# Patient Record
Sex: Male | Born: 1965 | Hispanic: No | Smoking: Former smoker
Health system: Southern US, Community
[De-identification: ages and names within clinical notes are randomized; demographics above are authoritative.]

## PROBLEM LIST (undated history)

## (undated) DIAGNOSIS — R531 Weakness: Secondary | ICD-10-CM

## (undated) DIAGNOSIS — R262 Difficulty in walking, not elsewhere classified: Secondary | ICD-10-CM

## (undated) DIAGNOSIS — I10 Essential (primary) hypertension: Secondary | ICD-10-CM

## (undated) DIAGNOSIS — E785 Hyperlipidemia, unspecified: Secondary | ICD-10-CM

## (undated) DIAGNOSIS — R7303 Prediabetes: Secondary | ICD-10-CM

## (undated) DIAGNOSIS — I639 Cerebral infarction, unspecified: Secondary | ICD-10-CM

## (undated) HISTORY — DX: Hyperlipidemia, unspecified: E78.5

## (undated) HISTORY — DX: Weakness: R53.1

## (undated) HISTORY — DX: Cerebral infarction, unspecified: I63.9

## (undated) HISTORY — DX: Difficulty in walking, not elsewhere classified: R26.2

## (undated) HISTORY — PX: NO PAST SURGERIES: SHX2092

## (undated) HISTORY — DX: Essential (primary) hypertension: I10

---

## 2011-01-03 ENCOUNTER — Emergency Department (HOSPITAL_COMMUNITY)
Admission: EM | Admit: 2011-01-03 | Discharge: 2011-01-03 | Disposition: A | Discharge status: Home / Self Care | Payer: No Typology Code available for payment source | Attending: Emergency Medicine | Admitting: Emergency Medicine

## 2011-01-03 ENCOUNTER — Emergency Department (HOSPITAL_COMMUNITY): Payer: No Typology Code available for payment source

## 2011-01-03 DIAGNOSIS — S93609A Unspecified sprain of unspecified foot, initial encounter: Secondary | ICD-10-CM | POA: Insufficient documentation

## 2014-04-18 DIAGNOSIS — I639 Cerebral infarction, unspecified: Secondary | ICD-10-CM

## 2014-04-18 HISTORY — DX: Cerebral infarction, unspecified: I63.9

## 2015-10-25 ENCOUNTER — Inpatient Hospital Stay (HOSPITAL_COMMUNITY): Payer: Self-pay

## 2015-10-25 ENCOUNTER — Encounter (HOSPITAL_COMMUNITY): Payer: Self-pay | Admitting: Oncology

## 2015-10-25 ENCOUNTER — Inpatient Hospital Stay (HOSPITAL_COMMUNITY)
Admission: EM | Admit: 2015-10-25 | Discharge: 2015-10-27 | DRG: 065 | Disposition: A | Discharge status: Rehabilitation Facility | Payer: Self-pay | Attending: Internal Medicine | Admitting: Internal Medicine

## 2015-10-25 ENCOUNTER — Emergency Department (HOSPITAL_COMMUNITY): Payer: Self-pay

## 2015-10-25 DIAGNOSIS — E669 Obesity, unspecified: Secondary | ICD-10-CM | POA: Diagnosis present

## 2015-10-25 DIAGNOSIS — I10 Essential (primary) hypertension: Secondary | ICD-10-CM

## 2015-10-25 DIAGNOSIS — I639 Cerebral infarction, unspecified: Secondary | ICD-10-CM | POA: Insufficient documentation

## 2015-10-25 DIAGNOSIS — I63312 Cerebral infarction due to thrombosis of left middle cerebral artery: Secondary | ICD-10-CM

## 2015-10-25 DIAGNOSIS — R471 Dysarthria and anarthria: Secondary | ICD-10-CM | POA: Insufficient documentation

## 2015-10-25 DIAGNOSIS — Z8249 Family history of ischemic heart disease and other diseases of the circulatory system: Secondary | ICD-10-CM

## 2015-10-25 DIAGNOSIS — R7303 Prediabetes: Secondary | ICD-10-CM | POA: Insufficient documentation

## 2015-10-25 DIAGNOSIS — R29707 NIHSS score 7: Secondary | ICD-10-CM | POA: Diagnosis not present

## 2015-10-25 DIAGNOSIS — K5900 Constipation, unspecified: Secondary | ICD-10-CM | POA: Diagnosis present

## 2015-10-25 DIAGNOSIS — Z6831 Body mass index (BMI) 31.0-31.9, adult: Secondary | ICD-10-CM

## 2015-10-25 DIAGNOSIS — I6350 Cerebral infarction due to unspecified occlusion or stenosis of unspecified cerebral artery: Secondary | ICD-10-CM

## 2015-10-25 DIAGNOSIS — R29703 NIHSS score 3: Secondary | ICD-10-CM | POA: Diagnosis present

## 2015-10-25 DIAGNOSIS — R29810 Facial weakness: Secondary | ICD-10-CM | POA: Diagnosis present

## 2015-10-25 DIAGNOSIS — R7302 Impaired glucose tolerance (oral): Secondary | ICD-10-CM | POA: Diagnosis present

## 2015-10-25 DIAGNOSIS — I638 Other cerebral infarction: Principal | ICD-10-CM | POA: Diagnosis present

## 2015-10-25 DIAGNOSIS — E785 Hyperlipidemia, unspecified: Secondary | ICD-10-CM

## 2015-10-25 DIAGNOSIS — Z823 Family history of stroke: Secondary | ICD-10-CM

## 2015-10-25 DIAGNOSIS — G8191 Hemiplegia, unspecified affecting right dominant side: Secondary | ICD-10-CM | POA: Diagnosis present

## 2015-10-25 LAB — I-STAT CHEM 8, ED
BUN: 24 mg/dL — ABNORMAL HIGH (ref 6–20)
CHLORIDE: 104 mmol/L (ref 101–111)
Calcium, Ion: 1.13 mmol/L (ref 1.13–1.30)
Creatinine, Ser: 1.1 mg/dL (ref 0.61–1.24)
Glucose, Bld: 103 mg/dL — ABNORMAL HIGH (ref 65–99)
HEMATOCRIT: 49 % (ref 39.0–52.0)
HEMOGLOBIN: 16.7 g/dL (ref 13.0–17.0)
POTASSIUM: 3.8 mmol/L (ref 3.5–5.1)
Sodium: 140 mmol/L (ref 135–145)
TCO2: 25 mmol/L (ref 0–100)

## 2015-10-25 LAB — COMPREHENSIVE METABOLIC PANEL
ALBUMIN: 4.7 g/dL (ref 3.5–5.0)
ALK PHOS: 66 U/L (ref 38–126)
ALT: 45 U/L (ref 17–63)
ANION GAP: 7 (ref 5–15)
AST: 25 U/L (ref 15–41)
BUN: 23 mg/dL — ABNORMAL HIGH (ref 6–20)
CHLORIDE: 105 mmol/L (ref 101–111)
CO2: 26 mmol/L (ref 22–32)
CREATININE: 0.91 mg/dL (ref 0.61–1.24)
Calcium: 9.2 mg/dL (ref 8.9–10.3)
GFR calc non Af Amer: >60 mL/min (ref >60)
GLUCOSE: 105 mg/dL — AB (ref 65–99)
Potassium: 3.8 mmol/L (ref 3.5–5.1)
SODIUM: 138 mmol/L (ref 135–145)
Total Bilirubin: 0.7 mg/dL (ref 0.3–1.2)
Total Protein: 8 g/dL (ref 6.5–8.1)

## 2015-10-25 LAB — CBG MONITORING, ED: GLUCOSE-CAPILLARY: 99 mg/dL (ref 65–99)

## 2015-10-25 LAB — CBC
HCT: 45.2 % (ref 39.0–52.0)
Hemoglobin: 16.6 g/dL (ref 13.0–17.0)
MCH: 29.9 pg (ref 26.0–34.0)
MCHC: 36.7 g/dL — ABNORMAL HIGH (ref 30.0–36.0)
MCV: 81.3 fL (ref 78.0–100.0)
PLATELETS: 197 10*3/uL (ref 150–400)
RBC: 5.56 MIL/uL (ref 4.22–5.81)
RDW: 12.9 % (ref 11.5–15.5)
WBC: 8.5 10*3/uL (ref 4.0–10.5)

## 2015-10-25 LAB — DIFFERENTIAL
BASOS PCT: 0 %
Basophils Absolute: 0 10*3/uL (ref 0.0–0.1)
Eosinophils Absolute: 0.4 10*3/uL (ref 0.0–0.7)
Eosinophils Relative: 5 %
LYMPHS PCT: 24 %
Lymphs Abs: 2 10*3/uL (ref 0.7–4.0)
MONO ABS: 0.6 10*3/uL (ref 0.1–1.0)
Monocytes Relative: 7 %
NEUTROS ABS: 5.5 10*3/uL (ref 1.7–7.7)
NEUTROS PCT: 65 %

## 2015-10-25 LAB — APTT: APTT: 34 s (ref 24–37)

## 2015-10-25 LAB — RAPID URINE DRUG SCREEN, HOSP PERFORMED
AMPHETAMINES: NOT DETECTED
BARBITURATES: NOT DETECTED
BENZODIAZEPINES: NOT DETECTED
COCAINE: NOT DETECTED
Opiates: NOT DETECTED
TETRAHYDROCANNABINOL: NOT DETECTED

## 2015-10-25 LAB — LIPID PANEL
CHOLESTEROL: 269 mg/dL — AB (ref 0–200)
HDL: 39 mg/dL — ABNORMAL LOW (ref >40)
LDL CALC: 176 mg/dL — AB (ref 0–99)
Total CHOL/HDL Ratio: 6.9 RATIO
Triglycerides: 269 mg/dL — ABNORMAL HIGH (ref <150)
VLDL: 54 mg/dL — AB (ref 0–40)

## 2015-10-25 LAB — PROTIME-INR
INR: 0.93 (ref 0.00–1.49)
Prothrombin Time: 12.7 seconds (ref 11.6–15.2)

## 2015-10-25 LAB — I-STAT TROPONIN, ED: Troponin i, poc: 0 ng/mL (ref 0.00–0.08)

## 2015-10-25 MED ORDER — SENNOSIDES-DOCUSATE SODIUM 8.6-50 MG PO TABS: 1.0000 | ORAL_TABLET | Freq: Every evening | ORAL | Status: DC | PRN

## 2015-10-25 MED ORDER — LABETALOL HCL 5 MG/ML IV SOLN
20.0000 mg | Freq: Once | INTRAVENOUS | Status: AC
Start: 2015-10-25 — End: 2015-10-25
  Administered 2015-10-25: 20 mg via INTRAVENOUS
  Filled 2015-10-25: qty 4

## 2015-10-25 MED ORDER — ASPIRIN 325 MG PO TABS
325.0000 mg | ORAL_TABLET | Freq: Every day | ORAL | Status: DC
  Administered 2015-10-25 – 2015-10-27 (×3): 325 mg via ORAL
  Filled 2015-10-25 (×3): qty 1

## 2015-10-25 MED ORDER — LABETALOL HCL 5 MG/ML IV SOLN: 10.0000 mg | Freq: Four times a day (QID) | INTRAVENOUS | Status: DC | PRN

## 2015-10-25 MED ORDER — ATORVASTATIN CALCIUM 80 MG PO TABS
80.0000 mg | ORAL_TABLET | Freq: Every day | ORAL | Status: DC
  Administered 2015-10-26: 80 mg via ORAL
  Filled 2015-10-25: qty 1

## 2015-10-25 MED ORDER — ASPIRIN 300 MG RE SUPP: 300.0000 mg | Freq: Every day | RECTAL | Status: DC

## 2015-10-25 MED ORDER — ENOXAPARIN SODIUM 40 MG/0.4ML ~~LOC~~ SOLN
40.0000 mg | SUBCUTANEOUS | Status: DC
  Administered 2015-10-25 – 2015-10-27 (×3): 40 mg via SUBCUTANEOUS
  Filled 2015-10-25 (×3): qty 0.4

## 2015-10-25 MED ORDER — STROKE: EARLY STAGES OF RECOVERY BOOK
Freq: Once | Status: AC
  Administered 2015-10-26: 13:00:00
  Filled 2015-10-25: qty 1

## 2015-10-25 NOTE — Progress Notes (Signed)
PROGRESS NOTE  Brad Tran ZOX:[id number] DOB: [birth year] DOA: 10/25/2015 PCP: No primary care provider on file.  Brief History:  50 year old male with a history of hypertension presented with acute onset of gait instability, slurred speech and right-sided weakness, upper greater than lower extremity. The patient woke up around 11 PM on 10/24/2015 when he developed the above symptoms. The patient normally works third shift and gets up around that period of time. The patient went to work or his coworkers noted worsening symptoms. As a result, the patient was brought to emergency department for further evaluation. Neurology was consulted, and full stroke workup was undertaken.  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 -MRA brain--no large vessel occlusions -Carotid Duplex--no hemodynamically significant stenosis -Echo--pending  -LDL--176--start atorvastatin 80 mg daily -HbA1C--pending -continue aspirin  HTN -allow for permissive HTN -hydralazine for SBP >210 DBP >120 -UDS neg  Hyperlipidemia -LDL--176--start atorvastatin 80 mg daily   Disposition Plan:   Discharge in 1-2 days  Family Communication:   No Family at bedside  Consultants:  Neurology  Code Status:  FULL   DVT Prophylaxis:  [place] Lovenox  Procedures: As Listed in Progress Note Above  Antibiotics: None    Subjective: Patient states that his right upper extremity and resolution of her weakness is about the same ingestion. He feels his speech is a little bit better. He denies any headache, fever, chills, has become hoarse breath, nausea, vomiting, diarrhea. No abdominal pain. No hematochezia melena.  Objective: Filed Vitals:   10/25/15 0343 10/25/15 0551 10/25/15 0622 10/25/15 1200  BP: 198/117 180/102 167/107 168/105  Pulse: 85 86 84 77  Temp: 98.3 F  (36.8 C)  98.4 F (36.9 C)   TempSrc: Oral  Oral   Resp: 20 18 16 16   Height:   5\' 3"  (1.6 m)   Weight:   79.742 kg (175 lb 12.8 oz)   SpO2: 100% 97% 99% 97%    Intake/Output Summary (Last 24 hours) at 10/25/15 1350 Last data filed at 10/25/15 0924  Gross per 24 hour  Intake      0 ml  Output    500 ml  Net   -500 ml   Weight change:  Exam:   General:  Pt is alert, follows commands appropriately, not in acute distress  HEENT: No icterus, No thrush, No neck mass, [place]/AT  Cardiovascular: RRR, S1/S2, no rubs, no gallops  Respiratory: CTA bilaterally, no wheezing, no crackles, no rhonchi  Abdomen: Soft/+BS, non tender, non distended, no guarding  Extremities: trace LE edema, No lymphangitis, No petechiae, No rashes, no synovitis   Data Reviewed: I have personally reviewed following labs and imaging studies Basic Metabolic Panel:  Recent Labs Lab 10/25/15 0127 10/25/15 0140  NA 138 140  K 3.8 3.8  CL 105 104  CO2 26  --   GLUCOSE 105* 103*  BUN 23* 24*  CREATININE 0.91 1.10  CALCIUM 9.2  --    Liver Function Tests:  Recent Labs Lab 10/25/15 0127  AST 25  ALT 45  ALKPHOS 66  BILITOT 0.7  PROT 8.0  ALBUMIN 4.7   No results for input(s): LIPASE, AMYLASE in the last 168 hours. No results for input(s): AMMONIA in the last 168 hours. Coagulation Profile:  Recent Labs Lab 10/25/15 0127  INR 0.93   CBC:  Recent Labs Lab 10/25/15 0127  10/25/15 0140  WBC 8.5  --   NEUTROABS 5.5  --   HGB 16.6 16.7  HCT 45.2 49.0  MCV 81.3  --   PLT 197  --    Cardiac Enzymes: No results for input(s): CKTOTAL, CKMB, CKMBINDEX, TROPONINI in the last 168 hours. BNP: Invalid input(s): POCBNP CBG:  Recent Labs Lab 10/25/15 0128  GLUCAP 99   HbA1C: No results for input(s): HGBA1C in the last 72 hours. Urine analysis: No results found for: COLORURINE, APPEARANCEUR, LABSPEC, PHURINE, GLUCOSEU, HGBUR, BILIRUBINUR, KETONESUR, PROTEINUR, UROBILINOGEN, NITRITE,  LEUKOCYTESUR Sepsis Labs: @LABRCNTIP (procalcitonin:4,lacticidven:4) )No results found for this or any previous visit (from the past 240 hour(s)).   Scheduled Meds: .  stroke: mapping our early stages of recovery book   Does not apply Once  . aspirin  300 mg Rectal Daily   Or  . aspirin  325 mg Oral Daily  . atorvastatin  80 mg Oral q1800  . enoxaparin (LOVENOX) injection  40 mg Subcutaneous Q24H   Continuous Infusions:   Procedures/Studies: Mr Brain Wo Contrast  10/25/2015  CLINICAL DATA:  Dysarthria, difficulty walking beginning today. Feeling unwell. Assess ischemic stroke. EXAM: MRI HEAD WITHOUT CONTRAST MRA HEAD WITHOUT CONTRAST TECHNIQUE: Multiplanar, multiecho pulse sequences of the brain and surrounding structures were obtained without intravenous contrast. Angiographic images of the head were obtained using MRA technique without contrast. COMPARISON:  None. CT HEAD October 25, 2015 at 0136 hours FINDINGS: MRI HEAD FINDINGS INTRACRANIAL CONTENTS: 11 x 32 mm (transverse by AP) reduced diffusion LEFT posterior caudate body and tail and, posterior LEFT putaminal with corresponding low ADC values. No susceptibility artifact to suggest hemorrhage. The ventricles and sulci are normal for patient's age. 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 No suspicious parenchymal signal, masses or mass effect. No abnormal extra-axial fluid collections. No extra-axial masses though, contrast enhanced sequences would be more sensitive. Normal major intracranial vascular flow voids present at skull base. ORBITS: The included ocular globes and orbital contents are non-suspicious. SINUSES: RIGHT maxillary mucosal retention cyst. Minimal paranasal sinus mucosal thickening. Mastoid air cells are well aerated. SKULL/SOFT TISSUES: No abnormal sellar expansion. No suspicious calvarial bone marrow signal. Craniocervical junction maintained. MRA HEAD FINDINGS ANTERIOR CIRCULATION: Normal flow related enhancement of the included  cervical, petrous, cavernous and supraclinoid internal carotid arteries. Patent anterior communicating artery. Bilateral anterior cerebral arteries arise from RIGHT A1-2 junction with tiny LEFT A1 segment. No large vessel occlusion, high-grade stenosis, aneurysm. Mild luminal irregularity anterior and middle cerebral arteries. POSTERIOR CIRCULATION: Codominant vertebral artery's. Basilar artery is patent, with normal flow related enhancement of the main branch vessels. Fenestrated proximal basilar artery. Fetal origin RIGHT posterior cerebral artery. Robust LEFT posterior communicating artery. Normal flow related enhancement of the posterior cerebral arteries. No large vessel occlusion, high-grade stenosis, aneurysm. Mild luminal irregularity of the posterior cerebral arteries. IMPRESSION: MRI HEAD: Acute LEFT basal ganglia nonhemorrhagic infarct. 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 MRA HEAD: No emergent large vessel occlusion or severe stenosis. Mild luminal irregularity of the cerebral arteries compatible with atherosclerosis. Electronically Signed   By: Awilda Metroourtnay  Bloomer M.D.   On: 10/25/2015 06:03   Mr Maxine GlennMra Head/brain Wo Cm  10/25/2015  CLINICAL DATA:  Dysarthria, difficulty walking beginning today. Feeling unwell. Assess ischemic stroke. EXAM: MRI HEAD WITHOUT CONTRAST MRA HEAD WITHOUT CONTRAST TECHNIQUE: Multiplanar, multiecho pulse sequences of the brain and surrounding structures were obtained without intravenous contrast. Angiographic images of the head were obtained using MRA technique without contrast. COMPARISON:  None. CT HEAD October 25, 2015  at 0136 hours FINDINGS: MRI HEAD FINDINGS INTRACRANIAL CONTENTS: 11 x 32 mm (transverse by AP) reduced diffusion LEFT posterior caudate body and tail and, posterior LEFT putaminal with corresponding low ADC values. No susceptibility artifact to suggest hemorrhage. The ventricles and sulci are normal for patient's age. 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 No suspicious  parenchymal signal, masses or mass effect. No abnormal extra-axial fluid collections. No extra-axial masses though, contrast enhanced sequences would be more sensitive. Normal major intracranial vascular flow voids present at skull base. ORBITS: The included ocular globes and orbital contents are non-suspicious. SINUSES: RIGHT maxillary mucosal retention cyst. Minimal paranasal sinus mucosal thickening. Mastoid air cells are well aerated. SKULL/SOFT TISSUES: No abnormal sellar expansion. No suspicious calvarial bone marrow signal. Craniocervical junction maintained. MRA HEAD FINDINGS ANTERIOR CIRCULATION: Normal flow related enhancement of the included cervical, petrous, cavernous and supraclinoid internal carotid arteries. Patent anterior communicating artery. Bilateral anterior cerebral arteries arise from RIGHT A1-2 junction with tiny LEFT A1 segment. No large vessel occlusion, high-grade stenosis, aneurysm. Mild luminal irregularity anterior and middle cerebral arteries. POSTERIOR CIRCULATION: Codominant vertebral artery's. Basilar artery is patent, with normal flow related enhancement of the main branch vessels. Fenestrated proximal basilar artery. Fetal origin RIGHT posterior cerebral artery. Robust LEFT posterior communicating artery. Normal flow related enhancement of the posterior cerebral arteries. No large vessel occlusion, high-grade stenosis, aneurysm. Mild luminal irregularity of the posterior cerebral arteries. IMPRESSION: MRI HEAD: Acute LEFT basal ganglia nonhemorrhagic infarct. 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 MRA HEAD: No emergent large vessel occlusion or severe stenosis. Mild luminal irregularity of the cerebral arteries compatible with atherosclerosis. Electronically Signed   By: Awilda Metro M.D.   On: 10/25/2015 06:03   Ct Head Code Stroke W/o Cm  10/25/2015  CLINICAL DATA:  Acute onset of slurred speech. Code stroke. Initial encounter. EXAM: CT HEAD WITHOUT CONTRAST TECHNIQUE:  Contiguous axial images were obtained from the base of the skull through the vertex without intravenous contrast. COMPARISON:  None. FINDINGS: There is no evidence of acute infarction, mass lesion, or intra- or extra-axial hemorrhage on CT. A small chronic infarct is noted at the left thalamus. The posterior fossa, including the cerebellum, brainstem and fourth ventricle, is within normal limits. The third and lateral ventricles are unremarkable in appearance. The cerebral hemispheres are symmetric in appearance, with normal gray-white differentiation. No mass effect or midline shift is seen. There is no evidence of fracture; visualized osseous structures are unremarkable in appearance. The orbits are within normal limits. A small mucus retention cyst or polyp is noted at the right maxillary sinus. The remaining paranasal sinuses and mastoid air cells are well-aerated. No significant soft tissue abnormalities are seen. IMPRESSION: 1. No acute intracranial pathology seen on CT. 2. 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 3. Small mucus retention cyst or polyp at the right maxillary sinus. These results were called by telephone at the time of interpretation on 10/25/2015 at 1:40 am to Dr. Azalia Bilis, who verbally acknowledged these results. Electronically Signed   By: Roanna Raider M.D.   On: 10/25/2015 01:40    [place]ace [place], DO  [facility] Pager [phone]  If 7PM-7AM, please contact night-coverage [email or web address] Password [email or web address] 10/25/2015, 1:50 PM   LOS: 0 days

## 2015-10-25 NOTE — ED Provider Notes (Signed)
  By signing my name below, I, Suzan SlickAshley N. Elon SpannerLeger, attest that this documentation has been prepared under the direction and in the presence of Arby BarretteMarcy Willodene Stallings, MD.  Electronically Signed: Suzan SlickAshley N. Elon SpannerLeger, ED Scribe. 10/25/2015. 4:04 AM.   HPI Comments: Brad BrooksChhoun Tran is a 50 y.o. male without any pertinent past medical history who presents to the Emergency Department complaining of slurred/slowed speech and gait difficulty. Pt went to work as normal but noted symptoms after waking from sleep at 11pm.Pt denies any fever, chills, HA, or weakness. He is not a smoker. Denies any illicit drug. Last seen normal at 3:00 PM.  Patient is alert and interactive. No somnolence. Heart is regular without rub murmur gallop. Lungs are clear. Patient does have slurred speech and right facial droop with right grip strength decreased relative to the left and right lower extremity weakness relative to left. Patient's mental status is clear. He follows commands appropriately. He is able to elevate his right lower extremity off of the bed but cannot hold or resist downward pressure.   [facility] and [facility] and neurology consultation.  Dr. Amada JupiterKirkpatrick has seen the patient and determined symptoms are very consistent with CVA. Patient will have permissive hypertension up to 220\120. Last known well time was 3 PM. Patient was out of window for thrombolytics.  Consultation: Dr. Jesusita Okaan forth of [facility] for admission.  PCP: No primary care provider on file.        Arby BarretteMarcy Amber Williard, MD 10/25/15 [phone]

## 2015-10-25 NOTE — Progress Notes (Signed)
IP Rehab consult has been ordered.  Admissions coordinator will follow up tomorrow after consult is completed.    Weldon PickingSusan Tersa Fotopoulos PT Inpatient Rehab Admissions Coordinator Cell [phone] Office [phone]

## 2015-10-25 NOTE — Progress Notes (Signed)
STROKE TEAM PROGRESS NOTE   HISTORY OF PRESENT ILLNESS (per record) Brad Tran is a 50 y.o. male who presents with dysarthria and trouble walking that started earlier today. He was asleep from 3 until around 11pm(works third shift) and when he awoke, he was not feeling well. He then noticed that he was slurring his words and falling and therefore came into the emergency room.   LKW: 3 pm tpa given?: no, out of window   SUBJECTIVE (INTERVAL HISTORY) No family is at the bedside.  Overall he feels his condition is unchanged. He denies PMH but he has not seen doctor for 8-10 years. Denies smoking, but apparently has HTN and HLD.    OBJECTIVE Temp:  [98.3 F (36.8 C)-98.4 F (36.9 C)] 98.4 F (36.9 C) (07/09 0622) Pulse Rate:  [84-97] 84 (07/09 0622) Cardiac Rhythm:  [-]  Resp:  [15-20] 16 (07/09 0622) BP: (167-198)/(102-118) 167/107 mmHg (07/09 0622) SpO2:  [97 %-100 %] 99 % (07/09 0622) Weight:  [78.926 kg (174 lb)-79.742 kg (175 lb 12.8 oz)] 79.742 kg (175 lb 12.8 oz) (07/09 0622)  CBC:  Recent Labs Lab 10/25/15 0127 10/25/15 0140  WBC 8.5  --   NEUTROABS 5.5  --   HGB 16.6 16.7  HCT 45.2 49.0  MCV 81.3  --   PLT 197  --     Basic Metabolic Panel:  Recent Labs Lab 10/25/15 0127 10/25/15 0140  NA 138 140  K 3.8 3.8  CL 105 104  CO2 26  --   GLUCOSE 105* 103*  BUN 23* 24*  CREATININE 0.91 1.10  CALCIUM 9.2  --     Lipid Panel: No results found for: CHOL, TRIG, HDL, CHOLHDL, VLDL, LDLCALC HgbA1c: No results found for: HGBA1C Urine Drug Screen: No results found for: LABOPIA, COCAINSCRNUR, LABBENZ, AMPHETMU, THCU, LABBARB    IMAGING I have personally reviewed the radiological images below and agree with the radiology interpretations.  Mr Brad Tran Head/brain Wo Cm 10/25/2015   MRI HEAD:  Acute LEFT basal ganglia nonhemorrhagic infarct.  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  MRA HEAD:  No emergent large vessel occlusion or severe stenosis. Mild luminal irregularity  of the cerebral arteries compatible with atherosclerosis.   Ct Head W/o Cm 10/25/2015   1. No acute intracranial pathology seen on CT.  2. 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  3. Small mucus retention cyst or polyp at the right maxillary sinus.   CUS - There is no obvious evidence of hemodynamically significant internal carotid artery stenosis bilaterally. Vertebral arteries are patent with antegrade flow.  TTE - pending   PHYSICAL EXAM Temp:  [98.3 F (36.8 C)-98.4 F (36.9 C)] 98.4 F (36.9 C) (07/09 0622) Pulse Rate:  [74-97] 74 (07/09 1400) Resp:  [15-20] 16 (07/09 1200) BP: (157-198)/(102-118) 157/104 mmHg (07/09 1400) SpO2:  [95 %-100 %] 95 % (07/09 1400) Weight:  [174 lb (78.926 kg)-175 lb 12.8 oz (79.742 kg)] 175 lb 12.8 oz (79.742 kg) (07/09 0622)  General - Well nourished, well developed, in no apparent distress.  Ophthalmologic - Sharp disc margins OU.  Cardiovascular - Regular rate and rhythm.  Mental Status -  Level of arousal and orientation to time, place, and person were intact. Language including expression, naming, repetition, comprehension was assessed and found intact. Fund of Knowledge was assessed and was intact.  Cranial Nerves II - XII - II - Visual field intact OU. III, IV, VI - Extraocular movements intact. V - Facial sensation intact bilaterally. VII -  right facial droop. VIII - Hearing & vestibular intact bilaterally. X - Palate elevates symmetrically, mild to moderate dysarthria. XI - Chin turning & shoulder shrug intact bilaterally. XII - Tongue protrusion intact.  Motor Strength - The patient's strength was normal in LUE and LLE, but 3/5 RUE and 3+/5 RLE and pronator drift was present on the left.  Bulk was normal and fasciculations were absent.   Motor Tone - Muscle tone was assessed at the neck and appendages and was normal.  Reflexes - The patient's reflexes were 1+ in all extremities and he had no pathological  reflexes.  Sensory - Light touch, temperature/pinprick were assessed and were symmetrical.    Coordination - The patient had normal movements in the left hand with no ataxia or dysmetria.  Tremor was absent.  Gait and Station - deferred due to safety concerns.   ASSESSMENT/PLAN Mr. Brad Tran is a 50 y.o. male with no significant past medical history but also not see doctor for years presenting with dysarthria, gait disturbance, and falls. He did not receive IV t-PA due to late presentation.  Stroke: left BG/CR large infarct secondary to small vessel disease. However, due to size, embolic stroke not able to completely ruled out at this time.  Resultant  Right facial droop, dysarthria, right hemiparesis  MRI - acute LEFT BG/CR large infarct with an old LEFT thalamus lacunar infarct.   MRA - no emergent large vessel occlusion or severe stenosis  Carotid Doppler - unremarkable  2D Echo pending  Due to large size of infarct, recommend 30 day cardiac event monitoring as outpt to rule out afib.  LDL 176  HgbA1c pending  VTE prophylaxis - Lovenox  Diet NPO time specified  No antithrombotic prior to admission, now on aspirin 325 mg daily. Continue ASA on discharge.  Patient counseled to be compliant with his antithrombotic medications  Ongoing aggressive stroke risk factor management  Therapy recommendations:  Pending  Disposition:  Pending  Hypertension  Elevated blood pressures, indicating poorly controlled BP at home overtime.  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 Please call with questions. Pt will follow up with Dr. Roda Shutters at [facility] in about 2 months. Thanks for the consult.  Marvel Plan, MD PhD Stroke Neurology 10/25/2015 5:54 PM   To contact Stroke Continuity provider, please refer to [email or web address]. After hours, contact General Neurology

## 2015-10-25 NOTE — Progress Notes (Signed)
SLP Cancellation Note  Patient Details Name: Brad Tran MRN: [id number] DOB: 05/06/1965   Cancelled treatment:       Reason Eval/Treat Not Completed: SLP screened, no needs identified, will sign off. Pt passed the RN stroke swallow screen. Will defer swallow eval, but follow up for cognitive linguistic eval as ordered.  Brad DittyBonnie Taylour Lietzke, MA CCC-SLP [phone]    Brad Tran, Brad Tran 10/25/2015, 8:09 AM

## 2015-10-25 NOTE — H&P (Signed)
History and Physical  Patient Name: Brad Tran     ZOX:[id number]    DOB: 1965-06-08    DOA: 10/25/2015 PCP: No primary care provider on file.   Patient coming from: Home     Chief Complaint: Right sided weakness and slurred speech  HPI: Brad Tran is a 50 y.o. male with no significant past medical history who presents with slurred speech and right sided weakness.  Patient speaks fluent English, no interpreter is needed. The patient was in his usual state of health until today, he typically sleeps during the day and works third shift but he couldn't sleep today. When he got up 11PM he noticed that his speech was slurred and he couldn't walk right. He went to work, but while he was there he and his coworkers knew something was wrong and so they brought him to the ER.  ED course: -Afebrile, tachycardic, hypertensive -Sodium 138, potassium 3.8, creatinine 0.9, WBC 8.5K, hemoglobin 16, coags normal, troponin negative -CT of the head with no acute intracranial process -ECG with sinus tachycardia and old inferior Q waves -He was evaluated by neurology and thought to have had a stroke and TRH were asked to admit     Review of Systems:  Pt complains of slurred speech, right-sided weakness, difficulty walking, right-sided facial droop, dizziness, malaise. All other systems negative except as just noted or noted in the history of present illness.  Past medical history: None, does not see a doctor   Past surgical history:  None  Social History: Patient lives with 2 roommates. He walks unassisted. 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 He does not smoke. He drinks alcohol occasionally. He is living [place] for 6 years.  He has a brother and sister who live locally.  No Known Allergies  Family history: family history includes Heart attack in his father; Stroke in his father.  Prior to Admission medications   None      Physical Exam: BP 198/117 mmHg  Pulse 85  Temp(Src) 98.3 F  (36.8 C) (Oral)  Resp 20  Ht  (1.651 m)  Wt 78.926 kg (174 lb)  BMI 28.96 kg/m2  SpO2 100% General appearance: Well-developed, adult male, alert and in no acute distress.   Eyes: Anicteric, conjunctiva pink, lids and lashes normal.     ENT: No nasal deformity, discharge, or epistaxis.  OP moist without lesions.   Lymph: No cervical, supraclavicular or axillary lymphadenopathy. Skin: Warm and dry.  No jaundice.  No suspicious rashes or lesions. Cardiac: RRR, nl S1-S2, no murmurs appreciated.  Capillary refill is brisk.  JVP normal.  No LE edema.  Radial and DP pulses 2+ and symmetric. no carotid bruits.  Respiratory: Normal respiratory rate and rhythm.  CTAB without rales or wheezes. GI: Abdomen soft without rigidity.  No TTP. No ascites, distension.   MSK: No deformities or effusions. Neuro: Pupils are 4 mm and reactive to 3 mm. Extraocular movements are intact, without nystagmus. Cranial nerve 5 is within normal limits. Cranial nerve 7 has right facial droop. Cranial nerve 8 is within normal limits. Not able to visualize palate. Cranial nerve 11 reveals sternocleidomastoid strong. Cranial nerve 12 is midline. 4/5 strength in right upper and lower extremities, 5/5 on LEFT.  Normal muscle bulk. The patient is oriented to time, place and person. Speech is mildly dysarthric. Naming is grossly intact. Recall, recent and remote, as well as general fund of knowledge seem within normal limits. Attention span and concentration are  within normal limits.   Psych: Behavior appropriate.  Affect normal.  No evidence of aural or visual hallucinations or delusions.       Labs on Admission:  I have personally reviewed following labs and imaging studies: CBC:  Recent Labs Lab 10/25/15 0127 10/25/15 0140  WBC 8.5  --   NEUTROABS 5.5  --   HGB 16.6 16.7  HCT 45.2 49.0  MCV 81.3  --   PLT 197  --    Basic Metabolic Panel:  Recent Labs Lab 10/25/15 0127 10/25/15 0140  NA 138 140    K 3.8 3.8  CL 105 104  CO2 26  --   GLUCOSE 105* 103*  BUN 23* 24*  CREATININE 0.91 1.10  CALCIUM 9.2  --    GFR: Estimated Creatinine Clearance: 77.8 mL/min (by C-G formula based on Cr of 1.1). Liver Function Tests:  Recent Labs Lab 10/25/15 0127  AST 25  ALT 45  ALKPHOS 66  BILITOT 0.7  PROT 8.0  ALBUMIN 4.7   Coagulation Profile:  Recent Labs Lab 10/25/15 0127  INR 0.93   CBG:  Recent Labs Lab 10/25/15 0128  GLUCAP 99     Radiological Exams on Admission: CT head without contrast 10/25/2015  Ct Head Code Stroke W/o Cm  10/25/2015  CLINICAL DATA:  Acute onset of slurred speech. Code stroke. Initial encounter. EXAM: CT HEAD WITHOUT CONTRAST TECHNIQUE: Contiguous axial images were obtained from the base of the skull through the vertex without intravenous contrast. COMPARISON:  None. FINDINGS: There is no evidence of acute infarction, mass lesion, or intra- or extra-axial hemorrhage on CT. A small chronic infarct is noted at the left thalamus. The posterior fossa, including the cerebellum, brainstem and fourth ventricle, is within normal limits. The third and lateral ventricles are unremarkable in appearance. The cerebral hemispheres are symmetric in appearance, with normal gray-white differentiation. No mass effect or midline shift is seen. There is no evidence of fracture; visualized osseous structures are unremarkable in appearance. The orbits are within normal limits. A small mucus retention cyst or polyp is noted at the right maxillary sinus. The remaining paranasal sinuses and mastoid air cells are well-aerated. No significant soft tissue abnormalities are seen. IMPRESSION: 1. No acute intracranial pathology seen on CT. 2. 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 3. Small mucus retention cyst or polyp at the right maxillary sinus. These results were called by telephone at the time of interpretation on 10/25/2015 at 1:40 am to Dr. Azalia BilisKEVIN CAMPOS, who verbally acknowledged  these results. Electronically Signed   By: Roanna RaiderJeffery  Chang M.D.   On: 10/25/2015 01:40     EKG: Independently reviewed. ECG showed sinus tachycardia with rate 99, QTC 447, early repolarization pattern and inferior Q waves.     Assessment/Plan 1. Acute Stroke vs TIA:  This is new.  MRI pending.  ABCD would be 5 if symptoms resolve. -Admit to telemetry -Neuro checks, NIHSS per protocol -Daily aspirin 325 mg -Permissive hypertension for now -Lipids, hemoglobin A1c -Carotid doppler, MRA per Neurology, ordered -Echocardiogram ordered -PT/OT/SLP consultation -Consult to Neurology, appreciate recommendations   2. HTN:  -Permissive hypertension for now -Labetalol PRN for SBP > 200 mmHg       DVT prophylaxis: Lovenox  Code Status: FULL  Family Communication: Sister and brother at bedside  Disposition Plan: Anticipate Stroke work up as above and consult to ancillary services.  Expect discharge within 2-3 days. Consults called: Neurology, Dr. Amada JupiterKirkpatrick has seen patient. Admission status: Telemetry, INPATIENT  status  Core measures: -VTE prophylaxis ordered at time of admission -Aspirin ordered at admission -Atrial fibrillation: not present -tPA not given because of outside tPA window -Dysphagia screen ordered in ER -Lipids ordered -PT eval ordered -Non-smoker    Medical decision making: Patient seen at 4:43 AM on 10/25/2015.  The patient was discussed with Dr. Donnald Garre. What exists of the patient's chart was reviewed in depth.  Clinical condition: stable.       Alberteen Sam [facility] Pager [phone]

## 2015-10-25 NOTE — Progress Notes (Signed)
OT Cancellation Note  Patient Details Name: Brad BrooksChhoun Goette MRN: [id number] DOB: 08/07/1965   Cancelled Treatment:    Reason Eval/Treat Not Completed: Patient at procedure or test/ unavailable  Pilar GrammesMathews, Tennyson Wacha H 10/25/2015, 12:29 PM

## 2015-10-25 NOTE — Evaluation (Signed)
Physical Therapy Evaluation Patient Details Name: Brad Tran MRN: [id number] DOB: [birth year] Today's Date: 10/25/2015   History of Present Illness    50 y.o. male who presents with dysarthria and trouble walking that started earlier today. He was asleep from 3 until around 11pm(works third shift) and when he awoke, he was not feeling well. He then noticed that he was slurring his words and falling and therefore came into the emergency room. 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  Pt would most benefit from intensive inpt rehab, therefore CIR consult placed to assess potential for transfer once medically ready.  In any case, pt will need postacute rehab to address deficits and return to prior functional status.  PT will initiate therapy in acute setting and assist with d/c recommendations as pt progresses.  See below for details of exam findings and refer to care plan for goals of care.      Follow Up Recommendations CIR    Equipment Recommendations   (hemiwalker?)    Recommendations for Other Services Rehab consult     Precautions / Restrictions Precautions Precautions: Fall Precaution Comments: bed/chair alarm Restrictions Other Position/Activity Restrictions: please prop R arm on pillow      Mobility  Bed Mobility Overal bed mobility: Needs Assistance Bed Mobility: Supine to Sit;Sit to Supine     Supine to sit: Min assist;HOB elevated Sit to supine: Mod assist   General bed mobility comments: to Left EOB, pt struggles to move right hemibody to bed's edge, needs cues to assist especially arm and to square self at edge; instructed how to assist leg onto bed surface with intact foot behind ankle, needs assist to complete and min  assist to scoot up in flat bed  Transfers Overall transfer level: Needs assistance Equipment used: None Transfers: Sit to/from [employer] Sit to Stand: Mod assist Stand pivot transfers: Mod assist       General transfer comment: needs assist to sequence sit>stand and cues to align with surface prior to sitting; able to bear weight on impaired leg, holds arm in flexion posture during transfer 90 degree to chair and back to bed; delayed stepping pattern with min assist to shift weight; LOB x1 returning to bed, assisted to bed surface without issue  Ambulation/Gait             General Gait Details: pivotal steps to/from chair only; pt fatigued  Stairs            Wheelchair Mobility    Modified Rankin (Stroke Patients Only) Modified Rankin (Stroke Patients Only) Pre-Morbid Rankin Score: No symptoms Modified Rankin: Moderately severe disability     Balance Overall balance assessment: Needs assistance Sitting-balance support: No upper extremity supported;Feet unsupported Sitting balance-Leahy Scale: Fair     Standing balance support: During functional activity;Single extremity supported Standing balance-Leahy Scale: Poor Standing balance comment: assumes but cannot maintain static standing over time; unable to shift weight or step without assist to prevent fall                             Pertinent Vitals/Pain Pain Assessment: No/denies pain    Home Living Family/patient expects to be discharged to:: Private residence Living Arrangements: Other relatives Available Help at  Discharge: Family;Friend(s);Available PRN/intermittently Type of Home: House Home Access: Level entry     Home Layout: One level Home Equipment: None      Prior Function Level of Independence: Independent         Comments: per pt, was driving     Hand Dominance        Extremity/Trunk Assessment   Upper Extremity Assessment: Defer to OT  evaluation;RUE deficits/detail RUE Deficits / Details: paretic, able to lift few inches off bed; instructed to use intact limb to assist and propped impaired limb on pillow for support/contracture prevention         Lower Extremity Assessment: RLE deficits/detail RLE Deficits / Details: generally 3-/5 throughout, able to flex/extend knee nearly full ROM vs gravity; able to PF/DF ankle and Great toe partial ROM; slow to initiate    Cervical / Trunk Assessment: Normal  Communication   Communication: No difficulties (understands English, speaks little -- ?aphasia?)  Cognition Arousal/Alertness: Awake/alert (sleepy by end, easily aroused) Behavior During Therapy: WFL for tasks assessed/performed Overall Cognitive Status: Difficult to assess                      General Comments  BP assessed in sitting: 189/113 (prior to activity) and in supine: 168/102 (after activity)    Exercises        Assessment/Plan    PT Assessment Patient needs continued PT services  PT Diagnosis Hemiplegia dominant side;Abnormality of gait   PT Problem List Impaired sensation;Decreased knowledge of use of DME;Decreased safety awareness;Decreased coordination;Decreased mobility;Decreased balance;Decreased activity tolerance;Decreased range of motion;Decreased strength;Impaired tone  PT Treatment Interventions Patient/family education;Neuromuscular re-education;Balance training;Therapeutic exercise;Therapeutic activities;Functional mobility training;Gait training;DME instruction   PT Goals (Current goals can be found in the Care Plan section) Acute Rehab PT Goals Patient Stated Goal: return to prior level of function PT Goal Formulation: With patient Time For Goal Achievement: 11/08/15 Potential to Achieve Goals: Good    Frequency Min 4X/week   Barriers to discharge Decreased caregiver support can family/friends provide 24 hour assist if needed?    Co-evaluation               End of  Session Equipment Utilized During Treatment: Gait belt Activity Tolerance: Patient tolerated treatment well;Patient limited by fatigue Patient left: in bed;with call bell/phone within reach;with bed alarm set Nurse Communication: Mobility status         Time: [id number] PT Time Calculation (min) (ACUTE ONLY): 29 min   Charges:   PT Evaluation $PT Eval Moderate Complexity: 1 Procedure     PT G Codes:        Dennis BastMartin, Shannan Slinker Galloway 10/25/2015, 12:13 PM

## 2015-10-25 NOTE — Progress Notes (Signed)
*  PRELIMINARY RESULTS* Vascular Ultrasound Carotid Duplex (Doppler) has been completed.  There is no obvious evidence of hemodynamically significant internal carotid artery stenosis bilaterally. Vertebral arteries are patent with antegrade flow.  10/25/2015 12:49 PM Gertie FeyMichelle Dymir Neeson, RVT, RDCS, RDMS

## 2015-10-25 NOTE — ED Provider Notes (Addendum)
CSN: [id number]     Arrival date & time 10/25/15  0116 History  By signing my name below, I, Bethel BornBritney McCollum, attest that this documentation has been prepared under the direction and in the presence of Azalia BilisKevin Zinedine Ellner, MD. Electronically Signed: Bethel BornBritney McCollum, ED Scribe. 10/25/2015. 1:33 AM  Chief Complaint  Patient presents with  . Code Stroke   The history is provided by the patient and a relative. No language interpreter was used.   Brad Tran is a 50 y.o. male who presents to the Emergency Department complaining of slurred and slowed speech and gait difficulty with onset near 11 PM last night, approximately 2.5 hours ago. He was last known well near 3 PM.The patient works 3rd shift and he went to bed near 3 PM feeling normal but woke up at 11 PM feeling unwell. A relative at bedside states that the pt was walking around watching him work after 11 PM and started to "wobble" and slur his speech. Pt denies headache and extremity weakness. He does not smoke. Pt uses alcohol occasionally but denies illicit drug use.   History reviewed. No pertinent past medical history. History reviewed. No pertinent past surgical history. No family history on file. Social History  Substance Use Topics  . Smoking status: Never Smoker   . Smokeless tobacco: Never Used  . Alcohol Use: No    Review of Systems  10 Systems reviewed and all are negative for acute change except as noted in the HPI.  Allergies  Review of patient's allergies indicates no known allergies.  Home Medications   Prior to Admission medications   Not on File   BP 195/118 mmHg  Pulse 97  Resp 18  SpO2 100% Physical Exam  Constitutional: He is oriented to person, place, and time. He appears well-developed and well-nourished.  HENT:  Head: Normocephalic and atraumatic.  Eyes: Pupils are equal, round, and reactive to light.  Cardiovascular: Regular rhythm.   Pulmonary/Chest: Effort normal.  Abdominal: Soft.  Musculoskeletal:  Normal range of motion.  Neurological: He is alert and oriented to person, place, and time.  5/5 strength in major muscle groups of  bilateral upper and lower extremities. Dysarthric speech. Mild right sided facial droop. Normal finger to nose bilaterally.   Nursing note and vitals reviewed.   ED Course  Procedures (including critical care time) DIAGNOSTIC STUDIES: Oxygen Saturation is 100% on RA,  normal by my interpretation.    COORDINATION OF CARE: 1:31 AM Discussed treatment plan which includes lab work and CT head  with pt at bedside and pt agreed to plan.  Labs Review Labs Reviewed  CBC - Abnormal; Notable for the following:    MCHC 36.7 (*)    All other components within normal limits  I-STAT CHEM 8, ED - Abnormal; Notable for the following:    BUN 24 (*)    Glucose, Bld 103 (*)    All other components within normal limits  DIFFERENTIAL  PROTIME-INR  APTT  COMPREHENSIVE METABOLIC PANEL  ETHANOL  I-STAT TROPOININ, ED  CBG MONITORING, ED    Imaging Review Ct Head Code Stroke W/o Cm  10/25/2015  CLINICAL DATA:  Acute onset of slurred speech. Code stroke. Initial encounter. EXAM: CT HEAD WITHOUT CONTRAST TECHNIQUE: Contiguous axial images were obtained from the base of the skull through the vertex without intravenous contrast. COMPARISON:  None. FINDINGS: There is no evidence of acute infarction, mass lesion, or intra- or extra-axial hemorrhage on CT. A small chronic infarct is noted at  the left thalamus. The posterior fossa, including the cerebellum, brainstem and fourth ventricle, is within normal limits. The third and lateral ventricles are unremarkable in appearance. The cerebral hemispheres are symmetric in appearance, with normal gray-white differentiation. No mass effect or midline shift is seen. There is no evidence of fracture; visualized osseous structures are unremarkable in appearance. The orbits are within normal limits. A small mucus retention cyst or polyp is noted  at the right maxillary sinus. The remaining paranasal sinuses and mastoid air cells are well-aerated. No significant soft tissue abnormalities are seen. IMPRESSION: 1. No acute intracranial pathology seen on CT. 2. 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 3. Small mucus retention cyst or polyp at the right maxillary sinus. These results were called by telephone at the time of interpretation on 10/25/2015 at 1:40 am to Dr. Azalia Bilis, who verbally acknowledged these results. Electronically Signed   By: Roanna Raider M.D.   On: 10/25/2015 01:40   I have personally reviewed and evaluated these images and lab results as part of my medical decision-making.   EKG Interpretation   Date/Time:  Sunday October 25 2015 01:26:29 EDT Ventricular Rate:  99 PR Interval:    QRS Duration: 102 QT Interval:  348 QTC Calculation: 447 R Axis:   -6 Text Interpretation:  Sinus rhythm Left ventricular hypertrophy Inferior  infarct, age indeterminate No old tracing to compare Confirmed by Harmoni Lucus   MD, Thierry Dobosz ([id number]) on 10/25/2015 1:33:20 AM      MDM   Final diagnoses:  Dysarthria    Last known well at 3 PM.  No indication for acute TPA at this time. I spoke with neurology who requests transfer to [facility] ER for MRI to further evaluate  Will Need MRI on arrival to [facility] ER  Transfer to [facility] ER. Pt stable for transfer and updated  I personally performed the services described in this documentation, which was scribed in my presence. The recorded information has been reviewed and is accurate.       Azalia Bilis, MD 10/25/15 1914  Azalia Bilis, MD 10/25/15 [id number]  Azalia Bilis, MD 10/25/15 0230

## 2015-10-25 NOTE — ED Notes (Signed)
Pt reports not feeling well.  States he is dizzy.  Slurred speech.  Right sided facial droop.  Difficulty w/ ambulation.

## 2015-10-25 NOTE — ED Notes (Signed)
Pt to CT

## 2015-10-25 NOTE — ED Notes (Signed)
[facility] in route

## 2015-10-25 NOTE — Consult Note (Addendum)
Neurology Consultation Reason for Consult: Dysarthria Referring Physician: Patria Maneampos, K  CC: dysarthria  History is obtained from:patient  HPI: Donita BrooksChhoun Farler is a 50 y.o. male who presents with dysarthria and trouble walking that started earlier today. He was asleep from 3 until around 11pm(works third shift) and when he awoke, he was not feeling well. He then noticed that he was slurring his words and falling and therefore came into the emergency room.    LKW: 3 pm tpa given?: no, out of window    ROS: A 14 point ROS was performed and is negative except as noted in the HPI.   PMHx: None(does not see doctors on a regular basis)   FHx: No history of stroke.    Social History:  reports that he has never smoked. He has never used smokeless tobacco. He reports that he does not drink alcohol or use illicit drugs.   Exam: Current vital signs: BP 198/117 mmHg  Pulse 85  Temp(Src) 98.3 F (36.8 C) (Oral)  Resp 20  Ht 5\' 5"  (1.651 m)  Wt 78.926 kg (174 lb)  BMI 28.96 kg/m2  SpO2 100% Vital signs in last 24 hours: Temp:  [98.3 F (36.8 C)] 98.3 F (36.8 C) (07/09 0343) Pulse Rate:  [85-97] 85 (07/09 0343) Resp:  [15-20] 20 (07/09 0343) BP: (182-198)/(107-118) 198/117 mmHg (07/09 0343) SpO2:  [97 %-100 %] 100 % (07/09 0343) Weight:  [78.926 kg (174 lb)] 78.926 kg (174 lb) (07/09 0137)   Physical Exam  Constitutional: Appears well-developed and well-nourished.  Psych: Affect appropriate to situation Eyes: No scleral injection HENT: No OP obstrucion Head: Normocephalic.  Cardiovascular: Normal rate and regular rhythm.  Respiratory: Effort normal and breath sounds normal to anterior ascultation GI: Soft.  No distension. There is no tenderness.  Skin: WDI  Neuro: Mental Status: Patient is awake, alert, oriented to person, place, month, year, and situation. Patient is able to give a clear and coherent history. No signs of aphasia or neglect Cranial Nerves: II: Visual  Fields are full. Pupils are equal, round, and reactive to light.   III,IV, VI: EOMI without ptosis or diploplia.  V: Facial sensation is symmetric to temperature VII: Facial movement is notable for right facial weakness VIII: hearing is intact to voice X: Uvula elevates symmetrically XI: Shoulder shrug is symmetric. XII: tongue is midline without atrophy or fasciculations.  Motor: Tone is normal. Bulk is normal. 5/5 strength was present on the left, he has mild 4/5 right arm and 4/5 right leg weakness.  Sensory: Sensation is dimnished in the right leg to LT Cerebellar: Ataxic right arm movements.    I have reviewed labs in epic and the results pertinent to this consultation are: CMP - unremarkable  I have reviewed the images obtained: CT head- negative.   Impression: 50 yo M with new onset right sided weakness and dysarthria consistent with ischemic infarct.   Recommendations: 1. HgbA1c, fasting lipid panel 2. MRI, MRA  of the brain without contrast 3. Frequent neuro checks 4. Echocardiogram 5. Carotid dopplers 6. Prophylactic therapy-Antiplatelet med: Aspirin - dose 325mg  PO or 300mg  PR 7. Risk factor modification 8. Telemetry monitoring 9. PT consult, OT consult, Speech consult 10. please page stroke NP  Or  PA  Or MD  M-F from 8am -4 pm starting 7/9 as this patient will be followed by the stroke team at this point.   You can look them up on [email or web address]      Ritta SlotMcNeill Nekita Pita, MD [facility]  [phone]  If 7pm- 7am, please page neurology on call as listed in [place].

## 2015-10-25 NOTE — ED Notes (Signed)
Pt brought in from [place] via [facility]. Per report pt symptoms onset 1630 10/24/15

## 2015-10-26 ENCOUNTER — Inpatient Hospital Stay (HOSPITAL_COMMUNITY): Payer: Self-pay

## 2015-10-26 DIAGNOSIS — R7303 Prediabetes: Secondary | ICD-10-CM

## 2015-10-26 DIAGNOSIS — I6789 Other cerebrovascular disease: Secondary | ICD-10-CM

## 2015-10-26 DIAGNOSIS — I63312 Cerebral infarction due to thrombosis of left middle cerebral artery: Secondary | ICD-10-CM

## 2015-10-26 DIAGNOSIS — I639 Cerebral infarction, unspecified: Secondary | ICD-10-CM

## 2015-10-26 DIAGNOSIS — I10 Essential (primary) hypertension: Secondary | ICD-10-CM

## 2015-10-26 LAB — ECHOCARDIOGRAM COMPLETE
HEIGHTINCHES: 63 in
WEIGHTICAEL: 2812.8 [oz_av]

## 2015-10-26 LAB — GLUCOSE, CAPILLARY
GLUCOSE-CAPILLARY: 106 mg/dL — AB (ref 65–99)
GLUCOSE-CAPILLARY: 101 mg/dL — AB (ref 65–99)
GLUCOSE-CAPILLARY: 108 mg/dL — AB (ref 65–99)
GLUCOSE-CAPILLARY: 118 mg/dL — AB (ref 65–99)
GLUCOSE-CAPILLARY: 121 mg/dL — AB (ref 65–99)
Glucose-Capillary: 116 mg/dL — ABNORMAL HIGH (ref 65–99)
Glucose-Capillary: 97 mg/dL (ref 65–99)
Glucose-Capillary: 129 mg/dL — ABNORMAL HIGH (ref 65–99)

## 2015-10-26 LAB — HEMOGLOBIN A1C
HEMOGLOBIN A1C: 5.7 % — AB (ref 4.8–5.6)
MEAN PLASMA GLUCOSE: 117 mg/dL

## 2015-10-26 MED ORDER — METOPROLOL TARTRATE 25 MG PO TABS: 25.0000 mg | ORAL_TABLET | Freq: Two times a day (BID) | ORAL | Status: DC

## 2015-10-26 MED ORDER — ASPIRIN 325 MG PO TABS: 325.0000 mg | ORAL_TABLET | Freq: Every day | ORAL | Status: DC

## 2015-10-26 MED ORDER — ATORVASTATIN CALCIUM 80 MG PO TABS: 80.0000 mg | ORAL_TABLET | Freq: Every day | ORAL | Status: DC

## 2015-10-26 NOTE — H&P (Signed)
Physical Medicine and Rehabilitation Admission H&P    Chief Complaint  Patient presents with  . Stroke Symptoms  : HPI: Brad Tran is a 50 y.o. right handed limited English-speaking male with unremarkable past medical history on no scheduled medications at time of admission. Per chart review patient lives with family. Independent prior to admission. Family works during the day. One level home. Presented 10/25/2015 with right-sided weakness and slurred speech. Urine drug screen negative. Cranial CT scan with no acute changes. 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 MRI of the brain showed acute left basal ganglia nonhemorrhagic infarct. MRA with no emergent large vessel occlusion or stenosis. Carotid Dopplers had no ICA stenosis. Echocardiogram is pending. Patient did not receive TPA. Neurology follow-up currently on aspirin for CVA prophylaxis. Subcutaneous Lovenox for DVT prophylaxis. Tolerating a regular diet. Physical therapy evaluation completed with recommendations of physical medicine rehabilitation consult.Patient was admitted for comprehensive rehabilitation program  ROS Constitutional:   Right sided weakness  HENT: Negative for hearing loss.  Eyes: Negative for blurred vision and double vision.  Respiratory: Negative for cough and shortness of breath.  Cardiovascular: Negative for chest pain, palpitations and leg swelling.  Gastrointestinal: Positive for constipation. Negative for nausea and vomiting.  Genitourinary: Negative for dysuria and hematuria.  Musculoskeletal: Positive for myalgias.  Skin: Negative for rash.  Neurological: Positive for speech change and focal weakness. Negative for seizures and headaches.  All other systems reviewed and are negative   History reviewed. No pertinent past medical history. History reviewed. No pertinent past surgical history. Family History  Problem Relation Age of Onset  . Stroke Father   . Heart attack Father     Social History:  reports that he has never smoked. He has never used smokeless tobacco. He reports that he drinks alcohol. He reports that he does not use illicit drugs. Allergies: No Known Allergies No prescriptions prior to admission    Home: Home Living Family/patient expects to be discharged to:: Private residence Living Arrangements: Other relatives Available Help at Discharge: Family, Friend(s), Available PRN/intermittently Type of Home: House Home Access: Level entry Home Layout: One level Home Equipment: None  Lives With: Spouse   Functional History: Prior Function Level of Independence: Independent Comments: per pt, was driving  Functional Status:  Mobility: Bed Mobility Overal bed mobility: Needs Assistance Bed Mobility: Supine to Sit, Sit to Supine Supine to sit: Min assist, HOB elevated Sit to supine: Mod assist General bed mobility comments: to Left EOB, pt struggles to move right hemibody to bed's edge, needs cues to assist especially arm and to square self at edge; instructed how to assist leg onto bed surface with intact foot behind ankle, needs assist to complete and min assist to scoot up in flat bed Transfers Overall transfer level: Needs assistance Equipment used: None Transfers: Sit to/from Stand, Stand Pivot Transfers Sit to Stand: Mod assist Stand pivot transfers: Mod assist General transfer comment: needs assist to sequence sit>stand and cues to align with surface prior to sitting; able to bear weight on impaired leg, holds arm in flexion posture during transfer 90 degree to chair and back to bed; delayed stepping pattern with min assist to shift weight; LOB x1 returning to bed, assisted to bed surface without issue Ambulation/Gait General Gait Details: pivotal steps to/from chair only; pt fatigued    ADL:    Cognition: Cognition Overall Cognitive Status: No family/caregiver present to determine baseline cognitive  functioning Arousal/Alertness: Awake/alert Orientation Level: Oriented to person, Oriented  to place, Oriented to time, Disoriented to situation Attention: Sustained Sustained Attention: Appears intact Memory: Impaired Memory Impairment: Retrieval deficit, Storage deficit, Decreased short term memory, Decreased recall of new information Decreased Short Term Memory: Verbal basic Awareness: Appears intact Problem Solving: Appears intact Safety/Judgment: Appears intact Cognition Arousal/Alertness: Awake/alert (sleepy by end, easily aroused) Behavior During Therapy: WFL for tasks assessed/performed Overall Cognitive Status: No family/caregiver present to determine baseline cognitive functioning Difficult to assess due to: Level of arousal  Physical Exam: Blood pressure 162/107, pulse 87, temperature 98.6 F (37 C), temperature source Oral, resp. rate 20, height '5\' 3"'$  (1.6 m), weight 79.742 kg (175 lb 12.8 oz), SpO2 98 %. Physical Exam Constitutional: He is oriented to person, place, and time. He appears well-developed and well-nourished.  HENT:  Head: Normocephalic and atraumatic.  Eyes: Conjunctivae and EOM are normal.  Neck: Normal range of motion. Neck supple. No thyromegaly present.  Cardiovascular: Normal rate and regular rhythm.  Respiratory: Effort normal and breath sounds normal. No respiratory distress.  GI: Soft. Bowel sounds are normal. He exhibits no distension.  Musculoskeletal: He exhibits no edema or tenderness.  Neurological: He is alert and oriented to person, place, and time.  Limited English-speaking male.  Follows simple commands. Sensation intact to light touch Motor: RUE: /0/5 RLE: hip flexion knee extension 4-/5, ankle dorsi/plantar flexion 0/5 LUE/LLE: 5/5 proximal to distal DTRs symmetric  Skin: Skin is warm and dry.  Psychiatric: He has a normal mood and affect. His behavior is normal  Results for orders placed or performed during the hospital encounter  of 10/25/15 (from the past 48 hour(s))  Protime-INR     Status: None   Collection Time: 10/25/15  1:27 AM  Result Value Ref Range   Prothrombin Time 12.7 11.6 - 15.2 seconds   INR 0.93 0.00 - 1.49  APTT     Status: None   Collection Time: 10/25/15  1:27 AM  Result Value Ref Range   aPTT 34 24 - 37 seconds  CBC     Status: Abnormal   Collection Time: 10/25/15  1:27 AM  Result Value Ref Range   WBC 8.5 4.0 - 10.5 K/uL   RBC 5.56 4.22 - 5.81 MIL/uL   Hemoglobin 16.6 13.0 - 17.0 g/dL   HCT 45.2 39.0 - 52.0 %   MCV 81.3 78.0 - 100.0 fL   MCH 29.9 26.0 - 34.0 pg   MCHC 36.7 (H) 30.0 - 36.0 g/dL   RDW 12.9 11.5 - 15.5 %   Platelets 197 150 - 400 K/uL  Differential     Status: None   Collection Time: 10/25/15  1:27 AM  Result Value Ref Range   Neutrophils Relative % 65 %   Neutro Abs 5.5 1.7 - 7.7 K/uL   Lymphocytes Relative 24 %   Lymphs Abs 2.0 0.7 - 4.0 K/uL   Monocytes Relative 7 %   Monocytes Absolute 0.6 0.1 - 1.0 K/uL   Eosinophils Relative 5 %   Eosinophils Absolute 0.4 0.0 - 0.7 K/uL   Basophils Relative 0 %   Basophils Absolute 0.0 0.0 - 0.1 K/uL  Comprehensive metabolic panel     Status: Abnormal   Collection Time: 10/25/15  1:27 AM  Result Value Ref Range   Sodium 138 135 - 145 mmol/L   Potassium 3.8 3.5 - 5.1 mmol/L   Chloride 105 101 - 111 mmol/L   CO2 26 22 - 32 mmol/L   Glucose, Bld 105 (H) 65 - 99 mg/dL  BUN 23 (H) 6 - 20 mg/dL   Creatinine, Ser 0.91 0.61 - 1.24 mg/dL   Calcium 9.2 8.9 - 10.3 mg/dL   Total Protein 8.0 6.5 - 8.1 g/dL   Albumin 4.7 3.5 - 5.0 g/dL   AST 25 15 - 41 U/L   ALT 45 17 - 63 U/L   Alkaline Phosphatase 66 38 - 126 U/L   Total Bilirubin 0.7 0.3 - 1.2 mg/dL   GFR calc non Af Amer >60 >60 mL/min   GFR calc Af Amer >60 >60 mL/min    Comment: (NOTE) The eGFR has been calculated using the CKD EPI equation. This calculation has not been validated in all clinical situations. eGFR's persistently <60 mL/min signify possible Chronic  Kidney Disease.    Anion gap 7 5 - 15  CBG monitoring, ED     Status: None   Collection Time: 10/25/15  1:28 AM  Result Value Ref Range   Glucose-Capillary 99 65 - 99 mg/dL  I-stat troponin, ED     Status: None   Collection Time: 10/25/15  1:38 AM  Result Value Ref Range   Troponin i, poc 0.00 0.00 - 0.08 ng/mL   Comment 3            Comment: Due to the release kinetics of cTnI, a negative result within the first hours of the onset of symptoms does not rule out myocardial infarction with certainty. If myocardial infarction is still suspected, repeat the test at appropriate intervals.   I-Stat Chem 8, ED     Status: Abnormal   Collection Time: 10/25/15  1:40 AM  Result Value Ref Range   Sodium 140 135 - 145 mmol/L   Potassium 3.8 3.5 - 5.1 mmol/L   Chloride 104 101 - 111 mmol/L   BUN 24 (H) 6 - 20 mg/dL   Creatinine, Ser 1.10 0.61 - 1.24 mg/dL   Glucose, Bld 103 (H) 65 - 99 mg/dL   Calcium, Ion 1.13 1.13 - 1.30 mmol/L   TCO2 25 0 - 100 mmol/L   Hemoglobin 16.7 13.0 - 17.0 g/dL   HCT 49.0 39.0 - 52.0 %  Glucose, capillary     Status: Abnormal   Collection Time: 10/25/15  6:50 AM  Result Value Ref Range   Glucose-Capillary 106 (H) 65 - 99 mg/dL   Comment 1 Notify RN    Comment 2 Document in Chart   Hemoglobin A1c     Status: Abnormal   Collection Time: 10/25/15  7:47 AM  Result Value Ref Range   Hgb A1c MFr Bld 5.7 (H) 4.8 - 5.6 %    Comment: (NOTE)         Pre-diabetes: 5.7 - 6.4         Diabetes: >6.4         Glycemic control for adults with diabetes: <7.0    Mean Plasma Glucose 117 mg/dL    Comment: (NOTE) Performed At: [facility] [place], [place] [id number] Lindon Romp MD AS:[id number]   Lipid panel     Status: Abnormal   Collection Time: 10/25/15  7:47 AM  Result Value Ref Range   Cholesterol 269 (H) 0 - 200 mg/dL   Triglycerides 269 (H) <150 mg/dL   HDL 39 (L) >40 mg/dL   Total CHOL/HDL Ratio 6.9 RATIO   VLDL 54 (H) 0 - 40  mg/dL   LDL Cholesterol 176 (H) 0 - 99 mg/dL    Comment:  Total Cholesterol/HDL:CHD Risk Coronary Heart Disease Risk Table                     Men   Women  1/2 Average Risk   3.4   3.3  Average Risk       5.0   4.4  2 X Average Risk   9.6   7.1  3 X Average Risk  23.4   11.0        Use the calculated Patient Ratio above and the CHD Risk Table to determine the patient's CHD Risk.        ATP III CLASSIFICATION (LDL):  <100     mg/dL   Optimal  100-129  mg/dL   Near or Above                    Optimal  130-159  mg/dL   Borderline  160-189  mg/dL   High  >190     mg/dL   Very High   Urine rapid drug screen (hosp performed)     Status: None   Collection Time: 10/25/15  9:31 AM  Result Value Ref Range   Opiates NONE DETECTED NONE DETECTED   Cocaine NONE DETECTED NONE DETECTED   Benzodiazepines NONE DETECTED NONE DETECTED   Amphetamines NONE DETECTED NONE DETECTED   Tetrahydrocannabinol NONE DETECTED NONE DETECTED   Barbiturates NONE DETECTED NONE DETECTED    Comment:        DRUG SCREEN FOR MEDICAL PURPOSES ONLY.  IF CONFIRMATION IS NEEDED FOR ANY PURPOSE, NOTIFY LAB WITHIN 5 DAYS.        LOWEST DETECTABLE LIMITS FOR URINE DRUG SCREEN Drug Class       Cutoff (ng/mL) Amphetamine      1000 Barbiturate      200 Benzodiazepine   390 Tricyclics       300 Opiates          300 Cocaine          300 THC              50   Glucose, capillary     Status: Abnormal   Collection Time: 10/25/15 10:58 AM  Result Value Ref Range   Glucose-Capillary 101 (H) 65 - 99 mg/dL  Glucose, capillary     Status: Abnormal   Collection Time: 10/25/15  4:12 PM  Result Value Ref Range   Glucose-Capillary 108 (H) 65 - 99 mg/dL  Glucose, capillary     Status: Abnormal   Collection Time: 10/25/15  9:15 PM  Result Value Ref Range   Glucose-Capillary 116 (H) 65 - 99 mg/dL   Comment 1 Notify RN    Comment 2 Document in Chart   Glucose, capillary     Status: None   Collection Time: 10/26/15   6:44 AM  Result Value Ref Range   Glucose-Capillary 97 65 - 99 mg/dL   Comment 1 Notify RN    Comment 2 Document in Chart   Glucose, capillary     Status: Abnormal   Collection Time: 10/26/15 11:33 AM  Result Value Ref Range   Glucose-Capillary 118 (H) 65 - 99 mg/dL   Comment 1 Notify RN    Comment 2 Document in Chart    Mr Brain Wo Contrast  10/25/2015  CLINICAL DATA:  Dysarthria, difficulty walking beginning today. Feeling unwell. Assess ischemic stroke. EXAM: MRI HEAD WITHOUT CONTRAST MRA HEAD WITHOUT CONTRAST TECHNIQUE: Multiplanar, multiecho pulse sequences of the brain and  surrounding structures were obtained without intravenous contrast. Angiographic images of the head were obtained using MRA technique without contrast. COMPARISON:  None. CT HEAD October 25, 2015 at 0136 hours FINDINGS: MRI HEAD FINDINGS INTRACRANIAL CONTENTS: 11 x 32 mm (transverse by AP) reduced diffusion LEFT posterior caudate body and tail and, posterior LEFT putaminal with corresponding low ADC values. No susceptibility artifact to suggest hemorrhage. The ventricles and sulci are normal for patient's age. 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 No suspicious parenchymal signal, masses or mass effect. No abnormal extra-axial fluid collections. No extra-axial masses though, contrast enhanced sequences would be more sensitive. Normal major intracranial vascular flow voids present at skull base. ORBITS: The included ocular globes and orbital contents are non-suspicious. SINUSES: RIGHT maxillary mucosal retention cyst. Minimal paranasal sinus mucosal thickening. Mastoid air cells are well aerated. SKULL/SOFT TISSUES: No abnormal sellar expansion. No suspicious calvarial bone marrow signal. Craniocervical junction maintained. MRA HEAD FINDINGS ANTERIOR CIRCULATION: Normal flow related enhancement of the included cervical, petrous, cavernous and supraclinoid internal carotid arteries. Patent anterior communicating artery. Bilateral anterior  cerebral arteries arise from RIGHT A1-2 junction with tiny LEFT A1 segment. No large vessel occlusion, high-grade stenosis, aneurysm. Mild luminal irregularity anterior and middle cerebral arteries. POSTERIOR CIRCULATION: Codominant vertebral artery's. Basilar artery is patent, with normal flow related enhancement of the main branch vessels. Fenestrated proximal basilar artery. Fetal origin RIGHT posterior cerebral artery. Robust LEFT posterior communicating artery. Normal flow related enhancement of the posterior cerebral arteries. No large vessel occlusion, high-grade stenosis, aneurysm. Mild luminal irregularity of the posterior cerebral arteries. IMPRESSION: MRI HEAD: Acute LEFT basal ganglia nonhemorrhagic infarct. 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 MRA HEAD: No emergent large vessel occlusion or severe stenosis. Mild luminal irregularity of the cerebral arteries compatible with atherosclerosis. Electronically Signed   By: Elon Alas M.D.   On: 10/25/2015 06:03   Mr Jodene Nam Head/brain Wo Cm  10/25/2015  CLINICAL DATA:  Dysarthria, difficulty walking beginning today. Feeling unwell. Assess ischemic stroke. EXAM: MRI HEAD WITHOUT CONTRAST MRA HEAD WITHOUT CONTRAST TECHNIQUE: Multiplanar, multiecho pulse sequences of the brain and surrounding structures were obtained without intravenous contrast. Angiographic images of the head were obtained using MRA technique without contrast. COMPARISON:  None. CT HEAD October 25, 2015 at 0136 hours FINDINGS: MRI HEAD FINDINGS INTRACRANIAL CONTENTS: 11 x 32 mm (transverse by AP) reduced diffusion LEFT posterior caudate body and tail and, posterior LEFT putaminal with corresponding low ADC values. No susceptibility artifact to suggest hemorrhage. The ventricles and sulci are normal for patient's age. 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 No suspicious parenchymal signal, masses or mass effect. No abnormal extra-axial fluid collections. No extra-axial masses though, contrast  enhanced sequences would be more sensitive. Normal major intracranial vascular flow voids present at skull base. ORBITS: The included ocular globes and orbital contents are non-suspicious. SINUSES: RIGHT maxillary mucosal retention cyst. Minimal paranasal sinus mucosal thickening. Mastoid air cells are well aerated. SKULL/SOFT TISSUES: No abnormal sellar expansion. No suspicious calvarial bone marrow signal. Craniocervical junction maintained. MRA HEAD FINDINGS ANTERIOR CIRCULATION: Normal flow related enhancement of the included cervical, petrous, cavernous and supraclinoid internal carotid arteries. Patent anterior communicating artery. Bilateral anterior cerebral arteries arise from RIGHT A1-2 junction with tiny LEFT A1 segment. No large vessel occlusion, high-grade stenosis, aneurysm. Mild luminal irregularity anterior and middle cerebral arteries. POSTERIOR CIRCULATION: Codominant vertebral artery's. Basilar artery is patent, with normal flow related enhancement of the main branch vessels. Fenestrated proximal basilar artery. Fetal origin RIGHT posterior cerebral artery. Robust LEFT posterior communicating  artery. Normal flow related enhancement of the posterior cerebral arteries. No large vessel occlusion, high-grade stenosis, aneurysm. Mild luminal irregularity of the posterior cerebral arteries. IMPRESSION: MRI HEAD: Acute LEFT basal ganglia nonhemorrhagic infarct. 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 MRA HEAD: No emergent large vessel occlusion or severe stenosis. Mild luminal irregularity of the cerebral arteries compatible with atherosclerosis. Electronically Signed   By: Elon Alas M.D.   On: 10/25/2015 06:03   Ct Head Code Stroke W/o Cm  10/25/2015  CLINICAL DATA:  Acute onset of slurred speech. Code stroke. Initial encounter. EXAM: CT HEAD WITHOUT CONTRAST TECHNIQUE: Contiguous axial images were obtained from the base of the skull through the vertex without intravenous contrast. COMPARISON:   None. FINDINGS: There is no evidence of acute infarction, mass lesion, or intra- or extra-axial hemorrhage on CT. A small chronic infarct is noted at the left thalamus. The posterior fossa, including the cerebellum, brainstem and fourth ventricle, is within normal limits. The third and lateral ventricles are unremarkable in appearance. The cerebral hemispheres are symmetric in appearance, with normal gray-white differentiation. No mass effect or midline shift is seen. There is no evidence of fracture; visualized osseous structures are unremarkable in appearance. The orbits are within normal limits. A small mucus retention cyst or polyp is noted at the right maxillary sinus. The remaining paranasal sinuses and mastoid air cells are well-aerated. No significant soft tissue abnormalities are seen. IMPRESSION: 1. No acute intracranial pathology seen on CT. 2. 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 3. Small mucus retention cyst or polyp at the right maxillary sinus. These results were called by telephone at the time of interpretation on 10/25/2015 at 1:40 am to Dr. Jola Schmidt, who verbally acknowledged these results. Electronically Signed   By: Garald Balding M.D.   On: 10/25/2015 01:40       Medical Problem List and Plan: 1.  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  DVT Prophylaxis/Anticoagulation: Subcutaneous Lovenox. Monitor for any bleeding episodes 3. Pain Management: Tylenol as needed 4. Hyperlipidemia. Lipitor 5. Neuropsych: This patient is capable of making decisions on his own behalf. 6. Skin/Wound Care: Routine skin checks 7. Fluids/Electrolytes/Nutrition: Routine I&O's with follow-up chemistries 8. HTN: Monitor 9. Prediabetes: Monitor  Post Admission Physician Evaluation: Functional deficits secondary  to left basal ganglia corona radiata infarct. 1. Patient is admitted to receive collaborative, interdisciplinary care between the physiatrist, rehab  nursing staff, and therapy team. 2. Patient's level of medical complexity and substantial therapy needs in context of that medical necessity cannot be provided at a lesser intensity of care such as a SNF. 3. Patient has experienced substantial functional loss from his/her baseline which was documented above under the "Functional History" and "Functional Status" headings.  Judging by the patient's diagnosis, physical exam, and functional history, the patient has potential for functional progress which will result in measurable gains while on inpatient rehab.  These gains will be of substantial and practical use upon discharge  in facilitating mobility and self-care at the household level. 4. Physiatrist will provide 24 hour management of medical needs as well as oversight of the therapy plan/treatment and provide guidance as appropriate regarding the interaction of the two. 5. 24 hour rehab nursing will assist with safety, disease management and patient education and help integrate therapy concepts, techniques,education, etc. 6. PT will assess and treat for/with: Lower extremity strength, range of motion, stamina, balance, functional mobility, safety, adaptive techniques and equipment, woundcare, coping skills, pain control, education.  Goals are: Supervision/Min A. 7. OT will assess and treat for/with: ADL's, functional mobility, safety, upper extremity strength, adaptive techniques and equipment, wound mgt, ego support, and community reintegration.   Goals are: Supervision/Min A. Therapy may proceed with showering this patient. 8. Case Management and Social Worker will assess and treat for psychological issues and discharge planning. 9. Team conference will be held weekly to assess progress toward goals and to determine barriers to discharge. 10. Patient will receive at least 3 hours of therapy per day at least 5 days per week. 11. ELOS: 16-19 days.       12. Prognosis:  good  Delice Lesch, MD   10/26/2015

## 2015-10-26 NOTE — Progress Notes (Signed)
PROGRESS NOTE  Brad Tran ZOX:[id number] DOB: 11/28/1965 DOA: 10/25/2015 PCP: No primary care provider on file.   Brief History:  50 year old male with a history of hypertension presented with acute onset of gait instability, slurred speech and right-sided weakness, upper greater than lower extremity. The patient woke up around 11 PM on 10/24/2015 when he developed the above symptoms. The patient normally works third shift and gets up around that period of time. The patient went to work or his coworkers noted worsening symptoms. As a result, the patient was brought to emergency department for further evaluation. Neurology was consulted, and full stroke workup was undertaken.  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 -MRA brain--no large vessel occlusions -Carotid Duplex--no hemodynamically significant stenosis -Echo--EF 60-65, no emboli -LDL--176--start atorvastatin 80 mg daily -HbA1C--5.7 -continue aspirin 325 mg  HTN -allow for permissive HTN -hydralazine for SBP >210 DBP >120 -UDS neg  Hyperlipidemia -LDL--176--start atorvastatin 80 mg daily  Impaired glucose tolerance -Hemoglobin A1c 5.7 -Lifestyle modification  Disposition Plan: CIR 7/11  Family Communication: Family at bedside 7/10  Consultants: Neurology  Code Status: FULL   DVT Prophylaxis: [place] Lovenox  Procedures: As Listed in Progress Note Above  Antibiotics: None    Subjective: Patient states that his right arm and right leg still feel very weak without any improvement. He denies any fevers, chills, chest pain, shortness breath, nausea, vomiting, diarrhea. No abdominal pain. Medication melena.  Objective: Filed Vitals:   10/26/15 0154 10/26/15 0640 10/26/15 0914 10/26/15 1440  BP: 157/105 161/103 162/107 173/100  Pulse:  75 86 87 79  Temp: 98 F (36.7 C) 98.2 F (36.8 C) 98.6 F (37 C) 98.6 F (37 C)  TempSrc: Axillary Oral Oral Oral  Resp: 18 20 20 20   Height:      Weight:      SpO2: 98% 98% 98% 98%    Intake/Output Summary (Last 24 hours) at 10/26/15 1716 Last data filed at 10/26/15 1504  Gross per 24 hour  Intake    240 ml  Output   1250 ml  Net  -1010 ml   Weight change:  Exam:   General:  Pt is alert, follows commands appropriately, not in acute distress  HEENT: No icterus, No thrush, No neck mass, [place]/AT  Cardiovascular: RRR, S1/S2, no rubs, no gallops  Respiratory: CTA bilaterally, no wheezing, no crackles, no rhonchi  Abdomen: Soft/+BS, non tender, non distended, no guarding  Extremities: No edema, No lymphangitis, No petechiae, No rashes, no synovitis   Data Reviewed: I have personally reviewed following labs and imaging studies Basic Metabolic Panel:  Recent Labs Lab 10/25/15 0127 10/25/15 0140  NA 138 140  K 3.8 3.8  CL 105 104  CO2 26  --   GLUCOSE 105* 103*  BUN 23* 24*  CREATININE 0.91 1.10  CALCIUM 9.2  --    Liver Function Tests:  Recent Labs Lab 10/25/15 0127  AST 25  ALT 45  ALKPHOS 66  BILITOT 0.7  PROT 8.0  ALBUMIN 4.7   No results for input(s): LIPASE, AMYLASE in the last 168 hours. No results for input(s): AMMONIA in the last 168 hours. Coagulation Profile:  Recent Labs Lab 10/25/15 0127  INR 0.93   CBC:  Recent Labs Lab 10/25/15 0127 10/25/15 0140  WBC 8.5  --   NEUTROABS 5.5  --  HGB 16.6 16.7  HCT 45.2 49.0  MCV 81.3  --   PLT 197  --    Cardiac Enzymes: No results for input(s): CKTOTAL, CKMB, CKMBINDEX, TROPONINI in the last 168 hours. BNP: Invalid input(s): POCBNP CBG:  Recent Labs Lab 10/25/15 1612 10/25/15 2115 10/26/15 0644 10/26/15 1133 10/26/15 1608  GLUCAP 108* 116* 97 118* 121*   HbA1C:  Recent Labs  10/25/15 0747  HGBA1C 5.7*   Urine analysis: No results found for: COLORURINE,  APPEARANCEUR, LABSPEC, PHURINE, GLUCOSEU, HGBUR, BILIRUBINUR, KETONESUR, PROTEINUR, UROBILINOGEN, NITRITE, LEUKOCYTESUR Sepsis Labs: @LABRCNTIP (procalcitonin:4,lacticidven:4) )No results found for this or any previous visit (from the past 240 hour(s)).   Scheduled Meds: . aspirin  300 mg Rectal Daily   Or  . aspirin  325 mg Oral Daily  . atorvastatin  80 mg Oral q1800  . enoxaparin (LOVENOX) injection  40 mg Subcutaneous Q24H   Continuous Infusions:   Procedures/Studies: Mr Brain Wo Contrast  10/25/2015  CLINICAL DATA:  Dysarthria, difficulty walking beginning today. Feeling unwell. Assess ischemic stroke. EXAM: MRI HEAD WITHOUT CONTRAST MRA HEAD WITHOUT CONTRAST TECHNIQUE: Multiplanar, multiecho pulse sequences of the brain and surrounding structures were obtained without intravenous contrast. Angiographic images of the head were obtained using MRA technique without contrast. COMPARISON:  None. CT HEAD October 25, 2015 at 0136 hours FINDINGS: MRI HEAD FINDINGS INTRACRANIAL CONTENTS: 11 x 32 mm (transverse by AP) reduced diffusion LEFT posterior caudate body and tail and, posterior LEFT putaminal with corresponding low ADC values. No susceptibility artifact to suggest hemorrhage. The ventricles and sulci are normal for patient's age. 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 No suspicious parenchymal signal, masses or mass effect. No abnormal extra-axial fluid collections. No extra-axial masses though, contrast enhanced sequences would be more sensitive. Normal major intracranial vascular flow voids present at skull base. ORBITS: The included ocular globes and orbital contents are non-suspicious. SINUSES: RIGHT maxillary mucosal retention cyst. Minimal paranasal sinus mucosal thickening. Mastoid air cells are well aerated. SKULL/SOFT TISSUES: No abnormal sellar expansion. No suspicious calvarial bone marrow signal. Craniocervical junction maintained. MRA HEAD FINDINGS ANTERIOR CIRCULATION: Normal flow related  enhancement of the included cervical, petrous, cavernous and supraclinoid internal carotid arteries. Patent anterior communicating artery. Bilateral anterior cerebral arteries arise from RIGHT A1-2 junction with tiny LEFT A1 segment. No large vessel occlusion, high-grade stenosis, aneurysm. Mild luminal irregularity anterior and middle cerebral arteries. POSTERIOR CIRCULATION: Codominant vertebral artery's. Basilar artery is patent, with normal flow related enhancement of the main branch vessels. Fenestrated proximal basilar artery. Fetal origin RIGHT posterior cerebral artery. Robust LEFT posterior communicating artery. Normal flow related enhancement of the posterior cerebral arteries. No large vessel occlusion, high-grade stenosis, aneurysm. Mild luminal irregularity of the posterior cerebral arteries. IMPRESSION: MRI HEAD: Acute LEFT basal ganglia nonhemorrhagic infarct. 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 MRA HEAD: No emergent large vessel occlusion or severe stenosis. Mild luminal irregularity of the cerebral arteries compatible with atherosclerosis. Electronically Signed   By: Awilda Metro M.D.   On: 10/25/2015 06:03   Mr Maxine Glenn Head/brain Wo Cm  10/25/2015  CLINICAL DATA:  Dysarthria, difficulty walking beginning today. Feeling unwell. Assess ischemic stroke. EXAM: MRI HEAD WITHOUT CONTRAST MRA HEAD WITHOUT CONTRAST TECHNIQUE: Multiplanar, multiecho pulse sequences of the brain and surrounding structures were obtained without intravenous contrast. Angiographic images of the head were obtained using MRA technique without contrast. COMPARISON:  None. CT HEAD October 25, 2015 at 0136 hours FINDINGS: MRI HEAD FINDINGS INTRACRANIAL CONTENTS: 11 x 32 mm (transverse by AP) reduced diffusion LEFT  posterior caudate body and tail and, posterior LEFT putaminal with corresponding low ADC values. No susceptibility artifact to suggest hemorrhage. The ventricles and sulci are normal for patient's age. Old LEFT thalamus  lacunar infarct. No suspicious parenchymal signal, masses or mass effect. No abnormal extra-axial fluid collections. No extra-axial masses though, contrast enhanced sequences would be more sensitive. Normal major intracranial vascular flow voids present at skull base. ORBITS: The included ocular globes and orbital contents are non-suspicious. SINUSES: RIGHT maxillary mucosal retention cyst. Minimal paranasal sinus mucosal thickening. Mastoid air cells are well aerated. SKULL/SOFT TISSUES: No abnormal sellar expansion. No suspicious calvarial bone marrow signal. Craniocervical junction maintained. MRA HEAD FINDINGS ANTERIOR CIRCULATION: Normal flow related enhancement of the included cervical, petrous, cavernous and supraclinoid internal carotid arteries. Patent anterior communicating artery. Bilateral anterior cerebral arteries arise from RIGHT A1-2 junction with tiny LEFT A1 segment. No large vessel occlusion, high-grade stenosis, aneurysm. Mild luminal irregularity anterior and middle cerebral arteries. POSTERIOR CIRCULATION: Codominant vertebral artery's. Basilar artery is patent, with normal flow related enhancement of the main branch vessels. Fenestrated proximal basilar artery. Fetal origin RIGHT posterior cerebral artery. Robust LEFT posterior communicating artery. Normal flow related enhancement of the posterior cerebral arteries. No large vessel occlusion, high-grade stenosis, aneurysm. Mild luminal irregularity of the posterior cerebral arteries. IMPRESSION: MRI HEAD: Acute LEFT basal ganglia nonhemorrhagic infarct. 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 MRA HEAD: No emergent large vessel occlusion or severe stenosis. Mild luminal irregularity of the cerebral arteries compatible with atherosclerosis. Electronically Signed   By: Awilda Metro M.D.   On: 10/25/2015 06:03   Ct Head Code Stroke W/o Cm  10/25/2015  CLINICAL DATA:  Acute onset of slurred speech. Code stroke. Initial encounter. EXAM: CT HEAD  WITHOUT CONTRAST TECHNIQUE: Contiguous axial images were obtained from the base of the skull through the vertex without intravenous contrast. COMPARISON:  None. FINDINGS: There is no evidence of acute infarction, mass lesion, or intra- or extra-axial hemorrhage on CT. A small chronic infarct is noted at the left thalamus. The posterior fossa, including the cerebellum, brainstem and fourth ventricle, is within normal limits. The third and lateral ventricles are unremarkable in appearance. The cerebral hemispheres are symmetric in appearance, with normal gray-white differentiation. No mass effect or midline shift is seen. There is no evidence of fracture; visualized osseous structures are unremarkable in appearance. The orbits are within normal limits. A small mucus retention cyst or polyp is noted at the right maxillary sinus. The remaining paranasal sinuses and mastoid air cells are well-aerated. No significant soft tissue abnormalities are seen. IMPRESSION: 1. No acute intracranial pathology seen on CT. 2. 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 3. Small mucus retention cyst or polyp at the right maxillary sinus. These results were called by telephone at the time of interpretation on 10/25/2015 at 1:40 am to Dr. Azalia Bilis, who verbally acknowledged these results. Electronically Signed   By: Roanna Raider M.D.   On: 10/25/2015 01:40    Javontae Marlette, DO  [facility] Pager [phone]  If 7PM-7AM, please contact night-coverage [email or web address] Password [email or web address] 10/26/2015, 5:16 PM   LOS: 1 day

## 2015-10-26 NOTE — Evaluation (Signed)
Occupational Therapy Evaluation Patient Details Name: Brad Tran MRN: [id number] DOB: Jan 11, 1966 Today's Date: 10/26/2015    History of Present Illness 50 yo male admitted with Acute L basal ganglia infarct and remote L thalamus lacunar infarct PMH: HTN   Clinical Impression   PT admitted with L basal ganglia infarct. Pt currently with functional limitiations due to the deficits listed below (see OT problem list). PTA was independent with all adls.  Pt will benefit from skilled OT to increase their independence and safety with adls and balance to allow discharge CIR. Pt with R side affect and this is patients dominant side so all adls are affect and impaired balance.      Follow Up Recommendations  CIR    Equipment Recommendations  Other (comment)    Recommendations for Other Services Rehab consult     Precautions / Restrictions Precautions Precautions: Fall Precaution Comments: bed/chair alarm Restrictions Weight Bearing Restrictions: No      Mobility Bed Mobility Overal bed mobility: Needs Assistance Bed Mobility: Rolling;Supine to Sit;Sit to Supine Rolling: Mod assist   Supine to sit: Mod assist Sit to supine: Min assist   General bed mobility comments: needed (A) to position R UE and to elevate trunk off bed surface. pt with good sequence but needed physical (A)  Transfers Overall transfer level: Needs assistance   Transfers: Sit to/from Stand Sit to Stand: Mod assist Stand pivot transfers: Mod assist       General transfer comment: needs (A) to shift weight    Balance Overall balance assessment: Needs assistance Sitting-balance support: Single extremity supported;Feet supported Sitting balance-Leahy Scale: Fair Sitting balance - Comments: R UE extended with lack of awareness to positioning R UE   Standing balance support: Single extremity supported;During functional activity Standing balance-Leahy Scale: Poor                               ADL Overall ADL's : Needs assistance/impaired     Grooming: Wash/dry face;Moderate assistance Grooming Details (indicate cue type and reason): pt is R handed     Lower Body Bathing: Moderate assistance;Sit to/from stand   Upper Body Dressing : Moderate assistance;Sitting Upper Body Dressing Details (indicate cue type and reason): educated on threading R UE first     Toilet Transfer: Moderate assistance             General ADL Comments: Pt supine on arrival with pending echo. Pt up to chair earlier. pt completed bed transfer and transfer at EOB for safety. pt with L side weakness so kept bedside . Pt able to side step to [facility] with (A) to shift weight. pt able to shift R LE forward and backward with therapist helping facilitate L LE weight bearing     Vision     Perception     Praxis      Pertinent Vitals/Pain Pain Assessment: No/denies pain     Hand Dominance Right   Extremity/Trunk Assessment Upper Extremity Assessment Upper Extremity Assessment: RUE deficits/detail RUE Deficits / Details: R shoulder shrug present, shoulder flexion abscent, shoulder extension 1 out 5, elbow flexion abscent, elbow extension 1 out 5, no supination/ no pronation, no grasp RUE Coordination: decreased fine motor;decreased gross motor (reports normal sensation)   Lower Extremity Assessment Lower Extremity Assessment: Defer to PT evaluation   Cervical / Trunk Assessment Cervical / Trunk Assessment: Normal   Communication Communication Communication: No difficulties   Cognition Arousal/Alertness: Awake/alert Behavior  During Therapy: WFL for tasks assessed/performed Overall Cognitive Status: Within Functional Limits for tasks assessed                     General Comments       Exercises       Shoulder Instructions      Home Living Family/patient expects to be discharged to:: Private residence Living Arrangements: Other relatives (roommate) Available Help at  Discharge: Family;Friend(s);Available PRN/intermittently Type of Home: House Home Access: Level entry     Home Layout: One level     Bathroom Shower/Tub: Tub/shower unit         Home Equipment: None   Additional Comments: son daughter and brother in from [place]. brother with wife and child visiting from one hour away and has a third brother that lives 30 minutes away. Pt lives with roommate      Prior Functioning/Environment Level of Independence: Independent             OT Diagnosis: Generalized weakness;Hemiplegia dominant side   OT Problem List: Decreased strength;Decreased range of motion;Decreased activity tolerance;Impaired balance (sitting and/or standing);Decreased safety awareness;Decreased knowledge of use of DME or AE;Decreased knowledge of precautions;Impaired UE functional use   OT Treatment/Interventions: Self-care/ADL training;Therapeutic exercise;Neuromuscular education;DME and/or AE instruction;Therapeutic activities;Balance training    OT Goals(Current goals can be found in the care plan section) Acute Rehab OT Goals Patient Stated Goal: return to prior level of function OT Goal Formulation: With patient Time For Goal Achievement: 11/09/15 Potential to Achieve Goals: Good  OT Frequency: Min 2X/week   Barriers to D/C: Other (comment) (family visiting from out of town )  uncertain of roommates ability to (A)       Co-evaluation              End of Session Equipment Utilized During Treatment: Stage managerGait belt Nurse Communication: Mobility status;Precautions  Activity Tolerance: Patient tolerated treatment well Patient left: in bed;with call bell/phone within reach;with family/visitor present;Other (comment) (transfer staff present)   Time: [id number] OT Time Calculation (min): 16 min Charges:  OT General Charges $OT Visit: 1 Procedure OT Evaluation $OT Eval Moderate Complexity: 1 Procedure G-Codes:    Boone MasterJones, Brittan Mapel B 10/26/2015, 2:55  PM   Mateo FlowJones, Brynn   OTR/L Pager: [phone] Office: [phone] .

## 2015-10-26 NOTE — Discharge Summary (Signed)
Physician Discharge Summary  Brad Tran ZOX:[id number] DOB: Feb 19, 1966 DOA: 10/25/2015  PCP: No primary care provider on file.  Admit date: 10/25/2015 Discharge date: 10/27/15  Admitted From:  Home Disposition:  CIR  Recommendations for Outpatient Follow-up:  1. Follow up with PCP in 1-2 weeks 2. Please obtain BMP/CBC in one week    Discharge Condition: Stable CODE STATUS: Full Diet recommendation: Heart Healthy  Brief/Interim Summary: 50 year old male with a history of hypertension presented with acute onset of gait instability, slurred speech and right-sided weakness, upper greater than lower extremity. The patient woke up around 11 PM on 10/24/2015 when he developed the above symptoms. The patient normally works third shift and gets up around that period of time. The patient went to work or his coworkers noted worsening symptoms. As a result, the patient was brought to emergency department for further evaluation. Neurology was consulted, and full stroke workup was undertaken.  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 -MRA brain--no large vessel occlusions -Carotid Duplex--no hemodynamically significant stenosis -Echo--EF 60-65, no emboli -LDL--176--start atorvastatin 80 mg daily -HbA1C--5.7 -continue aspirin 325 mg daily  HTN -allow for permissive HTN -hydralazine for SBP >210 DBP >120 -UDS neg -start metoprolol tartrate 25 mg bid 10/28/15  Hyperlipidemia -LDL--176--start atorvastatin 80 mg daily  Impaired glucose tolerance -Hemoglobin A1c 5.7 -Lifestyle modification   Discharge Instructions      Discharge Instructions    Ambulatory referral to Neurology    Complete by:  As directed   Pt will follow up with Dr. Roda Shutters at [facility] in about 2 months. Thanks.     Diet - low sodium heart healthy     Complete by:  As directed      Increase activity slowly    Complete by:  As directed             Medication List    TAKE these medications        aspirin 325 MG tablet  Take 1 tablet (325 mg total) by mouth daily.     atorvastatin 80 MG tablet  Commonly known as:  LIPITOR  Take 1 tablet (80 mg total) by mouth daily at 6 PM.     metoprolol tartrate 25 MG tablet  Commonly known as:  LOPRESSOR  Take 1 tablet (25 mg total) by mouth 2 (two) times daily.  Start taking on:  10/28/2015       Follow-up Information    Follow up with Xu,Jindong, MD. Schedule an appointment as soon as possible for a visit in 2 months.   Specialty:  Neurology   Why:  stroke clinic   Contact information:   [address] [place] [address] [phone]      No Known Allergies  Consultations:  Neurology  PM&R   Procedures/Studies: Mr Brain Wo Contrast  10/25/2015  CLINICAL DATA:  Dysarthria, difficulty walking beginning today. Feeling unwell. Assess ischemic stroke. EXAM: MRI HEAD WITHOUT CONTRAST MRA HEAD WITHOUT CONTRAST TECHNIQUE: Multiplanar, multiecho pulse sequences of the brain and surrounding structures were obtained without intravenous contrast. Angiographic images of the head were obtained using MRA technique without contrast. COMPARISON:  None. CT HEAD October 25, 2015 at 0136 hours FINDINGS: MRI HEAD FINDINGS INTRACRANIAL CONTENTS: 11 x 32 mm (transverse by AP) reduced diffusion LEFT posterior caudate body and tail and, posterior LEFT putaminal with corresponding low ADC values. No susceptibility artifact to  suggest hemorrhage. The ventricles and sulci are normal for patient's age. 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 No suspicious parenchymal signal, masses or mass effect. No abnormal extra-axial fluid collections. No extra-axial masses though, contrast enhanced sequences would be more sensitive. Normal major intracranial vascular flow voids present at skull base. ORBITS: The  included ocular globes and orbital contents are non-suspicious. SINUSES: RIGHT maxillary mucosal retention cyst. Minimal paranasal sinus mucosal thickening. Mastoid air cells are well aerated. SKULL/SOFT TISSUES: No abnormal sellar expansion. No suspicious calvarial bone marrow signal. Craniocervical junction maintained. MRA HEAD FINDINGS ANTERIOR CIRCULATION: Normal flow related enhancement of the included cervical, petrous, cavernous and supraclinoid internal carotid arteries. Patent anterior communicating artery. Bilateral anterior cerebral arteries arise from RIGHT A1-2 junction with tiny LEFT A1 segment. No large vessel occlusion, high-grade stenosis, aneurysm. Mild luminal irregularity anterior and middle cerebral arteries. POSTERIOR CIRCULATION: Codominant vertebral artery's. Basilar artery is patent, with normal flow related enhancement of the main branch vessels. Fenestrated proximal basilar artery. Fetal origin RIGHT posterior cerebral artery. Robust LEFT posterior communicating artery. Normal flow related enhancement of the posterior cerebral arteries. No large vessel occlusion, high-grade stenosis, aneurysm. Mild luminal irregularity of the posterior cerebral arteries. IMPRESSION: MRI HEAD: Acute LEFT basal ganglia nonhemorrhagic infarct. 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 MRA HEAD: No emergent large vessel occlusion or severe stenosis. Mild luminal irregularity of the cerebral arteries compatible with atherosclerosis. Electronically Signed   By: Awilda Metroourtnay  Bloomer M.D.   On: 10/25/2015 06:03   Mr Maxine GlennMra Head/brain Wo Cm  10/25/2015  CLINICAL DATA:  Dysarthria, difficulty walking beginning today. Feeling unwell. Assess ischemic stroke. EXAM: MRI HEAD WITHOUT CONTRAST MRA HEAD WITHOUT CONTRAST TECHNIQUE: Multiplanar, multiecho pulse sequences of the brain and surrounding structures were obtained without intravenous contrast. Angiographic images of the head were obtained using MRA technique without contrast.  COMPARISON:  None. CT HEAD October 25, 2015 at 0136 hours FINDINGS: MRI HEAD FINDINGS INTRACRANIAL CONTENTS: 11 x 32 mm (transverse by AP) reduced diffusion LEFT posterior caudate body and tail and, posterior LEFT putaminal with corresponding low ADC values. No susceptibility artifact to suggest hemorrhage. The ventricles and sulci are normal for patient's age. 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 No suspicious parenchymal signal, masses or mass effect. No abnormal extra-axial fluid collections. No extra-axial masses though, contrast enhanced sequences would be more sensitive. Normal major intracranial vascular flow voids present at skull base. ORBITS: The included ocular globes and orbital contents are non-suspicious. SINUSES: RIGHT maxillary mucosal retention cyst. Minimal paranasal sinus mucosal thickening. Mastoid air cells are well aerated. SKULL/SOFT TISSUES: No abnormal sellar expansion. No suspicious calvarial bone marrow signal. Craniocervical junction maintained. MRA HEAD FINDINGS ANTERIOR CIRCULATION: Normal flow related enhancement of the included cervical, petrous, cavernous and supraclinoid internal carotid arteries. Patent anterior communicating artery. Bilateral anterior cerebral arteries arise from RIGHT A1-2 junction with tiny LEFT A1 segment. No large vessel occlusion, high-grade stenosis, aneurysm. Mild luminal irregularity anterior and middle cerebral arteries. POSTERIOR CIRCULATION: Codominant vertebral artery's. Basilar artery is patent, with normal flow related enhancement of the main branch vessels. Fenestrated proximal basilar artery. Fetal origin RIGHT posterior cerebral artery. Robust LEFT posterior communicating artery. Normal flow related enhancement of the posterior cerebral arteries. No large vessel occlusion, high-grade stenosis, aneurysm. Mild luminal irregularity of the posterior cerebral arteries. IMPRESSION: MRI HEAD: Acute LEFT basal ganglia nonhemorrhagic infarct. Old LEFT thalamus  lacunar infarct. MRA HEAD: No emergent large vessel occlusion or severe stenosis. Mild luminal irregularity of the cerebral arteries compatible with atherosclerosis. Electronically Signed  By: Awilda Metro M.D.   On: 10/25/2015 06:03   Ct Head Code Stroke W/o Cm  10/25/2015  CLINICAL DATA:  Acute onset of slurred speech. Code stroke. Initial encounter. EXAM: CT HEAD WITHOUT CONTRAST TECHNIQUE: Contiguous axial images were obtained from the base of the skull through the vertex without intravenous contrast. COMPARISON:  None. FINDINGS: There is no evidence of acute infarction, mass lesion, or intra- or extra-axial hemorrhage on CT. A small chronic infarct is noted at the left thalamus. The posterior fossa, including the cerebellum, brainstem and fourth ventricle, is within normal limits. The third and lateral ventricles are unremarkable in appearance. The cerebral hemispheres are symmetric in appearance, with normal gray-white differentiation. No mass effect or midline shift is seen. There is no evidence of fracture; visualized osseous structures are unremarkable in appearance. The orbits are within normal limits. A small mucus retention cyst or polyp is noted at the right maxillary sinus. The remaining paranasal sinuses and mastoid air cells are well-aerated. No significant soft tissue abnormalities are seen. IMPRESSION: 1. No acute intracranial pathology seen on CT. 2. 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 3. Small mucus retention cyst or polyp at the right maxillary sinus. These results were called by telephone at the time of interpretation on 10/25/2015 at 1:40 am to Dr. Azalia Bilis, who verbally acknowledged these results. Electronically Signed   By: Roanna Raider M.D.   On: 10/25/2015 01:40        Discharge Exam: Filed Vitals:   10/27/15 0559 10/27/15 0936  BP: 167/96 166/97  Pulse: 88 92  Temp: 99 F (37.2 C) 98.5 F (36.9 C)  Resp: 20 20   Filed Vitals:   10/26/15 2147 10/27/15  0121 10/27/15 0559 10/27/15 0936  BP: 170/89 158/116 167/96 166/97  Pulse: 68 93 88 92  Temp: 98.6 F (37 C) 98.1 F (36.7 C) 99 F (37.2 C) 98.5 F (36.9 C)  TempSrc: Oral Oral Oral Oral  Resp: 20 20 20 20   Height:      Weight:      SpO2: 96% 96% 97% 97%    General: Pt is alert, awake, not in acute distress Cardiovascular: RRR, S1/S2 +, no rubs, no gallops Respiratory: CTA bilaterally, no wheezing, no rhonchi Abdominal: Soft, NT, ND, bowel sounds + Extremities: no edema, no cyanosis   The results of significant diagnostics from this hospitalization (including imaging, microbiology, ancillary and laboratory) are listed below for reference.    Significant Diagnostic Studies: Mr Brain Wo Contrast  10/25/2015  CLINICAL DATA:  Dysarthria, difficulty walking beginning today. Feeling unwell. Assess ischemic stroke. EXAM: MRI HEAD WITHOUT CONTRAST MRA HEAD WITHOUT CONTRAST TECHNIQUE: Multiplanar, multiecho pulse sequences of the brain and surrounding structures were obtained without intravenous contrast. Angiographic images of the head were obtained using MRA technique without contrast. COMPARISON:  None. CT HEAD October 25, 2015 at 0136 hours FINDINGS: MRI HEAD FINDINGS INTRACRANIAL CONTENTS: 11 x 32 mm (transverse by AP) reduced diffusion LEFT posterior caudate body and tail and, posterior LEFT putaminal with corresponding low ADC values. No susceptibility artifact to suggest hemorrhage. The ventricles and sulci are normal for patient's age. 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 No suspicious parenchymal signal, masses or mass effect. No abnormal extra-axial fluid collections. No extra-axial masses though, contrast enhanced sequences would be more sensitive. Normal major intracranial vascular flow voids present at skull base. ORBITS: The included ocular globes and orbital contents are non-suspicious. SINUSES: RIGHT maxillary mucosal retention cyst. Minimal paranasal sinus mucosal thickening. Mastoid  air  cells are well aerated. SKULL/SOFT TISSUES: No abnormal sellar expansion. No suspicious calvarial bone marrow signal. Craniocervical junction maintained. MRA HEAD FINDINGS ANTERIOR CIRCULATION: Normal flow related enhancement of the included cervical, petrous, cavernous and supraclinoid internal carotid arteries. Patent anterior communicating artery. Bilateral anterior cerebral arteries arise from RIGHT A1-2 junction with tiny LEFT A1 segment. No large vessel occlusion, high-grade stenosis, aneurysm. Mild luminal irregularity anterior and middle cerebral arteries. POSTERIOR CIRCULATION: Codominant vertebral artery's. Basilar artery is patent, with normal flow related enhancement of the main branch vessels. Fenestrated proximal basilar artery. Fetal origin RIGHT posterior cerebral artery. Robust LEFT posterior communicating artery. Normal flow related enhancement of the posterior cerebral arteries. No large vessel occlusion, high-grade stenosis, aneurysm. Mild luminal irregularity of the posterior cerebral arteries. IMPRESSION: MRI HEAD: Acute LEFT basal ganglia nonhemorrhagic infarct. 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 MRA HEAD: No emergent large vessel occlusion or severe stenosis. Mild luminal irregularity of the cerebral arteries compatible with atherosclerosis. Electronically Signed   By: Awilda Metro M.D.   On: 10/25/2015 06:03   Mr Maxine Glenn Head/brain Wo Cm  10/25/2015  CLINICAL DATA:  Dysarthria, difficulty walking beginning today. Feeling unwell. Assess ischemic stroke. EXAM: MRI HEAD WITHOUT CONTRAST MRA HEAD WITHOUT CONTRAST TECHNIQUE: Multiplanar, multiecho pulse sequences of the brain and surrounding structures were obtained without intravenous contrast. Angiographic images of the head were obtained using MRA technique without contrast. COMPARISON:  None. CT HEAD October 25, 2015 at 0136 hours FINDINGS: MRI HEAD FINDINGS INTRACRANIAL CONTENTS: 11 x 32 mm (transverse by AP) reduced diffusion LEFT posterior  caudate body and tail and, posterior LEFT putaminal with corresponding low ADC values. No susceptibility artifact to suggest hemorrhage. The ventricles and sulci are normal for patient's age. 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 No suspicious parenchymal signal, masses or mass effect. No abnormal extra-axial fluid collections. No extra-axial masses though, contrast enhanced sequences would be more sensitive. Normal major intracranial vascular flow voids present at skull base. ORBITS: The included ocular globes and orbital contents are non-suspicious. SINUSES: RIGHT maxillary mucosal retention cyst. Minimal paranasal sinus mucosal thickening. Mastoid air cells are well aerated. SKULL/SOFT TISSUES: No abnormal sellar expansion. No suspicious calvarial bone marrow signal. Craniocervical junction maintained. MRA HEAD FINDINGS ANTERIOR CIRCULATION: Normal flow related enhancement of the included cervical, petrous, cavernous and supraclinoid internal carotid arteries. Patent anterior communicating artery. Bilateral anterior cerebral arteries arise from RIGHT A1-2 junction with tiny LEFT A1 segment. No large vessel occlusion, high-grade stenosis, aneurysm. Mild luminal irregularity anterior and middle cerebral arteries. POSTERIOR CIRCULATION: Codominant vertebral artery's. Basilar artery is patent, with normal flow related enhancement of the main branch vessels. Fenestrated proximal basilar artery. Fetal origin RIGHT posterior cerebral artery. Robust LEFT posterior communicating artery. Normal flow related enhancement of the posterior cerebral arteries. No large vessel occlusion, high-grade stenosis, aneurysm. Mild luminal irregularity of the posterior cerebral arteries. IMPRESSION: MRI HEAD: Acute LEFT basal ganglia nonhemorrhagic infarct. 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 MRA HEAD: No emergent large vessel occlusion or severe stenosis. Mild luminal irregularity of the cerebral arteries compatible with atherosclerosis.  Electronically Signed   By: Awilda Metro M.D.   On: 10/25/2015 06:03   Ct Head Code Stroke W/o Cm  10/25/2015  CLINICAL DATA:  Acute onset of slurred speech. Code stroke. Initial encounter. EXAM: CT HEAD WITHOUT CONTRAST TECHNIQUE: Contiguous axial images were obtained from the base of the skull through the vertex without intravenous contrast. COMPARISON:  None. FINDINGS: There is no evidence of acute infarction, mass lesion, or intra- or  extra-axial hemorrhage on CT. A small chronic infarct is noted at the left thalamus. The posterior fossa, including the cerebellum, brainstem and fourth ventricle, is within normal limits. The third and lateral ventricles are unremarkable in appearance. The cerebral hemispheres are symmetric in appearance, with normal gray-white differentiation. No mass effect or midline shift is seen. There is no evidence of fracture; visualized osseous structures are unremarkable in appearance. The orbits are within normal limits. A small mucus retention cyst or polyp is noted at the right maxillary sinus. The remaining paranasal sinuses and mastoid air cells are well-aerated. No significant soft tissue abnormalities are seen. IMPRESSION: 1. No acute intracranial pathology seen on CT. 2. 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 3. Small mucus retention cyst or polyp at the right maxillary sinus. These results were called by telephone at the time of interpretation on 10/25/2015 at 1:40 am to Dr. Azalia Bilis, who verbally acknowledged these results. Electronically Signed   By: Roanna Raider M.D.   On: 10/25/2015 01:40     Microbiology: No results found for this or any previous visit (from the past 240 hour(s)).   Labs: Basic Metabolic Panel:  Recent Labs Lab 10/25/15 0127 10/25/15 0140  NA 138 140  K 3.8 3.8  CL 105 104  CO2 26  --   GLUCOSE 105* 103*  BUN 23* 24*  CREATININE 0.91 1.10  CALCIUM 9.2  --    Liver Function Tests:  Recent Labs Lab 10/25/15 0127    AST 25  ALT 45  ALKPHOS 66  BILITOT 0.7  PROT 8.0  ALBUMIN 4.7   No results for input(s): LIPASE, AMYLASE in the last 168 hours. No results for input(s): AMMONIA in the last 168 hours. CBC:  Recent Labs Lab 10/25/15 0127 10/25/15 0140  WBC 8.5  --   NEUTROABS 5.5  --   HGB 16.6 16.7  HCT 45.2 49.0  MCV 81.3  --   PLT 197  --    Cardiac Enzymes: No results for input(s): CKTOTAL, CKMB, CKMBINDEX, TROPONINI in the last 168 hours. BNP: Invalid input(s): POCBNP CBG:  Recent Labs Lab 10/26/15 1133 10/26/15 1608 10/26/15 2146 10/27/15 0624 10/27/15 1124  GLUCAP 118* 121* 129* 102* 84    Time coordinating discharge:  Greater than 30 minutes  Signed:  Loran Auguste, DO [facility] Pager: [phone] 10/27/2015, 1:42 PM

## 2015-10-26 NOTE — Progress Notes (Signed)
  Echocardiogram 2D Echocardiogram has been performed.  Cathie BeamsGREGORY, Alyia Lacerte 10/26/2015, 2:33 PM

## 2015-10-26 NOTE — Progress Notes (Signed)
Rehab admissions - I met with patient, his son, and a brother.  Family would like to discuss inpatient rehab options together tonight.  Patient may be able to admit to inpatient rehab tomorrow if family in agreement.  Daughter plans to stay with patient after discharge home.  Call me for questions.  #[phone] 

## 2015-10-26 NOTE — Consult Note (Signed)
Physical Medicine and Rehabilitation Consult Reason for Consult: Left basal ganglia corona radiata infarct Referring Physician: [facility]   HPI: Brad Tran is a 50 y.o. right handed limited English-speaking male with unremarkable past medical history on no scheduled medications at time of admission. Per chart review patient lives with family. Independent prior to admission. Family works during the day. One level home. Presented 10/25/2015 with right-sided weakness and slurred speech. Urine drug screen negative. Cranial CT scan with no acute changes. 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 MRI of the brain showed acute left basal ganglia nonhemorrhagic infarct. MRA with no emergent large vessel occlusion or stenosis. Carotid Dopplers had no ICA stenosis. Echocardiogram is pending. Patient did not receive TPA. Neurology follow-up currently on aspirin for CVA prophylaxis. Subcutaneous Lovenox for DVT prophylaxis. Tolerating a regular diet. Physical therapy evaluation completed with recommendations of physical medicine rehabilitation consult.   Review of Systems  Constitutional:       Right sided weakness  HENT: Negative for hearing loss.   Eyes: Negative for blurred vision and double vision.  Respiratory: Negative for cough and shortness of breath.   Cardiovascular: Negative for chest pain, palpitations and leg swelling.  Gastrointestinal: Positive for constipation. Negative for nausea and vomiting.  Genitourinary: Negative for dysuria and hematuria.  Musculoskeletal: Positive for myalgias.  Skin: Negative for rash.  Neurological: Positive for speech change and focal weakness. Negative for seizures and headaches.  All other systems reviewed and are negative.  History reviewed. No pertinent past medical history. History reviewed. No pertinent past surgical history. Family History  Problem Relation Age of Onset  . Stroke Father   . Heart attack Father    Social History:  reports  that he has never smoked. He has never used smokeless tobacco. He reports that he drinks alcohol. He reports that he does not use illicit drugs. Allergies: No Known Allergies No prescriptions prior to admission    Home: Home Living Family/patient expects to be discharged to:: Private residence Living Arrangements: Other relatives Available Help at Discharge: Family, Friend(s), Available PRN/intermittently Type of Home: House Home Access: Level entry Home Layout: One level Home Equipment: None  Functional History: Prior Function Level of Independence: Independent Comments: per pt, was driving Functional Status:  Mobility: Bed Mobility Overal bed mobility: Needs Assistance Bed Mobility: Supine to Sit, Sit to Supine Supine to sit: Min assist, HOB elevated Sit to supine: Mod assist General bed mobility comments: to Left EOB, pt struggles to move right hemibody to bed's edge, needs cues to assist especially arm and to square self at edge; instructed how to assist leg onto bed surface with intact foot behind ankle, needs assist to complete and min assist to scoot up in flat bed Transfers Overall transfer level: Needs assistance Equipment used: None Transfers: Sit to/from Stand, Stand Pivot Transfers Sit to Stand: Mod assist Stand pivot transfers: Mod assist General transfer comment: needs assist to sequence sit>stand and cues to align with surface prior to sitting; able to bear weight on impaired leg, holds arm in flexion posture during transfer 90 degree to chair and back to bed; delayed stepping pattern with min assist to shift weight; LOB x1 returning to bed, assisted to bed surface without issue Ambulation/Gait General Gait Details: pivotal steps to/from chair only; pt fatigued    ADL:    Cognition: Cognition Overall Cognitive Status: Difficult to assess Orientation Level: Oriented to person, Oriented to place, Oriented to time, Disoriented to  situation Cognition Arousal/Alertness:  Awake/alert (sleepy by end, easily aroused) Behavior During Therapy: WFL for tasks assessed/performed Overall Cognitive Status: Difficult to assess Difficult to assess due to: Level of arousal  Blood pressure 157/105, pulse 75, temperature 98 F (36.7 C), temperature source Axillary, resp. rate 18, height  (1.6 m), weight 79.742 kg (175 lb 12.8 oz), SpO2 98 %. Physical Exam  Vitals reviewed. Constitutional: He is oriented to person, place, and time. He appears well-developed and well-nourished.  HENT:  Head: Normocephalic and atraumatic.  Eyes: Conjunctivae and EOM are normal.  Neck: Normal range of motion. Neck supple. No thyromegaly present.  Cardiovascular: Normal rate and regular rhythm.   Respiratory: Effort normal and breath sounds normal. No respiratory distress.  GI: Soft. Bowel sounds are normal. He exhibits no distension.  Musculoskeletal: He exhibits no edema or tenderness.  Neurological: He is alert and oriented to person, place, and time.  Limited English-speaking male.  Follows simple commands. Sensation intact to light touch Motor: RUE/0/5, RLE: hip flexion knee extension 4-/5, ankle dorsi/plantar flexion 0/5 DTRs symmetric  Skin: Skin is warm and dry.  Psychiatric: He has a normal mood and affect. His behavior is normal.    Results for orders placed or performed during the hospital encounter of 10/25/15 (from the past 24 hour(s))  Lipid panel     Status: Abnormal   Collection Time: 10/25/15  7:47 AM  Result Value Ref Range   Cholesterol 269 (H) 0 - 200 mg/dL   Triglycerides 161 (H) <150 mg/dL   HDL 39 (L) >09 mg/dL   Total CHOL/HDL Ratio 6.9 RATIO   VLDL 54 (H) 0 - 40 mg/dL   LDL Cholesterol 604 (H) 0 - 99 mg/dL  Urine rapid drug screen (hosp performed)     Status: None   Collection Time: 10/25/15  9:31 AM  Result Value Ref Range   Opiates NONE DETECTED NONE DETECTED   Cocaine NONE DETECTED NONE DETECTED    Benzodiazepines NONE DETECTED NONE DETECTED   Amphetamines NONE DETECTED NONE DETECTED   Tetrahydrocannabinol NONE DETECTED NONE DETECTED   Barbiturates NONE DETECTED NONE DETECTED   Mr Brain Wo Contrast  10/25/2015  CLINICAL DATA:  Dysarthria, difficulty walking beginning today. Feeling unwell. Assess ischemic stroke. EXAM: MRI HEAD WITHOUT CONTRAST MRA HEAD WITHOUT CONTRAST TECHNIQUE: Multiplanar, multiecho pulse sequences of the brain and surrounding structures were obtained without intravenous contrast. Angiographic images of the head were obtained using MRA technique without contrast. COMPARISON:  None. CT HEAD October 25, 2015 at 0136 hours FINDINGS: MRI HEAD FINDINGS INTRACRANIAL CONTENTS: 11 x 32 mm (transverse by AP) reduced diffusion LEFT posterior caudate body and tail and, posterior LEFT putaminal with corresponding low ADC values. No susceptibility artifact to suggest hemorrhage. The ventricles and sulci are normal for patient's age. 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 No suspicious parenchymal signal, masses or mass effect. No abnormal extra-axial fluid collections. No extra-axial masses though, contrast enhanced sequences would be more sensitive. Normal major intracranial vascular flow voids present at skull base. ORBITS: The included ocular globes and orbital contents are non-suspicious. SINUSES: RIGHT maxillary mucosal retention cyst. Minimal paranasal sinus mucosal thickening. Mastoid air cells are well aerated. SKULL/SOFT TISSUES: No abnormal sellar expansion. No suspicious calvarial bone marrow signal. Craniocervical junction maintained. MRA HEAD FINDINGS ANTERIOR CIRCULATION: Normal flow related enhancement of the included cervical, petrous, cavernous and supraclinoid internal carotid arteries. Patent anterior communicating artery. Bilateral anterior cerebral arteries arise from RIGHT A1-2 junction with tiny LEFT A1 segment. No large vessel occlusion, high-grade stenosis,  aneurysm. Mild luminal  irregularity anterior and middle cerebral arteries. POSTERIOR CIRCULATION: Codominant vertebral artery's. Basilar artery is patent, with normal flow related enhancement of the main branch vessels. Fenestrated proximal basilar artery. Fetal origin RIGHT posterior cerebral artery. Robust LEFT posterior communicating artery. Normal flow related enhancement of the posterior cerebral arteries. No large vessel occlusion, high-grade stenosis, aneurysm. Mild luminal irregularity of the posterior cerebral arteries. IMPRESSION: MRI HEAD: Acute LEFT basal ganglia nonhemorrhagic infarct. 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 MRA HEAD: No emergent large vessel occlusion or severe stenosis. Mild luminal irregularity of the cerebral arteries compatible with atherosclerosis. Electronically Signed   By: Awilda Metro M.D.   On: 10/25/2015 06:03   Mr Maxine Glenn Head/brain Wo Cm  10/25/2015  CLINICAL DATA:  Dysarthria, difficulty walking beginning today. Feeling unwell. Assess ischemic stroke. EXAM: MRI HEAD WITHOUT CONTRAST MRA HEAD WITHOUT CONTRAST TECHNIQUE: Multiplanar, multiecho pulse sequences of the brain and surrounding structures were obtained without intravenous contrast. Angiographic images of the head were obtained using MRA technique without contrast. COMPARISON:  None. CT HEAD October 25, 2015 at 0136 hours FINDINGS: MRI HEAD FINDINGS INTRACRANIAL CONTENTS: 11 x 32 mm (transverse by AP) reduced diffusion LEFT posterior caudate body and tail and, posterior LEFT putaminal with corresponding low ADC values. No susceptibility artifact to suggest hemorrhage. The ventricles and sulci are normal for patient's age. 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 No suspicious parenchymal signal, masses or mass effect. No abnormal extra-axial fluid collections. No extra-axial masses though, contrast enhanced sequences would be more sensitive. Normal major intracranial vascular flow voids present at skull base. ORBITS: The included ocular globes and  orbital contents are non-suspicious. SINUSES: RIGHT maxillary mucosal retention cyst. Minimal paranasal sinus mucosal thickening. Mastoid air cells are well aerated. SKULL/SOFT TISSUES: No abnormal sellar expansion. No suspicious calvarial bone marrow signal. Craniocervical junction maintained. MRA HEAD FINDINGS ANTERIOR CIRCULATION: Normal flow related enhancement of the included cervical, petrous, cavernous and supraclinoid internal carotid arteries. Patent anterior communicating artery. Bilateral anterior cerebral arteries arise from RIGHT A1-2 junction with tiny LEFT A1 segment. No large vessel occlusion, high-grade stenosis, aneurysm. Mild luminal irregularity anterior and middle cerebral arteries. POSTERIOR CIRCULATION: Codominant vertebral artery's. Basilar artery is patent, with normal flow related enhancement of the main branch vessels. Fenestrated proximal basilar artery. Fetal origin RIGHT posterior cerebral artery. Robust LEFT posterior communicating artery. Normal flow related enhancement of the posterior cerebral arteries. No large vessel occlusion, high-grade stenosis, aneurysm. Mild luminal irregularity of the posterior cerebral arteries. IMPRESSION: MRI HEAD: Acute LEFT basal ganglia nonhemorrhagic infarct. 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 MRA HEAD: No emergent large vessel occlusion or severe stenosis. Mild luminal irregularity of the cerebral arteries compatible with atherosclerosis. Electronically Signed   By: Awilda Metro M.D.   On: 10/25/2015 06:03   Ct Head Code Stroke W/o Cm  10/25/2015  CLINICAL DATA:  Acute onset of slurred speech. Code stroke. Initial encounter. EXAM: CT HEAD WITHOUT CONTRAST TECHNIQUE: Contiguous axial images were obtained from the base of the skull through the vertex without intravenous contrast. COMPARISON:  None. FINDINGS: There is no evidence of acute infarction, mass lesion, or intra- or extra-axial hemorrhage on CT. A small chronic infarct is noted at the  left thalamus. The posterior fossa, including the cerebellum, brainstem and fourth ventricle, is within normal limits. The third and lateral ventricles are unremarkable in appearance. The cerebral hemispheres are symmetric in appearance, with normal gray-white differentiation. No mass effect or midline shift is seen. There is no evidence of fracture; visualized osseous  structures are unremarkable in appearance. The orbits are within normal limits. A small mucus retention cyst or polyp is noted at the right maxillary sinus. The remaining paranasal sinuses and mastoid air cells are well-aerated. No significant soft tissue abnormalities are seen. IMPRESSION: 1. No acute intracranial pathology seen on CT. 2. 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 3. Small mucus retention cyst or polyp at the right maxillary sinus. These results were called by telephone at the time of interpretation on 10/25/2015 at 1:40 am to Dr. Azalia BilisKEVIN CAMPOS, who verbally acknowledged these results. Electronically Signed   By: Roanna RaiderJeffery  Chang M.D.   On: 10/25/2015 01:40    Assessment/Plan: Diagnosis: Left basal ganglia corona radiata infarct Labs and images independently reviewed.  Records reviewed and summated above. Stroke: Continue secondary stroke prophylaxis and Risk Factor Modification listed below:   Antiplatelet therapy:   Blood Pressure Management:  Continue current medication with prn's with permisive HTN per primary team Statin Agent:   Pre-diabetes management:   Right sided hemiparesis: fit for orthosis to prevent contractures (resting hand splint for day, wrist cock up splint at night, PRAFO, etc Motor recovery: Fluoxetine  1. Does the need for close, 24 hr/day medical supervision in concert with the patient's rehab needs make it unreasonable for this patient to be served in a less intensive setting? Yes 2. Co-Morbidities requiring supervision/potential complications: Prediabetes, DM (Monitor in accordance with exercise  and adjust meds as necessary), HTN (monitor and provide prns in accordance with increased physical exertion and pain), 3. Due to safety, disease management and patient education, does the patient require 24 hr/day rehab nursing? Yes 4. Does the patient require coordinated care of a physician, rehab nurse, PT (1-2 hrs/day, 5 days/week) and OT (1-2 hrs/day, 5 days/week) to address physical and functional deficits in the context of the above medical diagnosis(es)? Yes Addressing deficits in the following areas: balance, endurance, locomotion, strength, transferring, dressing, toileting and psychosocial support 5. Can the patient actively participate in an intensive therapy program of at least 3 hrs of therapy per day at least 5 days per week? Yes 6. The potential for patient to make measurable gains while on inpatient rehab is excellent 7. Anticipated functional outcomes upon discharge from inpatient rehab are supervision and min assist  with PT, supervision and min assist with OT, n/a with SLP. 8. Estimated rehab length of stay to reach the above functional goals is: 16-19 days. 9. Does the patient have adequate social supports and living environment to accommodate these discharge functional goals? Potentially 10. Anticipated D/C setting: Home 11. Anticipated post D/C treatments: HH therapy and Home excercise program 12. Overall Rehab/Functional Prognosis: good  RECOMMENDATIONS: This patient's condition is appropriate for continued rehabilitative care in the following setting: Will need to clarify availiability and support of "friend" at discharge.  If caregiver available, would recommend CIR after completion of medical workup Patient has agreed to participate in recommended program. Potentially Note that insurance prior authorization may be required for reimbursement for recommended care.  Comment: Rehab Admissions Coordinator to follow up.  Maryla MorrowAnkit Patel, MD 10/26/2015

## 2015-10-26 NOTE — Care Management Note (Signed)
Case Management Note  Patient Details  Name: Brad Tran MRN: [id number] Date of Birth: 11/16/1965  Subjective/Objective:     Pt admitted with CVA. He is from home with relatives.               Action/Plan: PT rec is for CIR. CM following for discharge disposition.   Expected Discharge Date:                  Expected Discharge Plan:  IP Rehab Facility  In-House Referral:     Discharge planning Services     Post Acute Care Choice:    Choice offered to:     DME Arranged:    DME Agency:     HH Arranged:    HH Agency:     Status of Service:  In process, will continue to follow  If discussed at Long Length of Stay Meetings, dates discussed:    Additional Comments:  Brad BaloKelli F Milaina Sher, RN 10/26/2015, 10:30 AM

## 2015-10-26 NOTE — Progress Notes (Signed)
Physical Therapy Treatment Patient Details Name: Brad Tran MRN: [id number] DOB: [birth year] Today's Date: 10/26/2015    History of Present Illness 50 yo male admitted with Acute L basal ganglia infarct and remote L thalamus lacunar infarct PMH: HTN    PT Comments    Pt able to tolerate ambulation this date with max tactile and verbal cuing with R LE advancement/lplacement. Pt initiating with R hip flexor but minimal quad activation and no dorsiflexion. Pt very motivated and remains an excellent candidate for CIR upon d/c.  Follow Up Recommendations  CIR     Equipment Recommendations   (TBD by next venue)    Recommendations for Other Services Rehab consult     Precautions / Restrictions Precautions Precautions: Fall Precaution Comments: bed/chair alarm Restrictions Weight Bearing Restrictions: No    Mobility  Bed Mobility Overal bed mobility: Needs Assistance Bed Mobility: Sit to Supine Rolling: Mod assist   Supine to sit: Mod assist Sit to supine: Min assist   General bed mobility comments: pt able to get R LE back up in to bed with assist of L LE 1/2 but then used R hip flexor the rest of the way  Transfers Overall transfer level: Needs assistance Equipment used:  (1 person assist) Transfers: Sit to/from [employer] Sit to Stand: Mod assist Stand pivot transfers: Max assist       General transfer comment: pt impulsively quick with decreased insight to R LE weakness as noted by buckling. max directional v/c's for sequencing and max tactile cues for advancement and placement of R LE to complete std pvt to bed  Ambulation/Gait Ambulation/Gait assistance: Mod assist;+2 physical assistance;Max assist Ambulation Distance (Feet): 15 Feet (x2) Assistive device:  (1 person 3 musketeer, 2 person) Gait Pattern/deviations: Step-to pattern Gait velocity: slow Gait velocity interpretation: Below normal speed for age/gender General Gait Details: 1 person in  front to assist with weight shift to the L to aide in advancement of R LE. 1 person on R LE providing manual cueing at hip flexor and hamstring to step and at foot for placement to prevent supination. 2nd person to then also provide posterior knee support to prevent hyperextension in stance phase. 2 bouts of 15' with seated rest break in [employer]            Wheelchair Mobility    Modified Rankin (Stroke Patients Only) Modified Rankin (Stroke Patients Only) Pre-Morbid Rankin Score: No symptoms Modified Rankin: Moderately severe disability     Balance Overall balance assessment: Needs assistance Sitting-balance support: Feet supported Sitting balance-Leahy Scale: Fair Sitting balance - Comments: R UE extended with lack of awareness to positioning R UE   Standing balance support: Single extremity supported Standing balance-Leahy Scale: Poor Standing balance comment: R knee buckling and unable to recover without assist                    Cognition Arousal/Alertness: Awake/alert Behavior During Therapy: WFL for tasks assessed/performed Overall Cognitive Status: Within Functional Limits for tasks assessed                      Exercises General Exercises - Lower Extremity Quad Sets: AROM;Right;10 reps;Supine (5 second hold) Heel Slides: AAROM;AROM;Right;10 reps;Supine (transition from AA to A)    General Comments        Pertinent Vitals/Pain Pain Assessment: No/denies pain    Home Living Family/patient expects to be discharged to:: Private residence Living Arrangements: Other relatives (roommate) Available  Help at Discharge: Family;Friend(s);Available PRN/intermittently Type of Home: House Home Access: Level entry   Home Layout: One level Home Equipment: None Additional Comments: son daughter and brother in from [place]. brother with wife and child visiting from one hour away and has a third brother that lives 30 minutes away. Pt lives with  roommate    Prior Function Level of Independence: Independent          PT Goals (current goals can now be found in the care plan section) Acute Rehab PT Goals Patient Stated Goal: return to prior level of function Progress towards PT goals: Progressing toward goals    Frequency  Min 4X/week    PT Plan Current plan remains appropriate    Co-evaluation             End of Session Equipment Utilized During Treatment: Gait belt Activity Tolerance: Patient tolerated treatment well;Patient limited by fatigue Patient left: in bed;with call bell/phone within reach;with bed alarm set     Time: [id number] PT Time Calculation (min) (ACUTE ONLY): 34 min  Charges:  $Gait Training: 8-22 mins $Therapeutic Exercise: 8-22 mins                    G Codes:      Marcene BrawnChadwell, Koen Antilla Marie 10/26/2015, 4:27 PM   Lewis ShockAshly Velva Molinari, PT, DPT Pager #: [phone] Office #: [phone]

## 2015-10-27 ENCOUNTER — Inpatient Hospital Stay (HOSPITAL_COMMUNITY)
Admission: RE | Admit: 2015-10-27 | Discharge: 2015-11-12 | DRG: 057 | Disposition: A | Discharge status: Home Health | Payer: MEDICAID | Source: Intra-hospital | Attending: Physical Medicine & Rehabilitation | Admitting: Physical Medicine & Rehabilitation

## 2015-10-27 DIAGNOSIS — I639 Cerebral infarction, unspecified: Secondary | ICD-10-CM | POA: Diagnosis present

## 2015-10-27 DIAGNOSIS — Z823 Family history of stroke: Secondary | ICD-10-CM

## 2015-10-27 DIAGNOSIS — Z8249 Family history of ischemic heart disease and other diseases of the circulatory system: Secondary | ICD-10-CM

## 2015-10-27 DIAGNOSIS — R7303 Prediabetes: Secondary | ICD-10-CM | POA: Diagnosis present

## 2015-10-27 DIAGNOSIS — R001 Bradycardia, unspecified: Secondary | ICD-10-CM | POA: Diagnosis not present

## 2015-10-27 DIAGNOSIS — I10 Essential (primary) hypertension: Secondary | ICD-10-CM | POA: Diagnosis present

## 2015-10-27 DIAGNOSIS — G8191 Hemiplegia, unspecified affecting right dominant side: Secondary | ICD-10-CM | POA: Insufficient documentation

## 2015-10-27 DIAGNOSIS — K5900 Constipation, unspecified: Secondary | ICD-10-CM | POA: Diagnosis present

## 2015-10-27 DIAGNOSIS — M7918 Myalgia, other site: Secondary | ICD-10-CM | POA: Insufficient documentation

## 2015-10-27 DIAGNOSIS — I63312 Cerebral infarction due to thrombosis of left middle cerebral artery: Secondary | ICD-10-CM

## 2015-10-27 DIAGNOSIS — E785 Hyperlipidemia, unspecified: Secondary | ICD-10-CM | POA: Diagnosis present

## 2015-10-27 DIAGNOSIS — I69351 Hemiplegia and hemiparesis following cerebral infarction affecting right dominant side: Principal | ICD-10-CM

## 2015-10-27 DIAGNOSIS — R471 Dysarthria and anarthria: Secondary | ICD-10-CM

## 2015-10-27 LAB — CBC
HCT: 49 % (ref 39.0–52.0)
Hemoglobin: 16.5 g/dL (ref 13.0–17.0)
MCH: 28.7 pg (ref 26.0–34.0)
MCHC: 33.7 g/dL (ref 30.0–36.0)
MCV: 85.2 fL (ref 78.0–100.0)
PLATELETS: 202 10*3/uL (ref 150–400)
RBC: 5.75 MIL/uL (ref 4.22–5.81)
RDW: 13.1 % (ref 11.5–15.5)
WBC: 6.4 10*3/uL (ref 4.0–10.5)

## 2015-10-27 LAB — GLUCOSE, CAPILLARY
Glucose-Capillary: 102 mg/dL — ABNORMAL HIGH (ref 65–99)
Glucose-Capillary: 84 mg/dL (ref 65–99)

## 2015-10-27 LAB — VAS US CAROTID
LCCAPDIAS: 24 cm/s
LCCAPSYS: 102 cm/s
LEFT ECA DIAS: -16 cm/s
LEFT VERTEBRAL DIAS: 16 cm/s
LICADDIAS: -36 cm/s
LICADSYS: -85 cm/s
LICAPSYS: -49 cm/s
Left CCA dist dias: -31 cm/s
Left CCA dist sys: -98 cm/s
Left ICA prox dias: -17 cm/s
RCCADSYS: -64 cm/s
RIGHT ECA DIAS: -13 cm/s
RIGHT VERTEBRAL DIAS: 11 cm/s
Right CCA prox dias: 18 cm/s
Right CCA prox sys: 60 cm/s

## 2015-10-27 LAB — CREATININE, SERUM
CREATININE: 1.06 mg/dL (ref 0.61–1.24)
GFR calc Af Amer: >60 mL/min (ref >60)

## 2015-10-27 MED ORDER — ONDANSETRON HCL 4 MG/2ML IJ SOLN: 4.0000 mg | Freq: Four times a day (QID) | INTRAMUSCULAR | Status: DC | PRN

## 2015-10-27 MED ORDER — ENOXAPARIN SODIUM 40 MG/0.4ML ~~LOC~~ SOLN: 40.0000 mg | SUBCUTANEOUS | Status: DC

## 2015-10-27 MED ORDER — ENOXAPARIN SODIUM 40 MG/0.4ML ~~LOC~~ SOLN
40.0000 mg | SUBCUTANEOUS | Status: DC
  Administered 2015-10-28 – 2015-11-12 (×16): 40 mg via SUBCUTANEOUS
  Filled 2015-10-27 (×15): qty 0.4

## 2015-10-27 MED ORDER — ONDANSETRON HCL 4 MG PO TABS: 4.0000 mg | ORAL_TABLET | Freq: Four times a day (QID) | ORAL | Status: DC | PRN

## 2015-10-27 MED ORDER — ATORVASTATIN CALCIUM 80 MG PO TABS
80.0000 mg | ORAL_TABLET | Freq: Every day | ORAL | Status: DC
  Administered 2015-10-27 – 2015-11-11 (×16): 80 mg via ORAL
  Filled 2015-10-27 (×16): qty 1

## 2015-10-27 MED ORDER — SENNOSIDES-DOCUSATE SODIUM 8.6-50 MG PO TABS
1.0000 | ORAL_TABLET | Freq: Every evening | ORAL | Status: DC | PRN
  Administered 2015-10-29: 1 via ORAL
  Filled 2015-10-27: qty 1

## 2015-10-27 MED ORDER — ASPIRIN 325 MG PO TABS
325.0000 mg | ORAL_TABLET | Freq: Every day | ORAL | Status: DC
  Administered 2015-10-28 – 2015-11-12 (×16): 325 mg via ORAL
  Filled 2015-10-27 (×16): qty 1

## 2015-10-27 MED ORDER — ASPIRIN 300 MG RE SUPP: 300.0000 mg | Freq: Every day | RECTAL | Status: DC

## 2015-10-27 MED ORDER — SORBITOL 70 % SOLN
30.0000 mL | Freq: Every day | Status: DC | PRN
  Administered 2015-10-30: 30 mL via ORAL
  Filled 2015-10-27: qty 30

## 2015-10-27 MED ORDER — CLONIDINE HCL 0.1 MG PO TABS
0.1000 mg | ORAL_TABLET | ORAL | Status: DC | PRN
  Administered 2015-10-28 – 2015-11-08 (×2): 0.1 mg via ORAL
  Filled 2015-10-27 (×2): qty 1

## 2015-10-27 NOTE — Interval H&P Note (Signed)
Brad Tran was admitted today to Inpatient Rehabilitation with the diagnosis of left basal ganglia corona radiata infarct.  The patient's history has been reviewed, patient examined, and there is no change in status.  Patient continues to be appropriate for intensive inpatient rehabilitation.  I have reviewed the patient's chart and labs.  Questions were answered to the patient's satisfaction. The PAPE has been reviewed and assessment remains appropriate.  Stefania Goulart Karis Jubanil Wilber Fini 10/27/2015, 4:58 PM

## 2015-10-27 NOTE — PMR Pre-admission (Signed)
PMR Admission Coordinator Pre-Admission Assessment  Patient: Brad Tran is an 50 y.o., male MRN: [id number] DOB: April 22, 1965 Height: 5\' 3"  (160 cm) Weight: 79.742 kg (175 lb 12.8 oz)              Insurance Information Self pay - no insurance  Medicaid Application Date:        Case Manager:   Disability Application Date:        Case Worker:    Emergency Conservator, museum/gallery Information    Name Relation Home Work Mobile   Wimes,Peov Brother [phone]     Gavitt,April Daughter   [phone]     Current Medical History  Patient Admitting Diagnosis:  L BG/CR infarct  History of Present Illness: A 50 y.o. right handed limited English-speaking male with unremarkable past medical history on no scheduled medications at time of admission. Per chart review patient lives with family. Independent prior to admission. Family works during the day. One level home. Presented 10/25/2015 with right-sided weakness and slurred speech. Urine drug screen negative. Cranial CT scan with no acute changes. 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 MRI of the brain showed acute left basal ganglia nonhemorrhagic infarct. MRA with no emergent large vessel occlusion or stenosis. Carotid Dopplers had no ICA stenosis. Echocardiogram is pending. Patient did not receive TPA. Neurology follow-up currently on aspirin for CVA prophylaxis. Subcutaneous Lovenox for DVT prophylaxis. Tolerating a regular diet. Physical therapy evaluation completed with recommendations of physical medicine rehabilitation consult  .Patient to be admitted for comprehensive inpatient rehabilitation program.     Total: 6=NIH  Past Medical History  History reviewed. No pertinent past medical history.  Family History  family history includes Heart attack in his father; Stroke in his father.  Prior Rehab/Hospitalizations: No previous rehab admissions  Has the patient had major surgery during 100 days prior to admission? No  Current  Medications   Current facility-administered medications:  .  aspirin suppository 300 mg, 300 mg, Rectal, Daily **OR** aspirin tablet 325 mg, 325 mg, Oral, Daily, Alberteen Sam, MD, 325 mg at 10/27/15 0821 .  atorvastatin (LIPITOR) tablet 80 mg, 80 mg, Oral, q1800, Kinnie Scales Rinehuls, PA-C, 80 mg at 10/26/15 1823 .  enoxaparin (LOVENOX) injection 40 mg, 40 mg, Subcutaneous, Q24H, Alberteen Sam, MD, 40 mg at 10/27/15 6578 .  labetalol (NORMODYNE,TRANDATE) injection 10 mg, 10 mg, Intravenous, Q6H PRN, Alberteen Sam, MD .  senna-docusate (Senokot-S) tablet 1 tablet, 1 tablet, Oral, QHS PRN, Alberteen Sam, MD  Patients Current Diet: Diet Heart Room service appropriate?: Yes; Fluid consistency:: Thin Diet - low sodium heart healthy  Precautions / Restrictions Precautions Precautions: Fall Precaution Comments: bed/chair alarm Restrictions Weight Bearing Restrictions: No Other Position/Activity Restrictions: please prop R arm on pillow   Has the patient had 2 or more falls or a fall with injury in the past year?No  Prior Activity Level Community (5-7x/wk): Went out daily.  worked FT nights, was driving.  Home Assistive Devices / Equipment Home Equipment: None  Prior Device Use: Indicate devices/aids used by the patient prior to current illness, exacerbation or injury? None  Prior Functional Level Prior Function Level of Independence: Independent Comments: per pt, was driving  Self Care: Did the patient need help bathing, dressing, using the toilet or eating?  Independent  Indoor Mobility: Did the patient need assistance with walking from room to room (with or without device)? Independent  Stairs: Did the patient need assistance with internal or external stairs (with or  without device)? Independent  Functional Cognition: Did the patient need help planning regular tasks such as shopping or remembering to take medications? Independent  Current Functional  Level Cognition  Arousal/Alertness: Awake/alert Overall Cognitive Status: No family/caregiver present to determine baseline cognitive functioning Difficult to assess due to: Level of arousal Orientation Level: Oriented X4 Attention: Sustained Sustained Attention: Appears intact Memory: Impaired Memory Impairment: Retrieval deficit, Storage deficit, Decreased short term memory, Decreased recall of new information Decreased Short Term Memory: Verbal basic Awareness: Appears intact Problem Solving: Appears intact Safety/Judgment: Appears intact    Extremity Assessment (includes Sensation/Coordination)  Upper Extremity Assessment: RUE deficits/detail RUE Deficits / Details: R shoulder shrug present, shoulder flexion abscent, shoulder extension 1 out 5, elbow flexion abscent, elbow extension 1 out 5, no supination/ no pronation, no grasp RUE Coordination: decreased fine motor, decreased gross motor (reports normal sensation)  Lower Extremity Assessment: Defer to PT evaluation RLE Deficits / Details: generally 3-/5 throughout, able to flex/extend knee nearly full ROM vs gravity; able to PF/DF ankle and Great toe partial ROM; slow to initiate RLE Coordination: decreased gross motor    ADLs  Overall ADL's : Needs assistance/impaired Eating/Feeding: Set up, Sitting Eating/Feeding Details (indicate cue type and reason): pt reports "i can get it" without awareness to inability to cut food Grooming: Wash/dry hands, Wash/dry face, Oral care, Minimal assistance, Standing Grooming Details (indicate cue type and reason): Pt washing R hand showing awareness to R side without cues. pt washing face and needed cues to use wash cloth. Question if this could be personal choice vs awareness. pt needed cues to sequence oral care. pt attempting to use R hand with lack of awareness to R hand deficits during task. Pt once initiating task needed (A) due to R hand flaccid.  Lower Body Bathing: Moderate assistance,  Sit to/from stand Upper Body Dressing : Moderate assistance, Sitting Upper Body Dressing Details (indicate cue type and reason): educated on threading R UE first Lower Body Dressing: Moderate assistance, Bed level Lower Body Dressing Details (indicate cue type and reason): don socks. Able to cross R LE over L LE this session with min (A). pt needed (A) to thread toes into sock but able to use L hand to don socks Toilet Transfer: Minimal assistance Toilet Transfer Details (indicate cue type and reason): simulated EOB to chair Functional mobility during ADLs: Moderate assistance General ADL Comments: Pt don socks supine in bed with (A). pt able to complete bed mobility with min guard this session. pt transfered from EOB to sink level for grooming. pt needed mod cues to sequence transfer. pt noted to have R foot supination and adduction with transfer. pt needed cues to widen BOS.     Mobility  Overal bed mobility: Needs Assistance Bed Mobility: Supine to Sit Rolling: Mod assist Supine to sit: Min guard Sit to supine: Min assist General bed mobility comments: heavy use of L bed rail and L UE. pt allowing R UE to remain abducted to the body with attempt. Pt needed v/c at EOB to locate R ue to reposition to prevent injury. pt educated on R UE placement prior to mobility.     Transfers  Overall transfer level: Needs assistance Equipment used:  (1 person assist) Transfers: Sit to/from Stand Sit to Stand: Min assist Stand pivot transfers: Max assist General transfer comment: pt able to power up with bil LE without UE use. pt does reach for environmental supports ( chair arm rest) once in standing for balance. Pt encouraged  to avoid doing this     Ambulation / Gait / Stairs / Wheelchair Mobility  Ambulation/Gait Ambulation/Gait assistance: Mod assist, +2 physical assistance, Max assist Ambulation Distance (Feet): 15 Feet (x2) Assistive device:  (1 person 3 musketeer, 2 person) Gait  Pattern/deviations: Step-to pattern General Gait Details: 1 person in front to assist with weight shift to the L to aide in advancement of R LE. 1 person on R LE providing manual cueing at hip flexor and hamstring to step and at foot for placement to prevent supination. 2nd person to then also provide posterior knee support to prevent hyperextension in stance phase. 2 bouts of 15' with seated rest break in bewteen Gait velocity: slow Gait velocity interpretation: Below normal speed for age/gender    Posture / Balance Dynamic Sitting Balance Sitting balance - Comments: R UE extended with lack of awareness to positioning R UE Balance Overall balance assessment: Needs assistance Sitting-balance support: Single extremity supported, Feet supported Sitting balance-Leahy Scale: Fair Sitting balance - Comments: R UE extended with lack of awareness to positioning R UE Standing balance support: No upper extremity supported, During functional activity Standing balance-Leahy Scale: Fair Standing balance comment: R Ue placed on counter for support and help with balance. pt with x1 LOB at sink due to R knee weakness    Special needs/care consideration BiPAP/CPAP No CPM No Continuous Drip IV No Dialysis No         Life Vest No Oxygen No Special Bed No Trach Size No Wound Vac (area) No      Skin NO                               Bowel mgmt: Last BM 10/25/15 Bladder mgmt: Voiding in urinal Diabetic mgmt: No    Previous Home Environment Living Arrangements: Other relatives (roommate)  Lives With: Spouse Available Help at Discharge: Family, Friend(s), Available PRN/intermittently Type of Home: House Home Layout: One level Home Access: Level entry Bathroom Shower/Tub: Tub/shower unit Additional Comments: son daughter and brother in from [place]. brother with wife and child visiting from one hour away and has a third brother that lives 30 minutes away. Pt lives with roommate  Discharge Living  Setting Plans for Discharge Living Setting: House, Lives with (comment) (Lives with roommate.) Type of Home at Discharge: House Discharge Home Layout: One level Discharge Home Access: Stairs to enter Entrance Stairs-Number of Steps: 3  Social/Family/Support Systems Patient Roles: Parent, Other (Comment) (Has 2 daughters, 2 sons, and brothers.) Contact Information: Shankar Silber - brother - [phone] Anticipated Caregiver: April Seth - daughter Anticipated Caregiver's Contact Information: April - dtr - [phone] Ability/Limitations of Caregiver: Daughter plans home with patient to assist after rehab discharge.  Has a roommate who works nights. Caregiver Availability: 24/7 Discharge Plan Discussed with Primary Caregiver: Yes Is Caregiver In Agreement with Plan?: Yes Does Caregiver/Family have Issues with Lodging/Transportation while Pt is in Rehab?: No  Goals/Additional Needs Patient/Family Goal for Rehab: PT/OT supervision to min assist goals Expected length of stay: 7 days Cultural Considerations: Speaks [place] and [place].  Has been in the [place] for 30 years. Dietary Needs: Heart diet, thin liquids Equipment Needs: TBD Pt/Family Agrees to Admission and willing to participate: Yes Program Orientation Provided & Reviewed with Pt/Caregiver Including Roles  & Responsibilities: Yes Additional Information Needs: Patient is worried about the cost of rehab.  He has no insurance.  He wants to stay a week and then  plans for daughter to assist at home after discharge.  Decrease burden of Care through IP rehab admission: N/A  Possible need for SNF placement upon discharge: Not anticipated  Patient Condition: This patient's condition remains as documented in the consult dated 10/26/15, in which the Rehabilitation Physician determined and documented that the patient's condition is appropriate for intensive rehabilitative care in an inpatient rehabilitation facility pending caregiver support.  These areas have been addressed. Patient's daughter plans to stay with him and assist after discharge from rehab.  Will admit to inpatient rehab today.  Preadmission Screen Completed By:  Trish MageLogue, Charlena Haub M, 10/27/2015 2:10 PM ______________________________________________________________________   Discussed status with Dr. Allena KatzPatel on 10/27/15 at 1405 and received telephone approval for admission today.  Admission Coordinator:  Trish MageLogue, Nada Godley M, time1405/Date07/11/17

## 2015-10-27 NOTE — Progress Notes (Signed)
Physical Therapy Treatment Patient DetaiLeveon Tran Brad Tran MRN: [id number] DOB: 1965/07/06 Today's Date: 10/27/2015    History of Present Illness 50 yo male admitted with Acute L basal ganglia infarct and remote L thalamus lacunar infarct PMH: HTN    PT Comments    Pt demonstrated increased muscle activation on R LE for greater ease of bed mobility. He was able to ambulate 15 ft x 2 with hemi walker and +2 assist for balance and cueing. Pt demonstrated ability to step with R LE with verbal cueing and min to no manual assist. He tolerated treatment well today, but complained of being very warm at the end of session. BP elevated at 170/102. RN notified and aware. He continues to progress toward his goals. PT continues to recommend CIR for increased progress toward independence at home. Continue acute rehab follow.   Follow Up Recommendations  CIR     Equipment Recommendations  Other (comment) (hemiwalker)    Recommendations for Other Services Rehab consult     Precautions / Restrictions Precautions Precautions: Fall Restrictions Weight Bearing Restrictions: No    Mobility  Bed Mobility Overal bed mobility: Needs Assistance Bed Mobility: Supine to Sit;Sit to Supine     Supine to sit: Min guard Sit to supine: Min guard   General bed mobility comments: Pt making progress to use L bed rail with L UE and compensating with R hip flexors to move R LE.  Transfers Overall transfer level: Needs assistance Equipment used: Hemi-walker Transfers: Sit to/from [employer] Sit to Stand: Min assist Stand pivot transfers: Max assist       General transfer comment: Pt able to power up but is still impulsive and has decreased awareness of R weakness with standing   Ambulation/Gait Ambulation/Gait assistance: Mod assist;+2 physical assistance Ambulation Distance (Feet): 30 Feet Assistive device: Hemi-walker Gait Pattern/deviations: Step-to pattern Gait velocity:  slow Gait velocity interpretation: Below normal speed for age/gender General Gait Details: Hemi walker in L hand, 1 person on right for support and guidance of R LE, 1 person in front for forward support. Pt requires max verbal cueing for seqencing.    Stairs            Wheelchair Mobility    Modified Rankin (Stroke Patients Only) Modified Rankin (Stroke Patients Only) Pre-Morbid Rankin Score: No symptoms Modified Rankin: Moderately severe disability     Balance Overall balance assessment: Needs assistance Sitting-balance support: Feet supported;Single extremity supported Sitting balance-Leahy Scale: Fair Sitting balance - Comments: Pt with increased awareness of R leg and foot placement while seated.   Standing balance support: Single extremity supported Standing balance-Leahy Scale: Fair Standing balance comment: Pt able to stand at hall rails with L UE supported to tolerate weight shifting.                    Cognition Arousal/Alertness: Awake/alert Behavior During Therapy: WFL for tasks assessed/performed;Impulsive Overall Cognitive Status: Within Functional Limits for tasks assessed                      Exercises General Exercises - Lower Extremity Quad Sets: AROM;Right;10 reps;Supine Gluteal Sets: AROM;10 reps;Both;Supine Heel Slides: AROM;Right;10 reps;Supine Straight Leg Raises: 10 reps;AAROM;Right;Supine Hip Flexion/Marching: Standing;5 reps;AROM;Right;Other (comment) (Standing at hall rails, step forward/back with R LE)    General Comments General comments (skin integrity, edema, etc.): Pt able to take steps with R LE using hemiwalker today and required min manual guidance for foot placement  Pertinent Vitals/Pain Pain Assessment: No/denies pain    Home Living                      Prior Function            PT Goals (current goals can now be found in the care plan section) Acute Rehab PT Goals Patient Stated Goal:  return to prior level of function Progress towards PT goals: Progressing toward goals    Frequency  Min 4X/week    PT Plan Current plan remains appropriate    Co-evaluation             End of Session Equipment Utilized During Treatment: Gait belt Activity Tolerance: Patient tolerated treatment well;Other (comment) (Pt was very warm at end of session. BP 170/102.) Patient left: in bed;with call bell/phone within reach;with bed alarm set;with family/visitor present     Time: 1308-65781327-1404 PT Time Calculation (min) (ACUTE ONLY): 37 min  Charges:  $Gait Training: 8-22 mins $Neuromuscular Re-education: 8-22 mins                    G Codes:      Dreama SaaCara Avereigh Spainhower 10/27/2015, 4:34 PM  Park Literara A Tekeisha Hakim, SPT (student physical therapist) Acute Rehabilitation Services [phone]

## 2015-10-27 NOTE — Progress Notes (Signed)
Occupational Therapy Treatment Patient Details Name: Brad BrooksChhoun Sossamon MRN: [id number] DOB: 07/07/1965 Today's Date: 10/27/2015    History of present illness 50 yo male admitted with Acute L basal ganglia infarct and remote L thalamus lacunar infarct PMH: HTN   OT comments  Pt completed sink level grooming this session. Pt noted with oral care to have food pocketing. Pt food expelled minimal but present. Pt with R LE knee buckle at sink x1 . Pt needs cues during session to attend to R UE due to lack of awareness to R UE positioning. Pt educated to sit with R UE elevated on pillow in chair to prevent edema and injury. Pt expressed understanding and self positioned R UE.   Follow Up Recommendations  CIR    Equipment Recommendations  Other (comment)    Recommendations for Other Services Rehab consult    Precautions / Restrictions Precautions Precautions: Fall       Mobility Bed Mobility Overal bed mobility: Needs Assistance Bed Mobility: Supine to Sit     Supine to sit: Min guard     General bed mobility comments: heavy use of L bed rail and L UE. pt allowing R UE to remain abducted to the body with attempt. Pt needed v/c at EOB to locate R ue to reposition to prevent injury. pt educated on R UE placement prior to mobility.   Transfers Overall transfer level: Needs assistance   Transfers: Sit to/from Stand Sit to Stand: Min assist         General transfer comment: pt able to power up with bil LE without UE use. pt does reach for environmental supports ( chair arm rest) once in standing for balance. Pt encouraged to avoid doing this     Balance Overall balance assessment: Needs assistance Sitting-balance support: Single extremity supported;Feet supported Sitting balance-Leahy Scale: Fair     Standing balance support: No upper extremity supported;During functional activity Standing balance-Leahy Scale: Fair Standing balance comment: R Ue placed on counter for support and  help with balance. pt with x1 LOB at sink due to R knee weakness                   ADL Overall ADL's : Needs assistance/impaired Eating/Feeding: Set up;Sitting Eating/Feeding Details (indicate cue type and reason): pt reports "i can get it" without awareness to inability to cut food Grooming: Wash/dry hands;Wash/dry face;Oral care;Minimal assistance;Standing Grooming Details (indicate cue type and reason): Pt washing R hand showing awareness to R side without cues. pt washing face and needed cues to use wash cloth. Question if this could be personal choice vs awareness. pt needed cues to sequence oral care. pt attempting to use R hand with lack of awareness to R hand deficits during task. Pt once initiating task needed (A) due to R hand flaccid.              Lower Body Dressing: Moderate assistance;Bed level Lower Body Dressing Details (indicate cue type and reason): don socks. Able to cross R LE over L LE this session with min (A). pt needed (A) to thread toes into sock but able to use L hand to don socks Toilet Transfer: Minimal assistance Toilet Transfer Details (indicate cue type and reason): simulated EOB to chair         Functional mobility during ADLs: Moderate assistance General ADL Comments: Pt don socks supine in bed with (A). pt able to complete bed mobility with min guard this session. pt transfered from EOB to  sink level for grooming. pt needed mod cues to sequence transfer. pt noted to have R foot supination and adduction with transfer. pt needed cues to widen BOS.       Vision                     Perception     Praxis      Cognition   Behavior During Therapy: WFL for tasks assessed/performed Overall Cognitive Status: Within Functional Limits for tasks assessed                       Extremity/Trunk Assessment               Exercises     Shoulder Instructions       General Comments      Pertinent Vitals/ Pain       Pain  Assessment: No/denies pain  Home Living                                          Prior Functioning/Environment              Frequency Min 2X/week     Progress Toward Goals  OT Goals(current goals can now be found in the care plan section)  Progress towards OT goals: Progressing toward goals  Acute Rehab OT Goals Patient Stated Goal: return to prior level of function OT Goal Formulation: With patient Time For Goal Achievement: 11/09/15 Potential to Achieve Goals: Good ADL Goals Pt Will Perform Grooming: with supervision;sitting Pt Will Perform Upper Body Bathing: with supervision;sitting Pt Will Transfer to Toilet: with min guard assist;bedside commode Additional ADL Goal #1: Pt will complete basic transfer min guard (A) as precursor to adls.   Plan Discharge plan remains appropriate    Co-evaluation                 End of Session Equipment Utilized During Treatment: Gait belt   Activity Tolerance Patient tolerated treatment well   Patient Left in chair;with call bell/phone within reach;with chair alarm set (eating breakfast)   Nurse Communication Mobility status;Precautions        Time: 9604-5409 OT Time Calculation (min): 24 min  Charges: OT General Charges $OT Visit: 1 Procedure OT Treatments $Self Care/Home Management : 23-37 mins  Boone Master B 10/27/2015, 7:53 AM  Mateo Flow   OTR/L Pager: [phone] Office: [phone] .

## 2015-10-27 NOTE — H&P (View-Only) (Signed)
Physical Medicine and Rehabilitation Admission H&P    Chief Complaint  Patient presents with  . Stroke Symptoms  : HPI: Brad Tran is a 50 y.o. right handed limited English-speaking male with unremarkable past medical history on no scheduled medications at time of admission. Per chart review patient lives with family. Independent prior to admission. Family works during the day. One level home. Presented 10/25/2015 with right-sided weakness and slurred speech. Urine drug screen negative. Cranial CT scan with no acute changes. 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 MRI of the brain showed acute left basal ganglia nonhemorrhagic infarct. MRA with no emergent large vessel occlusion or stenosis. Carotid Dopplers had no ICA stenosis. Echocardiogram is pending. Patient did not receive TPA. Neurology follow-up currently on aspirin for CVA prophylaxis. Subcutaneous Lovenox for DVT prophylaxis. Tolerating a regular diet. Physical therapy evaluation completed with recommendations of physical medicine rehabilitation consult.Patient was admitted for comprehensive rehabilitation program  ROS Constitutional:   Right sided weakness  HENT: Negative for hearing loss.  Eyes: Negative for blurred vision and double vision.  Respiratory: Negative for cough and shortness of breath.  Cardiovascular: Negative for chest pain, palpitations and leg swelling.  Gastrointestinal: Positive for constipation. Negative for nausea and vomiting.  Genitourinary: Negative for dysuria and hematuria.  Musculoskeletal: Positive for myalgias.  Skin: Negative for rash.  Neurological: Positive for speech change and focal weakness. Negative for seizures and headaches.  All other systems reviewed and are negative   History reviewed. No pertinent past medical history. History reviewed. No pertinent past surgical history. Family History  Problem Relation Age of Onset  . Stroke Father   . Heart attack Father     Social History:  reports that he has never smoked. He has never used smokeless tobacco. He reports that he drinks alcohol. He reports that he does not use illicit drugs. Allergies: No Known Allergies No prescriptions prior to admission    Home: Home Living Family/patient expects to be discharged to:: Private residence Living Arrangements: Other relatives Available Help at Discharge: Family, Friend(s), Available PRN/intermittently Type of Home: House Home Access: Level entry Home Layout: One level Home Equipment: None  Lives With: Spouse   Functional History: Prior Function Level of Independence: Independent Comments: per pt, was driving  Functional Status:  Mobility: Bed Mobility Overal bed mobility: Needs Assistance Bed Mobility: Supine to Sit, Sit to Supine Supine to sit: Min assist, HOB elevated Sit to supine: Mod assist General bed mobility comments: to Left EOB, pt struggles to move right hemibody to bed's edge, needs cues to assist especially arm and to square self at edge; instructed how to assist leg onto bed surface with intact foot behind ankle, needs assist to complete and min assist to scoot up in flat bed Transfers Overall transfer level: Needs assistance Equipment used: None Transfers: Sit to/from Stand, Stand Pivot Transfers Sit to Stand: Mod assist Stand pivot transfers: Mod assist General transfer comment: needs assist to sequence sit>stand and cues to align with surface prior to sitting; able to bear weight on impaired leg, holds arm in flexion posture during transfer 90 degree to chair and back to bed; delayed stepping pattern with min assist to shift weight; LOB x1 returning to bed, assisted to bed surface without issue Ambulation/Gait General Gait Details: pivotal steps to/from chair only; pt fatigued    ADL:    Cognition: Cognition Overall Cognitive Status: No family/caregiver present to determine baseline cognitive  functioning Arousal/Alertness: Awake/alert Orientation Level: Oriented to person, Oriented  to place, Oriented to time, Disoriented to situation Attention: Sustained Sustained Attention: Appears intact Memory: Impaired Memory Impairment: Retrieval deficit, Storage deficit, Decreased short term memory, Decreased recall of new information Decreased Short Term Memory: Verbal basic Awareness: Appears intact Problem Solving: Appears intact Safety/Judgment: Appears intact Cognition Arousal/Alertness: Awake/alert (sleepy by end, easily aroused) Behavior During Therapy: WFL for tasks assessed/performed Overall Cognitive Status: No family/caregiver present to determine baseline cognitive functioning Difficult to assess due to: Level of arousal  Physical Exam: Blood pressure 162/107, pulse 87, temperature 98.6 F (37 C), temperature source Oral, resp. rate 20, height '5\' 3"'$  (1.6 m), weight 79.742 kg (175 lb 12.8 oz), SpO2 98 %. Physical Exam Constitutional: He is oriented to person, place, and time. He appears well-developed and well-nourished.  HENT:  Head: Normocephalic and atraumatic.  Eyes: Conjunctivae and EOM are normal.  Neck: Normal range of motion. Neck supple. No thyromegaly present.  Cardiovascular: Normal rate and regular rhythm.  Respiratory: Effort normal and breath sounds normal. No respiratory distress.  GI: Soft. Bowel sounds are normal. He exhibits no distension.  Musculoskeletal: He exhibits no edema or tenderness.  Neurological: He is alert and oriented to person, place, and time.  Limited English-speaking male.  Follows simple commands. Sensation intact to light touch Motor: RUE: /0/5 RLE: hip flexion knee extension 4-/5, ankle dorsi/plantar flexion 0/5 LUE/LLE: 5/5 proximal to distal DTRs symmetric  Skin: Skin is warm and dry.  Psychiatric: He has a normal mood and affect. His behavior is normal  Results for orders placed or performed during the hospital encounter  of 10/25/15 (from the past 48 hour(s))  Protime-INR     Status: None   Collection Time: 10/25/15  1:27 AM  Result Value Ref Range   Prothrombin Time 12.7 11.6 - 15.2 seconds   INR 0.93 0.00 - 1.49  APTT     Status: None   Collection Time: 10/25/15  1:27 AM  Result Value Ref Range   aPTT 34 24 - 37 seconds  CBC     Status: Abnormal   Collection Time: 10/25/15  1:27 AM  Result Value Ref Range   WBC 8.5 4.0 - 10.5 K/uL   RBC 5.56 4.22 - 5.81 MIL/uL   Hemoglobin 16.6 13.0 - 17.0 g/dL   HCT 45.2 39.0 - 52.0 %   MCV 81.3 78.0 - 100.0 fL   MCH 29.9 26.0 - 34.0 pg   MCHC 36.7 (H) 30.0 - 36.0 g/dL   RDW 12.9 11.5 - 15.5 %   Platelets 197 150 - 400 K/uL  Differential     Status: None   Collection Time: 10/25/15  1:27 AM  Result Value Ref Range   Neutrophils Relative % 65 %   Neutro Abs 5.5 1.7 - 7.7 K/uL   Lymphocytes Relative 24 %   Lymphs Abs 2.0 0.7 - 4.0 K/uL   Monocytes Relative 7 %   Monocytes Absolute 0.6 0.1 - 1.0 K/uL   Eosinophils Relative 5 %   Eosinophils Absolute 0.4 0.0 - 0.7 K/uL   Basophils Relative 0 %   Basophils Absolute 0.0 0.0 - 0.1 K/uL  Comprehensive metabolic panel     Status: Abnormal   Collection Time: 10/25/15  1:27 AM  Result Value Ref Range   Sodium 138 135 - 145 mmol/L   Potassium 3.8 3.5 - 5.1 mmol/L   Chloride 105 101 - 111 mmol/L   CO2 26 22 - 32 mmol/L   Glucose, Bld 105 (H) 65 - 99 mg/dL  BUN 23 (H) 6 - 20 mg/dL   Creatinine, Ser 0.91 0.61 - 1.24 mg/dL   Calcium 9.2 8.9 - 10.3 mg/dL   Total Protein 8.0 6.5 - 8.1 g/dL   Albumin 4.7 3.5 - 5.0 g/dL   AST 25 15 - 41 U/L   ALT 45 17 - 63 U/L   Alkaline Phosphatase 66 38 - 126 U/L   Total Bilirubin 0.7 0.3 - 1.2 mg/dL   GFR calc non Af Amer >60 >60 mL/min   GFR calc Af Amer >60 >60 mL/min    Comment: (NOTE) The eGFR has been calculated using the CKD EPI equation. This calculation has not been validated in all clinical situations. eGFR's persistently <60 mL/min signify possible Chronic  Kidney Disease.    Anion gap 7 5 - 15  CBG monitoring, ED     Status: None   Collection Time: 10/25/15  1:28 AM  Result Value Ref Range   Glucose-Capillary 99 65 - 99 mg/dL  I-stat troponin, ED     Status: None   Collection Time: 10/25/15  1:38 AM  Result Value Ref Range   Troponin i, poc 0.00 0.00 - 0.08 ng/mL   Comment 3            Comment: Due to the release kinetics of cTnI, a negative result within the first hours of the onset of symptoms does not rule out myocardial infarction with certainty. If myocardial infarction is still suspected, repeat the test at appropriate intervals.   I-Stat Chem 8, ED     Status: Abnormal   Collection Time: 10/25/15  1:40 AM  Result Value Ref Range   Sodium 140 135 - 145 mmol/L   Potassium 3.8 3.5 - 5.1 mmol/L   Chloride 104 101 - 111 mmol/L   BUN 24 (H) 6 - 20 mg/dL   Creatinine, Ser 1.10 0.61 - 1.24 mg/dL   Glucose, Bld 103 (H) 65 - 99 mg/dL   Calcium, Ion 1.13 1.13 - 1.30 mmol/L   TCO2 25 0 - 100 mmol/L   Hemoglobin 16.7 13.0 - 17.0 g/dL   HCT 49.0 39.0 - 52.0 %  Glucose, capillary     Status: Abnormal   Collection Time: 10/25/15  6:50 AM  Result Value Ref Range   Glucose-Capillary 106 (H) 65 - 99 mg/dL   Comment 1 Notify RN    Comment 2 Document in Chart   Hemoglobin A1c     Status: Abnormal   Collection Time: 10/25/15  7:47 AM  Result Value Ref Range   Hgb A1c MFr Bld 5.7 (H) 4.8 - 5.6 %    Comment: (NOTE)         Pre-diabetes: 5.7 - 6.4         Diabetes: >6.4         Glycemic control for adults with diabetes: <7.0    Mean Plasma Glucose 117 mg/dL    Comment: (NOTE) Performed At: [facility] [place], [place] [id number] Lindon Romp MD SR:[id number]   Lipid panel     Status: Abnormal   Collection Time: 10/25/15  7:47 AM  Result Value Ref Range   Cholesterol 269 (H) 0 - 200 mg/dL   Triglycerides 269 (H) <150 mg/dL   HDL 39 (L) >40 mg/dL   Total CHOL/HDL Ratio 6.9 RATIO   VLDL 54 (H) 0 - 40  mg/dL   LDL Cholesterol 176 (H) 0 - 99 mg/dL    Comment:  Total Cholesterol/HDL:CHD Risk Coronary Heart Disease Risk Table                     Men   Women  1/2 Average Risk   3.4   3.3  Average Risk       5.0   4.4  2 X Average Risk   9.6   7.1  3 X Average Risk  23.4   11.0        Use the calculated Patient Ratio above and the CHD Risk Table to determine the patient's CHD Risk.        ATP III CLASSIFICATION (LDL):  <100     mg/dL   Optimal  110-475  mg/dL   Near or Above                    Optimal  130-159  mg/dL   Borderline  747-139  mg/dL   High  >320     mg/dL   Very High   Urine rapid drug screen (hosp performed)     Status: None   Collection Time: 10/25/15  9:31 AM  Result Value Ref Range   Opiates NONE DETECTED NONE DETECTED   Cocaine NONE DETECTED NONE DETECTED   Benzodiazepines NONE DETECTED NONE DETECTED   Amphetamines NONE DETECTED NONE DETECTED   Tetrahydrocannabinol NONE DETECTED NONE DETECTED   Barbiturates NONE DETECTED NONE DETECTED    Comment:        DRUG SCREEN FOR MEDICAL PURPOSES ONLY.  IF CONFIRMATION IS NEEDED FOR ANY PURPOSE, NOTIFY LAB WITHIN 5 DAYS.        LOWEST DETECTABLE LIMITS FOR URINE DRUG SCREEN Drug Class       Cutoff (ng/mL) Amphetamine      1000 Barbiturate      200 Benzodiazepine   200 Tricyclics       300 Opiates          300 Cocaine          300 THC              50   Glucose, capillary     Status: Abnormal   Collection Time: 10/25/15 10:58 AM  Result Value Ref Range   Glucose-Capillary 101 (H) 65 - 99 mg/dL  Glucose, capillary     Status: Abnormal   Collection Time: 10/25/15  4:12 PM  Result Value Ref Range   Glucose-Capillary 108 (H) 65 - 99 mg/dL  Glucose, capillary     Status: Abnormal   Collection Time: 10/25/15  9:15 PM  Result Value Ref Range   Glucose-Capillary 116 (H) 65 - 99 mg/dL   Comment 1 Notify RN    Comment 2 Document in Chart   Glucose, capillary     Status: None   Collection Time: 10/26/15   6:44 AM  Result Value Ref Range   Glucose-Capillary 97 65 - 99 mg/dL   Comment 1 Notify RN    Comment 2 Document in Chart   Glucose, capillary     Status: Abnormal   Collection Time: 10/26/15 11:33 AM  Result Value Ref Range   Glucose-Capillary 118 (H) 65 - 99 mg/dL   Comment 1 Notify RN    Comment 2 Document in Chart    Mr Brain Wo Contrast  10/25/2015  CLINICAL DATA:  Dysarthria, difficulty walking beginning today. Feeling unwell. Assess ischemic stroke. EXAM: MRI HEAD WITHOUT CONTRAST MRA HEAD WITHOUT CONTRAST TECHNIQUE: Multiplanar, multiecho pulse sequences of the brain and  surrounding structures were obtained without intravenous contrast. Angiographic images of the head were obtained using MRA technique without contrast. COMPARISON:  None. CT HEAD October 25, 2015 at 0136 hours FINDINGS: MRI HEAD FINDINGS INTRACRANIAL CONTENTS: 11 x 32 mm (transverse by AP) reduced diffusion LEFT posterior caudate body and tail and, posterior LEFT putaminal with corresponding low ADC values. No susceptibility artifact to suggest hemorrhage. The ventricles and sulci are normal for patient's age. 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 No suspicious parenchymal signal, masses or mass effect. No abnormal extra-axial fluid collections. No extra-axial masses though, contrast enhanced sequences would be more sensitive. Normal major intracranial vascular flow voids present at skull base. ORBITS: The included ocular globes and orbital contents are non-suspicious. SINUSES: RIGHT maxillary mucosal retention cyst. Minimal paranasal sinus mucosal thickening. Mastoid air cells are well aerated. SKULL/SOFT TISSUES: No abnormal sellar expansion. No suspicious calvarial bone marrow signal. Craniocervical junction maintained. MRA HEAD FINDINGS ANTERIOR CIRCULATION: Normal flow related enhancement of the included cervical, petrous, cavernous and supraclinoid internal carotid arteries. Patent anterior communicating artery. Bilateral anterior  cerebral arteries arise from RIGHT A1-2 junction with tiny LEFT A1 segment. No large vessel occlusion, high-grade stenosis, aneurysm. Mild luminal irregularity anterior and middle cerebral arteries. POSTERIOR CIRCULATION: Codominant vertebral artery's. Basilar artery is patent, with normal flow related enhancement of the main branch vessels. Fenestrated proximal basilar artery. Fetal origin RIGHT posterior cerebral artery. Robust LEFT posterior communicating artery. Normal flow related enhancement of the posterior cerebral arteries. No large vessel occlusion, high-grade stenosis, aneurysm. Mild luminal irregularity of the posterior cerebral arteries. IMPRESSION: MRI HEAD: Acute LEFT basal ganglia nonhemorrhagic infarct. 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 MRA HEAD: No emergent large vessel occlusion or severe stenosis. Mild luminal irregularity of the cerebral arteries compatible with atherosclerosis. Electronically Signed   By: Elon Alas M.D.   On: 10/25/2015 06:03   Mr Jodene Nam Head/brain Wo Cm  10/25/2015  CLINICAL DATA:  Dysarthria, difficulty walking beginning today. Feeling unwell. Assess ischemic stroke. EXAM: MRI HEAD WITHOUT CONTRAST MRA HEAD WITHOUT CONTRAST TECHNIQUE: Multiplanar, multiecho pulse sequences of the brain and surrounding structures were obtained without intravenous contrast. Angiographic images of the head were obtained using MRA technique without contrast. COMPARISON:  None. CT HEAD October 25, 2015 at 0136 hours FINDINGS: MRI HEAD FINDINGS INTRACRANIAL CONTENTS: 11 x 32 mm (transverse by AP) reduced diffusion LEFT posterior caudate body and tail and, posterior LEFT putaminal with corresponding low ADC values. No susceptibility artifact to suggest hemorrhage. The ventricles and sulci are normal for patient's age. 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 No suspicious parenchymal signal, masses or mass effect. No abnormal extra-axial fluid collections. No extra-axial masses though, contrast  enhanced sequences would be more sensitive. Normal major intracranial vascular flow voids present at skull base. ORBITS: The included ocular globes and orbital contents are non-suspicious. SINUSES: RIGHT maxillary mucosal retention cyst. Minimal paranasal sinus mucosal thickening. Mastoid air cells are well aerated. SKULL/SOFT TISSUES: No abnormal sellar expansion. No suspicious calvarial bone marrow signal. Craniocervical junction maintained. MRA HEAD FINDINGS ANTERIOR CIRCULATION: Normal flow related enhancement of the included cervical, petrous, cavernous and supraclinoid internal carotid arteries. Patent anterior communicating artery. Bilateral anterior cerebral arteries arise from RIGHT A1-2 junction with tiny LEFT A1 segment. No large vessel occlusion, high-grade stenosis, aneurysm. Mild luminal irregularity anterior and middle cerebral arteries. POSTERIOR CIRCULATION: Codominant vertebral artery's. Basilar artery is patent, with normal flow related enhancement of the main branch vessels. Fenestrated proximal basilar artery. Fetal origin RIGHT posterior cerebral artery. Robust LEFT posterior communicating  artery. Normal flow related enhancement of the posterior cerebral arteries. No large vessel occlusion, high-grade stenosis, aneurysm. Mild luminal irregularity of the posterior cerebral arteries. IMPRESSION: MRI HEAD: Acute LEFT basal ganglia nonhemorrhagic infarct. 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 MRA HEAD: No emergent large vessel occlusion or severe stenosis. Mild luminal irregularity of the cerebral arteries compatible with atherosclerosis. Electronically Signed   By: Elon Alas M.D.   On: 10/25/2015 06:03   Ct Head Code Stroke W/o Cm  10/25/2015  CLINICAL DATA:  Acute onset of slurred speech. Code stroke. Initial encounter. EXAM: CT HEAD WITHOUT CONTRAST TECHNIQUE: Contiguous axial images were obtained from the base of the skull through the vertex without intravenous contrast. COMPARISON:   None. FINDINGS: There is no evidence of acute infarction, mass lesion, or intra- or extra-axial hemorrhage on CT. A small chronic infarct is noted at the left thalamus. The posterior fossa, including the cerebellum, brainstem and fourth ventricle, is within normal limits. The third and lateral ventricles are unremarkable in appearance. The cerebral hemispheres are symmetric in appearance, with normal gray-white differentiation. No mass effect or midline shift is seen. There is no evidence of fracture; visualized osseous structures are unremarkable in appearance. The orbits are within normal limits. A small mucus retention cyst or polyp is noted at the right maxillary sinus. The remaining paranasal sinuses and mastoid air cells are well-aerated. No significant soft tissue abnormalities are seen. IMPRESSION: 1. No acute intracranial pathology seen on CT. 2. 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 3. Small mucus retention cyst or polyp at the right maxillary sinus. These results were called by telephone at the time of interpretation on 10/25/2015 at 1:40 am to Dr. Jola Schmidt, who verbally acknowledged these results. Electronically Signed   By: Garald Balding M.D.   On: 10/25/2015 01:40       Medical Problem List and Plan: 1.  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  DVT Prophylaxis/Anticoagulation: Subcutaneous Lovenox. Monitor for any bleeding episodes 3. Pain Management: Tylenol as needed 4. Hyperlipidemia. Lipitor 5. Neuropsych: This patient is capable of making decisions on his own behalf. 6. Skin/Wound Care: Routine skin checks 7. Fluids/Electrolytes/Nutrition: Routine I&O's with follow-up chemistries 8. HTN: Monitor 9. Prediabetes: Monitor  Post Admission Physician Evaluation: Functional deficits secondary  to left basal ganglia corona radiata infarct. 1. Patient is admitted to receive collaborative, interdisciplinary care between the physiatrist, rehab  nursing staff, and therapy team. 2. Patient's level of medical complexity and substantial therapy needs in context of that medical necessity cannot be provided at a lesser intensity of care such as a SNF. 3. Patient has experienced substantial functional loss from his/her baseline which was documented above under the "Functional History" and "Functional Status" headings.  Judging by the patient's diagnosis, physical exam, and functional history, the patient has potential for functional progress which will result in measurable gains while on inpatient rehab.  These gains will be of substantial and practical use upon discharge  in facilitating mobility and self-care at the household level. 4. Physiatrist will provide 24 hour management of medical needs as well as oversight of the therapy plan/treatment and provide guidance as appropriate regarding the interaction of the two. 5. 24 hour rehab nursing will assist with safety, disease management and patient education and help integrate therapy concepts, techniques,education, etc. 6. PT will assess and treat for/with: Lower extremity strength, range of motion, stamina, balance, functional mobility, safety, adaptive techniques and equipment, woundcare, coping skills, pain control, education.  Goals are: Supervision/Min A. 7. OT will assess and treat for/with: ADL's, functional mobility, safety, upper extremity strength, adaptive techniques and equipment, wound mgt, ego support, and community reintegration.   Goals are: Supervision/Min A. Therapy may proceed with showering this patient. 8. Case Management and Social Worker will assess and treat for psychological issues and discharge planning. 9. Team conference will be held weekly to assess progress toward goals and to determine barriers to discharge. 10. Patient will receive at least 3 hours of therapy per day at least 5 days per week. 11. ELOS: 16-19 days.       12. Prognosis:  good  Delice Lesch, MD   10/26/2015

## 2015-10-27 NOTE — Progress Notes (Signed)
Speech Language Pathology Treatment: Cognitive-Linquistic  Patient Details Name: Brad Tran MRN: [id number] DOB: 01/24/1966 Today's Date: 10/27/2015 Time: [id number] SLP Time Calculation (min) (ACUTE ONLY): 13 min  Assessment / Plan / Recommendation Clinical Impression  Skilled treatment session focused on addressing cognition goals. Per chart review and discussion with OT SLP aware of restlessness, perseveration, and decreased awareness.  SLP facilitated session with a discussion regarding current deficits, patient able to state that his left leg and arm and weak, but did not know why.  SLP educated patient regarding what a stroke is and how it has impacted him.  Patient required Max assist multimodal cues to identify cognitive deficits.  Patient also required Mod verbal and visual cues to locate call bell and return demo of use.  Care plan modified to include awareness goals.  Continue with current plan of care.     HPI HPI: 50 y.o. (pt states year of birth is [id number] NOT [id number]) male who presents with dysarthria and trouble walking. MRI shows acute left BG ischemic infarct and old left thalamus lacunar infarct.      SLP Plan  Continue with current plan of care     Recommendations                Follow up Recommendations: Inpatient Rehab Plan: Continue with current plan of care     GO              Charlane FerrettiMelissa Masaru Chamberlin, M.A., CCC-SLP [phone]   Brad Tran 10/27/2015, 10:38 AM

## 2015-10-27 NOTE — Progress Notes (Signed)
Rehab admissions - I spoke with patient this am.  He has decided to discharge directly home and not admit to inpatient rehab.  He has no insurance and he does not want to be responsible for the rehab bill.  His family has discussed this with patient and all are in agreement.  He will have assistance at home after discharge.  Call me for questions.  #[phone]

## 2015-10-27 NOTE — Evaluation (Signed)
Speech Language Pathology Evaluation Patient Details Name: Brad BrooksChhoun Mao MRN: [id number] DOB: 06/29/1965 Today's Date: 10/27/2015 Time:  -     Problem List:  Patient Active Problem List   Diagnosis Date Noted  . 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 Prediabetes   . Stroke (HCC) 10/25/2015  . CVA (cerebral infarction) 10/25/2015  . Stroke (cerebrum) (HCC) 10/25/2015  . Essential hypertension 10/25/2015  . Hyperlipidemia 10/25/2015  . Acute ischemic stroke (HCC)   . Cerebral infarction due to unspecified mechanism   . Dysarthria    Past Medical History: History reviewed. No pertinent past medical history. Past Surgical History: History reviewed. No pertinent past surgical history. HPI:  50 y.o. (pt states year of birth is [id number] NOT [birth year]) male who presents with dysarthria and trouble walking. MRI shows acute left BG ischemic infarct and old left thalamus lacunar infarct.   Assessment / Plan / Recommendation Clinical Impression  Suspect pt is close to cognitive-linguistic baseline, no family present. Scored in the severe impairment range for 4 word memory recall specifically with storage of new information. Awareness, problem solving, orientation, attention and speech intelligibility WFL's. Pt asking about prognosis of right arm. He would benefit from continued ST to address working memory.       SLP Assessment  Patient needs continued Speech Lanaguage Pathology Services    Follow Up Recommendations  Inpatient Rehab    Frequency and Duration min 2x/week  2 weeks      SLP Evaluation Prior Functioning  Cognitive/Linguistic Baseline: Within functional limits Type of Home: House  Lives With: Spouse Education:  (2 yrs college) Vocation: Full time employment ([employer])   Cognition  Overall Cognitive Status: No family/caregiver present to determine baseline cognitive functioning Arousal/Alertness: Awake/alert Orientation Level: Oriented  X4 Attention: Sustained Sustained Attention: Appears intact Memory: Impaired Memory Impairment: Retrieval deficit;Storage deficit;Decreased short term memory;Decreased recall of new information Decreased Short Term Memory: Verbal basic Awareness: Appears intact Problem Solving: Appears intact Safety/Judgment: Appears intact    Comprehension  Auditory Comprehension Overall Auditory Comprehension: Appears within functional limits for tasks assessed Visual Recognition/Discrimination Discrimination: Not tested Reading Comprehension Reading Status: Not tested (TBA)    Expression Expression Primary Mode of Expression: Verbal Verbal Expression Overall Verbal Expression: Appears within functional limits for tasks assessed Initiation: No impairment Level of Generative/Spontaneous Verbalization: Conversation Repetition: No impairment Naming: No impairment Pragmatics: No impairment Written Expression Dominant Hand: Right Written Expression:  (TBA)   Oral / Motor  Oral Motor/Sensory Function Overall Oral Motor/Sensory Function: Moderate impairment Facial ROM: Suspected CN VII (facial) dysfunction;Reduced right Facial Symmetry: Suspected CN VII (facial) dysfunction;Abnormal symmetry right Facial Strength: Reduced right;Suspected CN VII (facial) dysfunction Lingual ROM: Reduced right;Reduced left;Suspected CN XII (hypoglossal) dysfunction Lingual Symmetry: Within Functional Limits Lingual Strength: Reduced;Suspected CN XII (hypoglossal) dysfunction Mandible: Within Functional Limits Motor Speech Overall Motor Speech: Appears within functional limits for tasks assessed Articulation: Within functional limitis Intelligibility: Intelligible Motor Planning: Witnin functional limits   GO                    Royce MacadamiaLitaker, Keanan Melander Willis 10/27/2015, 9:03 AM  Breck CoonsLisa Willis Lonell FaceLitaker M.Ed [employer] Pager [phone]

## 2015-10-27 NOTE — Care Management Note (Signed)
Case Management Note  Patient Details  Name: Brad BrooksChhoun Imel MRN: [id number] Date of Birth: 04/11/1966  Subjective/Objective:                    Action/Plan: Pt discharging to CIR today. No further needs per CM.   Expected Discharge Date:                  Expected Discharge Plan:  IP Rehab Facility  In-House Referral:     Discharge planning Services  CM Consult  Post Acute Care Choice:    Choice offered to:     DME Arranged:    DME Agency:     HH Arranged:    HH Agency:     Status of Service:  Completed, signed off  If discussed at [employer] Length of Stay Meetings, dates discussed:    Additional Comments:  Kermit BaloKelli F Tandy Grawe, RN 10/27/2015, 2:41 PM

## 2015-10-28 ENCOUNTER — Inpatient Hospital Stay (HOSPITAL_COMMUNITY): Payer: Self-pay | Admitting: Physical Therapy

## 2015-10-28 ENCOUNTER — Inpatient Hospital Stay (HOSPITAL_COMMUNITY): Payer: Self-pay | Admitting: Occupational Therapy

## 2015-10-28 DIAGNOSIS — G8190 Hemiplegia, unspecified affecting unspecified side: Secondary | ICD-10-CM

## 2015-10-28 DIAGNOSIS — I63312 Cerebral infarction due to thrombosis of left middle cerebral artery: Secondary | ICD-10-CM

## 2015-10-28 LAB — COMPREHENSIVE METABOLIC PANEL
ALBUMIN: 4.1 g/dL (ref 3.5–5.0)
ALT: 50 U/L (ref 17–63)
ANION GAP: 7 (ref 5–15)
AST: 30 U/L (ref 15–41)
Alkaline Phosphatase: 54 U/L (ref 38–126)
BUN: 19 mg/dL (ref 6–20)
CO2: 27 mmol/L (ref 22–32)
Calcium: 9.6 mg/dL (ref 8.9–10.3)
Chloride: 105 mmol/L (ref 101–111)
Creatinine, Ser: 1.15 mg/dL (ref 0.61–1.24)
GFR calc Af Amer: >60 mL/min (ref >60)
GLUCOSE: 108 mg/dL — AB (ref 65–99)
POTASSIUM: 4.8 mmol/L (ref 3.5–5.1)
Sodium: 139 mmol/L (ref 135–145)
Total Bilirubin: 1.1 mg/dL (ref 0.3–1.2)
Total Protein: 7.3 g/dL (ref 6.5–8.1)

## 2015-10-28 LAB — CBC WITH DIFFERENTIAL/PLATELET
BASOS ABS: 0 10*3/uL (ref 0.0–0.1)
BASOS PCT: 0 %
EOS PCT: 6 %
Eosinophils Absolute: 0.4 10*3/uL (ref 0.0–0.7)
HCT: 49.2 % (ref 39.0–52.0)
Hemoglobin: 17 g/dL (ref 13.0–17.0)
Lymphocytes Relative: 24 %
Lymphs Abs: 1.4 10*3/uL (ref 0.7–4.0)
MCH: 29.4 pg (ref 26.0–34.0)
MCHC: 34.6 g/dL (ref 30.0–36.0)
MCV: 85 fL (ref 78.0–100.0)
MONO ABS: 0.4 10*3/uL (ref 0.1–1.0)
Monocytes Relative: 7 %
Neutro Abs: 3.6 10*3/uL (ref 1.7–7.7)
Neutrophils Relative %: 63 %
PLATELETS: 188 10*3/uL (ref 150–400)
RBC: 5.79 MIL/uL (ref 4.22–5.81)
RDW: 13.3 % (ref 11.5–15.5)
WBC: 5.8 10*3/uL (ref 4.0–10.5)

## 2015-10-28 MED ORDER — METOPROLOL TARTRATE 25 MG PO TABS
25.0000 mg | ORAL_TABLET | Freq: Two times a day (BID) | ORAL | Status: DC
  Administered 2015-10-28 – 2015-11-08 (×24): 25 mg via ORAL
  Filled 2015-10-28 (×25): qty 1

## 2015-10-28 NOTE — Progress Notes (Signed)
Ankit Karis JubaAnil Patel, MD Physician Signed Physical Medicine and Rehabilitation Consult Note 10/26/2015 6:10 AM  Related encounter: ED to Hosp-Admission (Discharged) [facility]    Expand All Collapse All        Physical Medicine and Rehabilitation Consult Reason for Consult: Left basal ganglia corona radiata infarct Referring Physician: [facility]   HPI: Brad Tran is a 50 y.o. right handed limited English-speaking male with unremarkable past medical history on no scheduled medications at time of admission. Per chart review patient lives with family. Independent prior to admission. Family works during the day. One level home. Presented 10/25/2015 with right-sided weakness and slurred speech. Urine drug screen negative. Cranial CT scan with no acute changes. 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 MRI of the brain showed acute left basal ganglia nonhemorrhagic infarct. MRA with no emergent large vessel occlusion or stenosis. Carotid Dopplers had no ICA stenosis. Echocardiogram is pending. Patient did not receive TPA. Neurology follow-up currently on aspirin for CVA prophylaxis. Subcutaneous Lovenox for DVT prophylaxis. Tolerating a regular diet. Physical therapy evaluation completed with recommendations of physical medicine rehabilitation consult.   Review of Systems  Constitutional:   Right sided weakness  HENT: Negative for hearing loss.  Eyes: Negative for blurred vision and double vision.  Respiratory: Negative for cough and shortness of breath.  Cardiovascular: Negative for chest pain, palpitations and leg swelling.  Gastrointestinal: Positive for constipation. Negative for nausea and vomiting.  Genitourinary: Negative for dysuria and hematuria.  Musculoskeletal: Positive for myalgias.  Skin: Negative for rash.  Neurological: Positive for speech change and focal weakness. Negative for seizures and headaches.  All other  systems reviewed and are negative.  History reviewed. No pertinent past medical history. History reviewed. No pertinent past surgical history. Family History  Problem Relation Age of Onset  . Stroke Father   . Heart attack Father    Social History:  reports that he has never smoked. He has never used smokeless tobacco. He reports that he drinks alcohol. He reports that he does not use illicit drugs. Allergies: No Known Allergies No prescriptions prior to admission    Home: Home Living Family/patient expects to be discharged to:: Private residence Living Arrangements: Other relatives Available Help at Discharge: Family, Friend(s), Available PRN/intermittently Type of Home: House Home Access: Level entry Home Layout: One level Home Equipment: None  Functional History: Prior Function Level of Independence: Independent Comments: per pt, was driving Functional Status:  Mobility: Bed Mobility Overal bed mobility: Needs Assistance Bed Mobility: Supine to Sit, Sit to Supine Supine to sit: Min assist, HOB elevated Sit to supine: Mod assist General bed mobility comments: to Left EOB, pt struggles to move right hemibody to bed's edge, needs cues to assist especially arm and to square self at edge; instructed how to assist leg onto bed surface with intact foot behind ankle, needs assist to complete and min assist to scoot up in flat bed Transfers Overall transfer level: Needs assistance Equipment used: None Transfers: Sit to/from Stand, Stand Pivot Transfers Sit to Stand: Mod assist Stand pivot transfers: Mod assist General transfer comment: needs assist to sequence sit>stand and cues to align with surface prior to sitting; able to bear weight on impaired leg, holds arm in flexion posture during transfer 90 degree to chair and back to bed; delayed stepping pattern with min assist to shift weight; LOB x1 returning to bed, assisted to bed surface without  issue Ambulation/Gait General Gait Details: pivotal  steps to/from chair only; pt fatigued    ADL:    Cognition: Cognition Overall Cognitive Status: Difficult to assess Orientation Level: Oriented to person, Oriented to place, Oriented to time, Disoriented to situation Cognition Arousal/Alertness: Awake/alert (sleepy by end, easily aroused) Behavior During Therapy: WFL for tasks assessed/performed Overall Cognitive Status: Difficult to assess Difficult to assess due to: Level of arousal  Blood pressure 157/105, pulse 75, temperature 98 F (36.7 C), temperature source Axillary, resp. rate 18, height 5\' 3"  (1.6 m), weight 79.742 kg (175 lb 12.8 oz), SpO2 98 %. Physical Exam  Vitals reviewed. Constitutional: He is oriented to person, place, and time. He appears well-developed and well-nourished.  HENT:  Head: Normocephalic and atraumatic.  Eyes: Conjunctivae and EOM are normal.  Neck: Normal range of motion. Neck supple. No thyromegaly present.  Cardiovascular: Normal rate and regular rhythm.  Respiratory: Effort normal and breath sounds normal. No respiratory distress.  GI: Soft. Bowel sounds are normal. He exhibits no distension.  Musculoskeletal: He exhibits no edema or tenderness.  Neurological: He is alert and oriented to person, place, and time.  Limited English-speaking male.  Follows simple commands. Sensation intact to light touch Motor: RUE/0/5, RLE: hip flexion knee extension 4-/5, ankle dorsi/plantar flexion 0/5 DTRs symmetric  Skin: Skin is warm and dry.  Psychiatric: He has a normal mood and affect. His behavior is normal.     Lab Results Last 24 Hours    Results for orders placed or performed during the hospital encounter of 10/25/15 (from the past 24 hour(s))  Lipid panel Status: Abnormal   Collection Time: 10/25/15 7:47 AM  Result Value Ref Range   Cholesterol 269 (H) 0 - 200 mg/dL   Triglycerides 161 (H) <150 mg/dL   HDL 39 (L)  >09 mg/dL   Total CHOL/HDL Ratio 6.9 RATIO   VLDL 54 (H) 0 - 40 mg/dL   LDL Cholesterol 604 (H) 0 - 99 mg/dL  Urine rapid drug screen (hosp performed) Status: None   Collection Time: 10/25/15 9:31 AM  Result Value Ref Range   Opiates NONE DETECTED NONE DETECTED   Cocaine NONE DETECTED NONE DETECTED   Benzodiazepines NONE DETECTED NONE DETECTED   Amphetamines NONE DETECTED NONE DETECTED   Tetrahydrocannabinol NONE DETECTED NONE DETECTED   Barbiturates NONE DETECTED NONE DETECTED      Imaging Results (Last 48 hours)    Mr Brain Wo Contrast  10/25/2015 CLINICAL DATA: Dysarthria, difficulty walking beginning today. Feeling unwell. Assess ischemic stroke. EXAM: MRI HEAD WITHOUT CONTRAST MRA HEAD WITHOUT CONTRAST TECHNIQUE: Multiplanar, multiecho pulse sequences of the brain and surrounding structures were obtained without intravenous contrast. Angiographic images of the head were obtained using MRA technique without contrast. COMPARISON: None. CT HEAD October 25, 2015 at 0136 hours FINDINGS: MRI HEAD FINDINGS INTRACRANIAL CONTENTS: 11 x 32 mm (transverse by AP) reduced diffusion LEFT posterior caudate body and tail and, posterior LEFT putaminal with corresponding low ADC values. No susceptibility artifact to suggest hemorrhage. The ventricles and sulci are normal for patient's age. 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 No suspicious parenchymal signal, masses or mass effect. No abnormal extra-axial fluid collections. No extra-axial masses though, contrast enhanced sequences would be more sensitive. Normal major intracranial vascular flow voids present at skull base. ORBITS: The included ocular globes and orbital contents are non-suspicious. SINUSES: RIGHT maxillary mucosal retention cyst. Minimal paranasal sinus mucosal thickening. Mastoid air cells are well aerated. SKULL/SOFT TISSUES: No abnormal sellar expansion. No suspicious calvarial bone marrow signal.  Craniocervical junction maintained.  MRA HEAD FINDINGS ANTERIOR CIRCULATION: Normal flow related enhancement of the included cervical, petrous, cavernous and supraclinoid internal carotid arteries. Patent anterior communicating artery. Bilateral anterior cerebral arteries arise from RIGHT A1-2 junction with tiny LEFT A1 segment. No large vessel occlusion, high-grade stenosis, aneurysm. Mild luminal irregularity anterior and middle cerebral arteries. POSTERIOR CIRCULATION: Codominant vertebral artery's. Basilar artery is patent, with normal flow related enhancement of the main branch vessels. Fenestrated proximal basilar artery. Fetal origin RIGHT posterior cerebral artery. Robust LEFT posterior communicating artery. Normal flow related enhancement of the posterior cerebral arteries. No large vessel occlusion, high-grade stenosis, aneurysm. Mild luminal irregularity of the posterior cerebral arteries. IMPRESSION: MRI HEAD: Acute LEFT basal ganglia nonhemorrhagic infarct. 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 MRA HEAD: No emergent large vessel occlusion or severe stenosis. Mild luminal irregularity of the cerebral arteries compatible with atherosclerosis. Electronically Signed By: Awilda Metro M.D. On: 10/25/2015 06:03   Mr Maxine Glenn Head/brain Wo Cm  10/25/2015 CLINICAL DATA: Dysarthria, difficulty walking beginning today. Feeling unwell. Assess ischemic stroke. EXAM: MRI HEAD WITHOUT CONTRAST MRA HEAD WITHOUT CONTRAST TECHNIQUE: Multiplanar, multiecho pulse sequences of the brain and surrounding structures were obtained without intravenous contrast. Angiographic images of the head were obtained using MRA technique without contrast. COMPARISON: None. CT HEAD October 25, 2015 at 0136 hours FINDINGS: MRI HEAD FINDINGS INTRACRANIAL CONTENTS: 11 x 32 mm (transverse by AP) reduced diffusion LEFT posterior caudate body and tail and, posterior LEFT putaminal with corresponding low ADC values. No susceptibility artifact to  suggest hemorrhage. The ventricles and sulci are normal for patient's age. 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 No suspicious parenchymal signal, masses or mass effect. No abnormal extra-axial fluid collections. No extra-axial masses though, contrast enhanced sequences would be more sensitive. Normal major intracranial vascular flow voids present at skull base. ORBITS: The included ocular globes and orbital contents are non-suspicious. SINUSES: RIGHT maxillary mucosal retention cyst. Minimal paranasal sinus mucosal thickening. Mastoid air cells are well aerated. SKULL/SOFT TISSUES: No abnormal sellar expansion. No suspicious calvarial bone marrow signal. Craniocervical junction maintained. MRA HEAD FINDINGS ANTERIOR CIRCULATION: Normal flow related enhancement of the included cervical, petrous, cavernous and supraclinoid internal carotid arteries. Patent anterior communicating artery. Bilateral anterior cerebral arteries arise from RIGHT A1-2 junction with tiny LEFT A1 segment. No large vessel occlusion, high-grade stenosis, aneurysm. Mild luminal irregularity anterior and middle cerebral arteries. POSTERIOR CIRCULATION: Codominant vertebral artery's. Basilar artery is patent, with normal flow related enhancement of the main branch vessels. Fenestrated proximal basilar artery. Fetal origin RIGHT posterior cerebral artery. Robust LEFT posterior communicating artery. Normal flow related enhancement of the posterior cerebral arteries. No large vessel occlusion, high-grade stenosis, aneurysm. Mild luminal irregularity of the posterior cerebral arteries. IMPRESSION: MRI HEAD: Acute LEFT basal ganglia nonhemorrhagic infarct. 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 MRA HEAD: No emergent large vessel occlusion or severe stenosis. Mild luminal irregularity of the cerebral arteries compatible with atherosclerosis. Electronically Signed By: Awilda Metro M.D. On: 10/25/2015 06:03   Ct Head Code Stroke W/o Cm  10/25/2015  CLINICAL DATA: Acute onset of slurred speech. Code stroke. Initial encounter. EXAM: CT HEAD WITHOUT CONTRAST TECHNIQUE: Contiguous axial images were obtained from the base of the skull through the vertex without intravenous contrast. COMPARISON: None. FINDINGS: There is no evidence of acute infarction, mass lesion, or intra- or extra-axial hemorrhage on CT. A small chronic infarct is noted at the left thalamus. The posterior fossa, including the cerebellum, brainstem and fourth ventricle, is within normal limits. The third and lateral ventricles are unremarkable in appearance.  The cerebral hemispheres are symmetric in appearance, with normal gray-white differentiation. No mass effect or midline shift is seen. There is no evidence of fracture; visualized osseous structures are unremarkable in appearance. The orbits are within normal limits. A small mucus retention cyst or polyp is noted at the right maxillary sinus. The remaining paranasal sinuses and mastoid air cells are well-aerated. No significant soft tissue abnormalities are seen. IMPRESSION: 1. No acute intracranial pathology seen on CT. 2. 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 3. Small mucus retention cyst or polyp at the right maxillary sinus. These results were called by telephone at the time of interpretation on 10/25/2015 at 1:40 am to Dr. Azalia Bilis, who verbally acknowledged these results. Electronically Signed By: Roanna Raider M.D. On: 10/25/2015 01:40     Assessment/Plan: Diagnosis: Left basal ganglia corona radiata infarct Labs and images independently reviewed. Records reviewed and summated above. Stroke: Continue secondary stroke prophylaxis and Risk Factor Modification listed below:  Antiplatelet therapy:  Blood Pressure Management: Continue current medication with prn's with permisive HTN per primary team Statin Agent:  Pre-diabetes management:  Right sided hemiparesis: fit for orthosis to prevent contractures  (resting hand splint for day, wrist cock up splint at night, PRAFO, etc Motor recovery: Fluoxetine  1. Does the need for close, 24 hr/day medical supervision in concert with the patient's rehab needs make it unreasonable for this patient to be served in a less intensive setting? Yes 2. Co-Morbidities requiring supervision/potential complications: Prediabetes, DM (Monitor in accordance with exercise and adjust meds as necessary), HTN (monitor and provide prns in accordance with increased physical exertion and pain), 3. Due to safety, disease management and patient education, does the patient require 24 hr/day rehab nursing? Yes 4. Does the patient require coordinated care of a physician, rehab nurse, PT (1-2 hrs/day, 5 days/week) and OT (1-2 hrs/day, 5 days/week) to address physical and functional deficits in the context of the above medical diagnosis(es)? Yes Addressing deficits in the following areas: balance, endurance, locomotion, strength, transferring, dressing, toileting and psychosocial support 5. Can the patient actively participate in an intensive therapy program of at least 3 hrs of therapy per day at least 5 days per week? Yes 6. The potential for patient to make measurable gains while on inpatient rehab is excellent 7. Anticipated functional outcomes upon discharge from inpatient rehab are supervision and min assist with PT, supervision and min assist with OT, n/a with SLP. 8. Estimated rehab length of stay to reach the above functional goals is: 16-19 days. 9. Does the patient have adequate social supports and living environment to accommodate these discharge functional goals? Potentially 10. Anticipated D/C setting: Home 11. Anticipated post D/C treatments: HH therapy and Home excercise program 12. Overall Rehab/Functional Prognosis: good  RECOMMENDATIONS: This patient's condition is appropriate for continued rehabilitative care in the following setting: Will need to clarify  availiability and support of "friend" at discharge. If caregiver available, would recommend CIR after completion of medical workup Patient has agreed to participate in recommended program. Potentially Note that insurance prior authorization may be required for reimbursement for recommended care.  Comment: Rehab Admissions Coordinator to follow up.  Maryla Morrow, MD 10/26/2015       Revision History     Date/Time User Provider Type Action   10/26/2015 11:00 AM Ankit Karis Juba, MD Physician Sign   10/26/2015 6:29 AM Charlton Amor, PA-C Physician Assistant [place]   View Details Report

## 2015-10-28 NOTE — Progress Notes (Signed)
50 y.o. right handed limited English-speaking male with unremarkable past medical history on no scheduled medications at time of admission. Per chart review patient lives with family. Independent prior to admission. Family works during the day. One level home. Presented 10/25/2015 with right-sided weakness and slurred speech. Urine drug screen negative. Cranial CT scan with no acute changes. 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 MRI of the brain showed acute left basal ganglia nonhemorrhagic infarct. MRA with no emergent large vessel occlusion or stenosis. Carotid Dopplers had no ICA stenosis. Echocardiogram is pending. Patient did not receive TPA. Neurology follow-up currently on aspirin for CVA prophylaxis. Subcutaneous Lovenox for DVT prophylaxis.  Subjective/Complaints: No issues overnite  ROS- denies pain or breathing issues  Objective: Vital Signs: Blood pressure 154/99, pulse 79, temperature 98.5 F (36.9 C), temperature source Oral, resp. rate 18, height '5\' 6"'$  (1.676 m), weight 68.13 kg (150 lb 3.2 oz), SpO2 98 %. No results found. Results for orders placed or performed during the hospital encounter of 10/27/15 (from the past 72 hour(s))  CBC     Status: None   Collection Time: 10/27/15  7:24 PM  Result Value Ref Range   WBC 6.4 4.0 - 10.5 K/uL   RBC 5.75 4.22 - 5.81 MIL/uL   Hemoglobin 16.5 13.0 - 17.0 g/dL   HCT 49.0 39.0 - 52.0 %   MCV 85.2 78.0 - 100.0 fL   MCH 28.7 26.0 - 34.0 pg   MCHC 33.7 30.0 - 36.0 g/dL   RDW 13.1 11.5 - 15.5 %   Platelets 202 150 - 400 K/uL  Creatinine, serum     Status: None   Collection Time: 10/27/15  7:24 PM  Result Value Ref Range   Creatinine, Ser 1.06 0.61 - 1.24 mg/dL   GFR calc non Af Amer >60 >60 mL/min   GFR calc Af Amer >60 >60 mL/min    Comment: (NOTE) The eGFR has been calculated using the CKD EPI equation. This calculation has not been validated in all clinical situations. eGFR's persistently <60 mL/min signify possible  Chronic Kidney Disease.   CBC WITH DIFFERENTIAL     Status: None   Collection Time: 10/28/15  7:18 AM  Result Value Ref Range   WBC 5.8 4.0 - 10.5 K/uL   RBC 5.79 4.22 - 5.81 MIL/uL   Hemoglobin 17.0 13.0 - 17.0 g/dL   HCT 49.2 39.0 - 52.0 %   MCV 85.0 78.0 - 100.0 fL   MCH 29.4 26.0 - 34.0 pg   MCHC 34.6 30.0 - 36.0 g/dL   RDW 13.3 11.5 - 15.5 %   Platelets 188 150 - 400 K/uL   Neutrophils Relative % 63 %   Neutro Abs 3.6 1.7 - 7.7 K/uL   Lymphocytes Relative 24 %   Lymphs Abs 1.4 0.7 - 4.0 K/uL   Monocytes Relative 7 %   Monocytes Absolute 0.4 0.1 - 1.0 K/uL   Eosinophils Relative 6 %   Eosinophils Absolute 0.4 0.0 - 0.7 K/uL   Basophils Relative 0 %   Basophils Absolute 0.0 0.0 - 0.1 K/uL  Comprehensive metabolic panel     Status: Abnormal   Collection Time: 10/28/15  7:18 AM  Result Value Ref Range   Sodium 139 135 - 145 mmol/L   Potassium 4.8 3.5 - 5.1 mmol/L   Chloride 105 101 - 111 mmol/L   CO2 27 22 - 32 mmol/L   Glucose, Bld 108 (H) 65 - 99 mg/dL   BUN 19 6 -  20 mg/dL   Creatinine, Ser 1.15 0.61 - 1.24 mg/dL   Calcium 9.6 8.9 - 10.3 mg/dL   Total Protein 7.3 6.5 - 8.1 g/dL   Albumin 4.1 3.5 - 5.0 g/dL   AST 30 15 - 41 U/L   ALT 50 17 - 63 U/L   Alkaline Phosphatase 54 38 - 126 U/L   Total Bilirubin 1.1 0.3 - 1.2 mg/dL   GFR calc non Af Amer >60 >60 mL/min   GFR calc Af Amer >60 >60 mL/min    Comment: (NOTE) The eGFR has been calculated using the CKD EPI equation. This calculation has not been validated in all clinical situations. eGFR's persistently <60 mL/min signify possible Chronic Kidney Disease.    Anion gap 7 5 - 15     HEENT: normal Cardio: RRR and no murmur Resp: CTA B/L and unlabored GI: BS positive and NT, ND Extremity:  Pulses positive and No Edema Skin:   Intact Neuro: Alert/Oriented, Normal Sensory and Abnormal Motor 0/5 in RUE, 3- R HF, KE, 0/5 R ankle DF/PF Musc/Skel:  Other no pain with UE or LE ROM Gen  NAD   Assessment/Plan: 1. 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 Physiatrist is providing close team supervision and 24 hour management of active medical problems listed below. Physiatrist and rehab team continue to assess barriers to discharge/monitor patient progress toward functional and medical goals. FIM:       Function - Toileting Toileting activity did not occur: Safety/medical concerns  Function Midwife transfer activity did not occur: Safety/medical concerns        Function - Comprehension Comprehension: Auditory Comprehension assist level: Follows basic conversation/direction with no assist  Function - Expression Expression: Verbal Expression assist level: Expresses basic needs/ideas: With no assist  Function - Social Interaction Social Interaction assist level: Interacts appropriately 90% of the time - Needs monitoring or encouragement for participation or interaction.  Function - Problem Solving Problem solving assist level: Solves basic problems with no assist  Function - Memory Memory assist level: More than reasonable amount of time Patient normally able to recall (first 3 days only): Current season, That he or she is in a hospital, Staff names and faces  Medical Problem List and Plan: 1.  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  DVT Prophylaxis/Anticoagulation: Subcutaneous Lovenox. Monitor for any bleeding episodes 3. Pain Management: Tylenol as needed 4. Hyperlipidemia. Lipitor 5. Neuropsych: This patient is capable of making decisions on his own behalf. 6. Skin/Wound Care: Routine skin checks 7. Fluids/Electrolytes/Nutrition: Routine I&O's with follow-up chemistries 8. HTN: Monitor 9. Prediabetes: Monitor   LOS (Days) 1 A  FACE TO FACE EVALUATION WAS PERFORMED  KIRSTEINS,ANDREW E 10/28/2015, 8:59 AM

## 2015-10-28 NOTE — Progress Notes (Signed)
Social Work  Social Work Assessment and Plan  Patient Details  Name: Brad Tran MRN: [id number] Date of Birth: 07/11/1965  Today's Date: 10/28/2015  Problem List:  Patient Active Problem List   Diagnosis Date Noted  . 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 Prediabetes   . Stroke (HCC) 10/25/2015  . CVA (cerebral infarction) 10/25/2015  . Stroke (cerebrum) (HCC) 10/25/2015  . Essential hypertension 10/25/2015  . Hyperlipidemia 10/25/2015  . Acute ischemic stroke (HCC)   . Cerebral infarction due to unspecified mechanism   . Dysarthria    Past Medical History: No past medical history on file. Past Surgical History: No past surgical history on file. Social History:  reports that he has never smoked. He has never used smokeless tobacco. He reports that he drinks alcohol. He reports that he does not use illicit drugs.  Family / Support Systems Marital Status: Divorced Patient Roles: Parent, Other (Comment) (Employee) Children: April-daughter [phone] here from [place] plans to stay shor time but then needs to go back to get her things Other Supports: Peov-brother  [phone] Anticipated Caregiver: April for a short time  Ability/Limitations of Caregiver: Daughter will plan to stay here a short time then return home to [place]. Pt's roommate works nights and brothers work also Engineer, structural Availability: 24/7 (for short time) Family Dynamics: Close knit with children-tow sons' and two daughter's, all are spread out but will come and see him. He has brothers who are supportive but live out of town. Pt will need 24 hr supervision at discharge from rehab.  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 Due to limited English will make sure daughter or son is here all speak fluent English   Abuse/Neglect Physical Abuse: Denies Verbal Abuse: Denies Sexual Abuse: Denies Exploitation of patient/patient's resources: Denies Self-Neglect: Denies  Emotional Status Pt's affect, behavior adn adjustment status: Pt is willing to do whatever he can to regain his independence. He is impulsive and a safety risk at this time due to his deficits. He is willing to work hard in therapies and is confident he will be independent again, always has been. Recent Psychosocial Issues: untreated HTN and will need MD follow up at discharge. Pyschiatric History: No history at this time deferred depression screen due to daughter feels he is doing ok, he is very independent according to her. He may benefit from neuro-psych follow due to young age and for coping Substance Abuse History: No issues  Patient / Family Perceptions, Expectations & Goals Pt/Family understanding of illness & functional limitations: Pt and daughter have a basic understanding of his stroke and deficits. Pt feels it will get better in time and heal like a broken bone. Daughter seems to understand more regarding the cause and treatment. Both have spoken with the MD and feel have a basic understanding. Premorbid pt/family roles/activities: Father, Employee, Sibling, friend, etc Anticipated changes in roles/activities/participation: resume Pt/family expectations/goals: Pt states: " I will get better you watch me."  Daughter states: " We will do what he needs but he is hard headed."  [employer]: None Premorbid Home Care/DME Agencies: None Transportation available at discharge: family and friends  Resource referrals recommended: Neuropsychology, Support group  (specify)  Discharge Planning Living Arrangements: Non-relatives/Friends Support Systems: Children, Other relatives, Friends/neighbors Type of Residence: Private residence Insurance Resources: Self-pay (Applying for [employer]) Financial Resources: Employment Financial Screen Referred: Yes Living Expenses: Rent Money Management: Patient Does the patient have any problems obtaining your medications?: Yes (Describe) (no insurance and was seeing a MD prior to admission) Home Management: Patient and roommate Patient/Family Preliminary Plans: Return to apartment with daughter for a shor time, [place]. Hopeful he will only need someone with him for a short time. Will see how pt does and work on a safe discharge plan. 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 He wants to be independent and care for himself, his main goal is to return to work. Daughter here form [place] to assist with his discharge needs and will stay with Him a short time. Aware he will need someone with him at discharge. Will contact financial Terri Piedracounselor-Denise Jones to begin the process of disability and medicaid if eligible. She is to contact daughter since pt's main focus is to go  Back to work. Will work on a safe discharge plan. Will have neuro-psych see due to young age for coping.  Lucy Chrisupree, Genni Buske G 10/28/2015, 3:39 PM

## 2015-10-28 NOTE — Care Management Note (Signed)
[facility] Individual Statement of Services  Patient Name:  Brad BrooksChhoun Tran  Date:  10/28/2015  Welcome to the [facility].  Our goal is to provide you with an individualized program based on your diagnosis and situation, designed to meet your specific needs.  With this comprehensive rehabilitation program, you will be expected to participate in at least 3 hours of rehabilitation therapies Monday-Friday, with modified therapy programming on the weekends.  Your rehabilitation program will include the following services:  Physical Therapy (PT), Occupational Therapy (OT), Speech Therapy (ST), 24 hour per day rehabilitation nursing, Therapeutic Recreaction (TR), Neuropsychology, Case Management (Social Worker), Rehabilitation Medicine, Nutrition Services and Pharmacy Services  Weekly team conferences will be held on Wednesday to discuss your progress.  Your Social Worker will talk with you frequently to get your input and to update you on team discussions.  Team conferences with you and your family in attendance may also be held.  Expected length of stay: 2-2.5 weeks   Overall anticipated outcome: supervision set up  Depending on your progress and recovery, your program may change. Your Social Worker will coordinate services and will keep you informed of any changes. Your Social Worker's name and contact numbers are listed  below.  The following services may also be recommended but are not provided by the [facility]:   Driving Evaluations  Home Health Rehabiltiation Services  Outpatient Rehabilitation Services  Vocational Rehabilitation   Arrangements will be made to provide these services after discharge if needed.  Arrangements include referral to agencies that provide these services.  Your insurance has been verified to be:  Pending Medicaid has not applied yet Your primary doctor is:  None  Pertinent information will be shared  with your doctor and your insurance company.  Social Worker:  Dossie DerBecky Aribelle Mccosh, SW [phone] or [phone]) [phone]  Information discussed with and copy given to patient by: Lucy Chrisupree, Staria Birkhead G, 10/28/2015, 3:21 PM

## 2015-10-28 NOTE — Progress Notes (Signed)
Trish Mage, RN Rehab Admission Coordinator Signed Physical Medicine and Rehabilitation PMR Pre-admission 10/27/2015 1:55 PM  Related encounter: ED to Hosp-Admission (Discharged) from 10/25/2015 in [facility] [place] [facility]    Expand All Collapse All   PMR Admission Coordinator Pre-Admission Assessment  Patient: Brad Tran is an 50 y.o., male MRN: [id number] DOB: 1965-07-26 Height: 5\' 3"  (160 cm) Weight: 79.742 kg (175 lb 12.8 oz)  Insurance Information Self pay - no insurance  Medicaid Application Date: Case Manager:  Disability Application Date: Case Worker:   Emergency Conservator, museum/gallery Information    Name Relation Home Work Mobile   Fischl,Peov Brother [phone]     Bickert,April Daughter   [phone]     Current Medical History  Patient Admitting Diagnosis: L BG/CR infarct  History of Present Illness: A 50 y.o. right handed limited English-speaking male with unremarkable past medical history on no scheduled medications at time of admission. Per chart review patient lives with family. Independent prior to admission. Family works during the day. One level home. Presented 10/25/2015 with right-sided weakness and slurred speech. Urine drug screen negative. Cranial CT scan with no acute changes. 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 MRI of the brain showed acute left basal ganglia nonhemorrhagic infarct. MRA with no emergent large vessel occlusion or stenosis. Carotid Dopplers had no ICA stenosis. Echocardiogram is pending. Patient did not receive TPA. Neurology follow-up currently on aspirin for CVA prophylaxis. Subcutaneous Lovenox for DVT prophylaxis. Tolerating a regular diet. Physical therapy evaluation completed with recommendations of physical medicine  rehabilitation consult .Patient to be admitted for comprehensive inpatient rehabilitation program.    Total: 6=NIH  Past Medical History  History reviewed. No pertinent past medical history.  Family History  family history includes Heart attack in his father; Stroke in his father.  Prior Rehab/Hospitalizations: No previous rehab admissions  Has the patient had major surgery during 100 days prior to admission? No  Current Medications   Current facility-administered medications:  . aspirin suppository 300 mg, 300 mg, Rectal, Daily **OR** aspirin tablet 325 mg, 325 mg, Oral, Daily, Alberteen Sam, MD, 325 mg at 10/27/15 0821 . atorvastatin (LIPITOR) tablet 80 mg, 80 mg, Oral, q1800, Kinnie Scales Rinehuls, PA-C, 80 mg at 10/26/15 1823 . enoxaparin (LOVENOX) injection 40 mg, 40 mg, Subcutaneous, Q24H, Alberteen Sam, MD, 40 mg at 10/27/15 6578 . labetalol (NORMODYNE,TRANDATE) injection 10 mg, 10 mg, Intravenous, Q6H PRN, Alberteen Sam, MD . senna-docusate (Senokot-S) tablet 1 tablet, 1 tablet, Oral, QHS PRN, Alberteen Sam, MD  Patients Current Diet: Diet Heart Room service appropriate?: Yes; Fluid consistency:: Thin Diet - low sodium heart healthy  Precautions / Restrictions Precautions Precautions: Fall Precaution Comments: bed/chair alarm Restrictions Weight Bearing Restrictions: No Other Position/Activity Restrictions: please prop R arm on pillow   Has the patient had 2 or more falls or a fall with injury in the past year?No  Prior Activity Level Community (5-7x/wk): Went out daily. worked FT nights, was driving.  Home Assistive Devices / Equipment Home Equipment: None  Prior Device Use: Indicate devices/aids used by the patient prior to current illness, exacerbation or injury? None  Prior Functional Level Prior Function Level of Independence: Independent Comments: per pt, was driving  Self Care: Did the patient need help bathing,  dressing, using the toilet or eating? Independent  Indoor Mobility: Did the patient need assistance with walking from room to room (with or without device)? Independent  Stairs: Did the patient need assistance  with internal or external stairs (with or without device)? Independent  Functional Cognition: Did the patient need help planning regular tasks such as shopping or remembering to take medications? Independent  Current Functional Level Cognition  Arousal/Alertness: Awake/alert Overall Cognitive Status: No family/caregiver present to determine baseline cognitive functioning Difficult to assess due to: Level of arousal Orientation Level: Oriented X4 Attention: Sustained Sustained Attention: Appears intact Memory: Impaired Memory Impairment: Retrieval deficit, Storage deficit, Decreased short term memory, Decreased recall of new information Decreased Short Term Memory: Verbal basic Awareness: Appears intact Problem Solving: Appears intact Safety/Judgment: Appears intact   Extremity Assessment (includes Sensation/Coordination)  Upper Extremity Assessment: RUE deficits/detail RUE Deficits / Details: R shoulder shrug present, shoulder flexion abscent, shoulder extension 1 out 5, elbow flexion abscent, elbow extension 1 out 5, no supination/ no pronation, no grasp RUE Coordination: decreased fine motor, decreased gross motor (reports normal sensation)  Lower Extremity Assessment: Defer to PT evaluation RLE Deficits / Details: generally 3-/5 throughout, able to flex/extend knee nearly full ROM vs gravity; able to PF/DF ankle and Great toe partial ROM; slow to initiate RLE Coordination: decreased gross motor    ADLs  Overall ADL's : Needs assistance/impaired Eating/Feeding: Set up, Sitting Eating/Feeding Details (indicate cue type and reason): pt reports "i can get it" without awareness to inability to cut food Grooming: Wash/dry hands, Wash/dry face, Oral care, Minimal  assistance, Standing Grooming Details (indicate cue type and reason): Pt washing R hand showing awareness to R side without cues. pt washing face and needed cues to use wash cloth. Question if this could be personal choice vs awareness. pt needed cues to sequence oral care. pt attempting to use R hand with lack of awareness to R hand deficits during task. Pt once initiating task needed (A) due to R hand flaccid.  Lower Body Bathing: Moderate assistance, Sit to/from stand Upper Body Dressing : Moderate assistance, Sitting Upper Body Dressing Details (indicate cue type and reason): educated on threading R UE first Lower Body Dressing: Moderate assistance, Bed level Lower Body Dressing Details (indicate cue type and reason): don socks. Able to cross R LE over L LE this session with min (A). pt needed (A) to thread toes into sock but able to use L hand to don socks Toilet Transfer: Minimal assistance Toilet Transfer Details (indicate cue type and reason): simulated EOB to chair Functional mobility during ADLs: Moderate assistance General ADL Comments: Pt don socks supine in bed with (A). pt able to complete bed mobility with min guard this session. pt transfered from EOB to sink level for grooming. pt needed mod cues to sequence transfer. pt noted to have R foot supination and adduction with transfer. pt needed cues to widen BOS.     Mobility  Overal bed mobility: Needs Assistance Bed Mobility: Supine to Sit Rolling: Mod assist Supine to sit: Min guard Sit to supine: Min assist General bed mobility comments: heavy use of L bed rail and L UE. pt allowing R UE to remain abducted to the body with attempt. Pt needed v/c at EOB to locate R ue to reposition to prevent injury. pt educated on R UE placement prior to mobility.     Transfers  Overall transfer level: Needs assistance Equipment used: (1 person assist) Transfers: Sit to/from Stand Sit to Stand: Min assist Stand pivot transfers: Max  assist General transfer comment: pt able to power up with bil LE without UE use. pt does reach for environmental supports ( chair arm rest) once in  standing for balance. Pt encouraged to avoid doing this     Ambulation / Gait / Stairs / Wheelchair Mobility  Ambulation/Gait Ambulation/Gait assistance: Mod assist, +2 physical assistance, Max assist Ambulation Distance (Feet): 15 Feet (x2) Assistive device: (1 person 3 musketeer, 2 person) Gait Pattern/deviations: Step-to pattern General Gait Details: 1 person in front to assist with weight shift to the L to aide in advancement of R LE. 1 person on R LE providing manual cueing at hip flexor and hamstring to step and at foot for placement to prevent supination. 2nd person to then also provide posterior knee support to prevent hyperextension in stance phase. 2 bouts of 15' with seated rest break in bewteen Gait velocity: slow Gait velocity interpretation: Below normal speed for age/gender    Posture / Balance Dynamic Sitting Balance Sitting balance - Comments: R UE extended with lack of awareness to positioning R UE Balance Overall balance assessment: Needs assistance Sitting-balance support: Single extremity supported, Feet supported Sitting balance-Leahy Scale: Fair Sitting balance - Comments: R UE extended with lack of awareness to positioning R UE Standing balance support: No upper extremity supported, During functional activity Standing balance-Leahy Scale: Fair Standing balance comment: R Ue placed on counter for support and help with balance. pt with x1 LOB at sink due to R knee weakness    Special needs/care consideration BiPAP/CPAP No CPM No Continuous Drip IV No Dialysis No  Life Vest No Oxygen No Special Bed No Trach Size No Wound Vac (area) No  Skin NO  Bowel mgmt: Last BM 10/25/15 Bladder mgmt: Voiding in urinal Diabetic mgmt: No    Previous Home Environment Living  Arrangements: Other relatives (roommate) Lives With: Spouse Available Help at Discharge: Family, Friend(s), Available PRN/intermittently Type of Home: House Home Layout: One level Home Access: Level entry Bathroom Shower/Tub: Tub/shower unit Additional Comments: son daughter and brother in from [place]. brother with wife and child visiting from one hour away and has a third brother that lives 30 minutes away. Pt lives with roommate  Discharge Living Setting Plans for Discharge Living Setting: House, Lives with (comment) (Lives with roommate.) Type of Home at Discharge: House Discharge Home Layout: One level Discharge Home Access: Stairs to enter Entrance Stairs-Number of Steps: 3  Social/Family/Support Systems Patient Roles: Parent, Other (Comment) (Has 2 daughters, 2 sons, and brothers.) Contact Information: Oaklyn Mans - brother - [phone] Anticipated Caregiver: April Seth - daughter Anticipated Caregiver's Contact Information: April - dtr - [phone] Ability/Limitations of Caregiver: Daughter plans home with patient to assist after rehab discharge. Has a roommate who works nights. Caregiver Availability: 24/7 Discharge Plan Discussed with Primary Caregiver: Yes Is Caregiver In Agreement with Plan?: Yes Does Caregiver/Family have Issues with Lodging/Transportation while Pt is in Rehab?: No  Goals/Additional Needs Patient/Family Goal for Rehab: PT/OT supervision to min assist goals Expected length of stay: 7 days Cultural Considerations: Speaks [place] and [place]. Has been in the [place] for 30 years. Dietary Needs: Heart diet, thin liquids Equipment Needs: TBD Pt/Family Agrees to Admission and willing to participate: Yes Program Orientation Provided & Reviewed with Pt/Caregiver Including Roles & Responsibilities: Yes Additional Information Needs: Patient is worried about the cost of rehab. He has no insurance. He wants to stay a week and then plans for daughter to  assist at home after discharge.  Decrease burden of Care through IP rehab admission: N/A  Possible need for SNF placement upon discharge: Not anticipated  Patient Condition: This patient's condition remains as documented in the consult dated  10/26/15, in which the Rehabilitation Physician determined and documented that the patient's condition is appropriate for intensive rehabilitative care in an inpatient rehabilitation facility pending caregiver support. These areas have been addressed. Patient's daughter plans to stay with him and assist after discharge from rehab. Will admit to inpatient rehab today.  Preadmission Screen Completed By: Trish Mage, 10/27/2015 2:10 PM ______________________________________________________________________  Discussed status with Dr. Allena Katz on 10/27/15 at 1405 and received telephone approval for admission today.  Admission Coordinator: Trish Mage, time1405/Date07/11/17          Cosigned by: Ankit Karis Juba, MD at 10/27/2015 2:19 PM  Revision History     Date/Time User Provider Type Action   10/27/2015 2:19 PM Ankit Karis Juba, MD Physician Cosign   10/27/2015 2:10 PM Trish Mage, RN Rehab Admission Coordinator Sign   10/27/2015 2:06 PM Trish Mage, RN Rehab Admission Coordinator Sign   View Details Report

## 2015-10-28 NOTE — Plan of Care (Signed)
Problem: RH Balance Goal: LTG Patient will maintain dynamic standing balance (PT) LTG: Patient will maintain dynamic standing balance with assistance during mobility activities (PT) With LRAD  Problem: RH Bed Mobility Goal: LTG Patient will perform bed mobility with assist (PT) LTG: Patient will perform bed mobility with assistance, with/without cues (PT). Without hospital bed features  Problem: RH Bed to Chair Transfers Goal: LTG Patient will perform bed/chair transfers w/assist (PT) LTG: Patient will perform bed/chair transfers with assistance, with/without cues (PT). With LRAD  Problem: RH Car Transfers Goal: LTG Patient will perform car transfers with assist (PT) LTG: Patient will perform car transfers with assistance (PT). With LRAD  Problem: RH Furniture Transfers Goal: LTG Patient will perform furniture transfers w/assist (OT/PT LTG: Patient will perform furniture transfers with assistance (OT/PT). With LRAD  Problem: RH Ambulation Goal: LTG Patient will ambulate in controlled environment (PT) LTG: Patient will ambulate in a controlled environment, # of feet with assistance (PT). 150 ft with LRAD Goal: LTG Patient will ambulate in home environment (PT) LTG: Patient will ambulate in home environment, # of feet with assistance (PT). 50 ft with LRAD  Problem: RH Wheelchair Mobility Goal: LTG Patient will propel w/c in controlled environment (PT) LTG: Patient will propel wheelchair in controlled environment, # of feet with assist (PT) 150 ft Goal: LTG Patient will propel w/c in home environment (PT) LTG: Patient will propel wheelchair in home environment, # of feet with assistance (PT). 50 ft  Problem: RH Stairs Goal: LTG Patient will ambulate up and down stairs w/assist (PT) LTG: Patient will ambulate up and down # of stairs with assistance (PT) 3 steps without rails with LRAD

## 2015-10-28 NOTE — Evaluation (Signed)
Physical Therapy Assessment and Plan  Patient Details  Name: Brad Tran MRN: [id number] Date of Birth: 1966-04-06  PT Diagnosis: Abnormality of gait, Coordination disorder, Difficulty walking, Hemiplegia dominant and Muscle weakness Rehab Potential: Good ELOS: 16-19 days   Today's Date: 10/28/2015 PT Individual Time: 1696-7893 and 8101-7510 PT Individual Time Calculation (min): 72 min and 55 min     Problem List:  Patient Active Problem List   Diagnosis Date Noted  . Cerebral infarction due to thrombosis of left middle cerebral artery ([place])   . Prediabetes   . Stroke ([place]) 10/25/2015  . CVA (cerebral infarction) 10/25/2015  . Stroke (cerebrum) ([place]) 10/25/2015  . Essential hypertension 10/25/2015  . Hyperlipidemia 10/25/2015  . Acute ischemic stroke ([place])   . Cerebral infarction due to unspecified mechanism   . Dysarthria     Past Medical History: No past medical history on file. Past Surgical History: No past surgical history on file.  Assessment & Plan Clinical Impression: Patient is a 50 y.o. year old male, right handed limited English-speaking male with unremarkable past medical history on no scheduled medications at time of admission. Per chart review patient lives with family. Independent prior to admission. Family works during the day. One level home. Presented 10/25/2015 with right-sided weakness and slurred speech. Urine drug screen negative. Cranial CT scan with no acute changes. 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 MRI of the brain showed acute left basal ganglia nonhemorrhagic infarct. MRA with no emergent large vessel occlusion or stenosis. Carotid Dopplers had no ICA stenosis. Echocardiogram is pending. Patient did not receive TPA. Neurology follow-up currently on aspirin for CVA prophylaxis. Subcutaneous Lovenox for DVT prophylaxis. Tolerating a regular diet. Physical therapy evaluation completed with recommendations of physical medicine rehabilitation  consult.Patient was admitted for comprehensive rehabilitation program.  Patient transferred to CIR on 10/27/2015 .   Patient currently requires mod with mobility secondary to muscle weakness and muscle paralysis, decreased cardiorespiratoy endurance, impaired timing and sequencing, decreased coordination and decreased motor planning,  and decreased sitting balance, decreased standing balance, hemiplegia and decreased balance strategies.  Prior to hospitalization, patient was independent  with mobility and lived with Other (Comment) (lives with 2 roommates) in a House home.  Home access is 3Stairs to enter.  Patient will benefit from skilled PT intervention to maximize safe functional mobility, minimize fall risk and decrease caregiver burden for planned discharge home with 24 hour supervision.  Anticipate patient will benefit from follow up [place] at discharge.  PT - End of Session Activity Tolerance: Tolerates 30+ min activity with multiple rests Endurance Deficit: Yes Endurance Deficit Description: 2/2 decreased cardiovascular endurance PT Assessment Rehab Potential (ACUTE/IP ONLY): Good PT Patient demonstrates impairments in the following area(s): Balance;Behavior;Endurance;Motor;Sensory;Perception;Safety PT Transfers Functional Problem(s): Bed Mobility;Car;Bed to Chair;Furniture PT Locomotion Functional Problem(s): Ambulation;Wheelchair Mobility;Stairs PT Plan PT Intensity: Minimum of 1-2 x/day ,45 to 90 minutes PT Frequency: 5 out of 7 days PT Duration Estimated Length of Stay: 16-19 days PT Treatment/Interventions: Ambulation/gait training;Discharge planning;Psychosocial support;Functional mobility training;Therapeutic Activities;Wheelchair propulsion/positioning;Therapeutic Exercise;Neuromuscular re-education;Disease management/prevention;Balance/vestibular training;Cognitive remediation/compensation;DME/adaptive equipment instruction;Pain management;Splinting/orthotics;UE/LE Strength  taining/ROM;UE/LE Coordination activities;Stair training;Patient/family education;Community reintegration;Functional electrical stimulation PT Transfers Anticipated Outcome(s): supervision PT Locomotion Anticipated Outcome(s): supervision PT Recommendation Recommendations for Other Services: Speech consult Follow Up Recommendations: Home health PT;24 hour supervision/assistance Patient destination: Home (with daughter) Equipment Recommended: 3 in 1 bedside comode;To be determined (w/c vs RW TBD)  Skilled Therapeutic Intervention Treatment 1: Pt received in w/c with daughter & son present & agreeable to PT; pt denied  c/o pain. PT evaluation initiated & skilled PT treatment followed. Educated pt on CIR schedule, therapy treatment, and safety plan. Pt performed car transfers, stand pivot, stair negotiation and gait with rail in hallway with Mod A overall. Pt educated on squat pivot transfers but unable to sequence even with maximum demonstration & cuing and instead performed a stand pivot without AD and had difficulty stepping to turn to destination. Pt is very impulsive with all movement even with maximum cuing & education for safety with tasks. Pt has impaired awareness as he will attempt to move as if R UE & LE are actively moving with him. Pt with significantly impaired standing balance 2/2 R hemiplegia. At end of session pt left in w/c with lap tray in place & NA educated on need for pt to have QRB belt; pt left with all needs within reach.   Treatment 2: Pt received in bed with friend present for session & agreeable to PT; pt denied c/o pain. PT provided total A for donning socks & pt transferred supine>sit with min A & use of bed features. Pt educated on squat pivot transfers bed>w/c but pt transferred to upright standing thus requiring mod A for transfer to shift weight and pivot. Session focused on w/c mobility with L hemi technique x 100 ft + 100 ft + 100 ft  with supervision in controlled  environment. Utilized cybex kinetron in sitting with very close supervision for sitting balance as pt would continue to slide forward. Pt performed task on 20 cm/sec x 5 minutes, with RLE activation & noting fatigue afterwards. Utilized dynavision from w/c level for R attention & pt with 0.8-0.9 sec reaction time to L quadrant with 1.12 sec reaction time to lower R quadrant using LUE. Gait training in hallway with rail x 30 ft + 30 ft with mod A. Pt able to activate RLE quads to advance RLE but requires approximation at R ankle to prevent significant supination/rolling and assistance with placement of foot; 2nd gait trial completed with shoes donned. PT educated pt on proper hand placement for transfers & sequencing for increased safety, as well as management of w/c parts. At end of session pt left sitting in w/c with QRB & lap tray donned, all needs within reach & family present.  During session educated pt on need to wait on therapist to perform activity as pt continues to be impulsive with movements and forgets about R hemiplegia.   PT Evaluation Precautions/Restrictions Precautions Precautions: Fall Precaution Comments: R hemi Restrictions Weight Bearing Restrictions: No  General Chart Reviewed: Yes Response to Previous Treatment: Patient with no complaints from previous session. Family/Caregiver Present: Yes (son & daughter)   Pain Pain Assessment Pain Assessment: No/denies pain  Home Living/Prior Functioning Home Living Available Help at Discharge: Family;Available 24 hours/day (daughter (just has to fly back to [place] to adjust her liviing situation out there & then can come back here to assist 24/7)) Type of Home: House Home Access: Stairs to enter [employer] of Steps: 3 Entrance Stairs-Rails: None Home Layout: One level Bathroom Shower/Tub: Tub/shower unit Additional Comments: son daughter and brother in from [place]. brother with wife and child visiting from one  hour away and has a third brother that lives 30 minutes away. Pt lives with roommate. Daughter April reported going to help out at d/c  Lives With: Other (Comment) (lives with 2 roommates) Prior Function Level of Independence: Independent with basic ADLs;Independent with gait;Independent with homemaking with ambulation;Independent with transfers  Able to  Take Stairs?: Reciprically Driving: Yes Vocation: Full time employment  Vision/Perception  Pt wears glasses at all times at baseline & without them vision is blurry (pt does not have glasses with him in hospital but daughter is going to try to find them & bring them in). Pt reports no changes in vision following CVA.   Cognition Overall Cognitive Status: Impaired/Different from baseline Arousal/Alertness: Awake/alert Orientation Level: Oriented to person;Oriented to place;Oriented to time;Oriented to situation Attention: Sustained Sustained Attention: Appears intact Memory: Impaired Memory Impairment: Retrieval deficit;Storage deficit;Decreased short term memory;Decreased recall of new information Decreased Short Term Memory: Verbal basic Awareness: Impaired Awareness Impairment: Intellectual impairment Problem Solving: Impaired Problem Solving Impairment: Functional basic Behaviors: Impulsive Safety/Judgment: Impaired  Sensation Sensation Light Touch: Appears Intact (BLE) Proprioception: Appears Intact (BLE) Coordination Gross Motor Movements are Fluid and Coordinated: No Fine Motor Movements are Fluid and Coordinated: No  Motor  Motor Motor: Hemiplegia;Motor apraxia;Abnormal postural alignment and control Motor - Skilled Clinical Observations:  (significant weakness RLE & RUE)   Mobility Bed Mobility Bed Mobility: Rolling Right;Rolling Left;Supine to Sit;Sit to Supine (apartment bed; very poor safety awareness of EOB) Rolling Right: 4: Min assist Rolling Left: 4: Min assist Supine to Sit: 4: Min assist Sit to Supine:  4: Min assist Transfers Transfers: Yes Sit to Stand: 4: Min assist Stand to Sit: 4: Min assist Stand Pivot Transfers: 3: Mod assist   Locomotion  Ambulation Ambulation: Yes Ambulation/Gait Assistance: 3: Mod assist;1: +2 Total assist (mod A + w/c follow for safety ) Ambulation Distance (Feet): 30 Feet Assistive device:  (rail in halllway) Ambulation/Gait Assistance Details: Verbal cues for sequencing Gait Gait: Yes Gait Pattern: Decreased step length - left;Decreased step length - right;Decreased weight shift to left;Decreased weight shift to right (R ankle rolling/supination) Stairs / Additional Locomotion Stairs: Yes Stairs Assistance: 3: Mod assist Stair Management Technique: One rail Left Number of Stairs: 12 Height of Stairs:  (3 in + 6 in) Product manager Mobility: Yes Wheelchair Assistance: 4: Min guard;5: Supervision (controlled environment) Wheelchair Propulsion: Left upper extremity;Left lower extremity Wheelchair Parts Management: Needs assistance Distance: 100 ft   Extremity Assessment  RLE Assessment RLE Assessment: Exceptions to WFL (PROM WFL) RLE Strength Right Hip Flexion: 1/5 Right Knee Flexion: 0/5 Right Knee Extension: 0/5 Right Ankle Dorsiflexion: 0/5 LLE Assessment LLE Assessment: Exceptions to WFL LLE AROM (degrees) Overall AROM Left Lower Extremity: Within functional limits for tasks assessed LLE Strength Left Hip Flexion: 4+/5 Left Knee Flexion: 5/5 Left Knee Extension: 5/5 Left Ankle Dorsiflexion: 5/5   See Function Navigator for Current Functional Status.   Refer to Care Plan for Long Term Goals  Recommendations for other services: Other: Speech Therapy  Discharge Criteria: Patient will be discharged from PT if patient refuses treatment 3 consecutive times without medical reason, if treatment goals not met, if there is a change in medical status, if patient makes no progress towards goals or if patient is discharged from  hospital.  The above assessment, treatment plan, treatment alternatives and goals were discussed and mutually agreed upon: by patient and by family  [place] 10/28/2015, 10:39 AM

## 2015-10-28 NOTE — Evaluation (Signed)
Occupational Therapy Assessment and Plan  Patient Details  Name: Brad Tran MRN: [id number] Date of Birth: [birth year]  OT Diagnosis: abnormal posture, apraxia and muscle weakness (generalized) Rehab Potential: Rehab Potential (ACUTE ONLY): Good ELOS: 16-20 days   Today's Date: 10/28/2015 OT Individual Time:  - 9:00-10:00 (78mn)       Problem List:  Patient Active Problem List   Diagnosis Date Noted  . Cerebral infarction due to [place] of left middle cerebral artery ([place]   . Prediabetes   . [place] ([place] 10/25/2015  . CVA (cerebral infarction) 10/25/2015  . St[place] ([place]rebrum) ([place] 10/25/2015  . Essential hypertension 10/25/2015  . Hyperlipidemia 10/25/2015  . Acute ischemic stroke ([place]   . Cerebral infarction due to unspecified mechanism   . Dysarthria     Past Medical History: No past medical history on file. Past Surgical History: No past surgical history on file.  Assessment & Plan Clinical Impression: Patient is a 50y.o. year old male right handed limited English-speaking male with unremarkable past medical history on no scheduled medications at time of admission. Per chart review patient lives with family. Independent prior to admission. Family works during the day. One level home. Presented 10/25/2015 with right-sided weakness and slurred speech. Urine drug screen negative. Cranial CT scan with no acute changes. 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 MRI of the brain showed acute left basal ganglia nonhemorrhagic infarct. MRA with no emergent large vessel occlusion or stenosis. Carotid Dopplers had no ICA stenosis. Echocardiogram is pending. Patient did not receive TPA. Neurology follow-up currently on aspirin for CVA prophylaxis. Subcutaneous Lovenox for DVT prophylaxis. Tolerating a regular diet.  Patient transferred to CIR on 10/27/2015 .    Patient currently requires mod with basic self-care skills and basic mobility  secondary to muscle weakness, decreased  cardiorespiratoy endurance, impaired timing and sequencing, unbalanced muscle activation, motor apraxia, decreased coordination and decreased motor planning, decreased attention, decreased awareness, decreased problem solving, decreased safety awareness and delayed processing and decreased standing balance, decreased postural control, hemiplegia and decreased balance strategies.  Prior to hospitalization, patient could complete ADL with independent .  Patient will benefit from skilled intervention to decrease level of assist with basic self-care skills and increase independence with basic self-care skills prior to discharge home with care partner.  Anticipate patient will require intermittent supervision and follow up outpatient.  OT - End of Session Activity Tolerance: Tolerates 30+ min activity with multiple rests Endurance Deficit: Yes OT Assessment Rehab Potential (ACUTE ONLY): Good Barriers to Discharge: Decreased caregiver support OT Patient demonstrates impairments in the following area(s): Balance;Safety;Behavior;Skin Integrity;Cognition;Edema;Endurance;Motor;Nutrition;Pain;Perception OT Basic ADL's Functional Problem(s): Grooming;Bathing;Dressing;Toileting;Eating OT Transfers Functional Problem(s): Toilet;Tub/Shower OT Additional Impairment(s): Fuctional Use of Upper Extremity OT Plan OT Intensity: Minimum of 1-2 x/day, 45 to 90 minutes OT Frequency: 5 out of 7 days OT Duration/Estimated Length of Stay: 16-20 days OT Treatment/Interventions: Balance/vestibular training;Cognitive remediation/compensation;Community reintegration;Discharge planning;Disease mangement/prevention;DME/adaptive equipment instruction;Neuromuscular re-education;Functional mobility training;Functional electrical stimulation;Pain management;Self Care/advanced ADL retraining;Therapeutic Activities;UE/LE Coordination activities;Visual/perceptual remediation/compensation;Therapeutic Exercise;Skin care/wound  managment;Patient/family education;Psychosocial support;Splinting/orthotics;UE/LE Strength taining/ROM;Wheelchair propulsion/positioning OT Self Feeding Anticipated Outcome(s): supervision OT Basic Self-Care Anticipated Outcome(s): supervision OT Toileting Anticipated Outcome(s): supervision OT Bathroom Transfers Anticipated Outcome(s): supervision OT Recommendation Recommendations for Other Services: Speech consult Patient destination: Home Follow Up Recommendations: Outpatient OT Equipment Recommended: To be determined   Skilled Therapeutic Intervention OT eval initiated  with OT purpose, role and goals discussed with pt and pt's daughter Brad Tran. SElf care retraining including toileting, showering, transfer training , standing balance and  controlled transitional movements. Pt able to perform stand step pivots with mod A with max Verbal and tactile Cues for sequence and right LE activation and to slow down. Pt impulsive with movements and difficulty slowing time and allowing time to active right side. Pt able to initiate movement in shoulder elevation and horizontal movement with gravity eliminated in functional tasks (washing, drying and donning deodorant). Education and demonstrated hemi dressing techniques. Standing balance with equal weight bearing through LEs using visual feedback with mirror and tactile cues. Given lap tray to increase visual attention to right UE and for safe placement.   Discussed with PT teammate about requesting SLP eval to further eval cognition for decr problem solving, safety awareness and impulsiveness.   OT Evaluation Precautions/Restrictions  Precautions Precautions: Fall Restrictions Weight Bearing Restrictions: No General Chart Reviewed: Yes Family/Caregiver Present: Yes (daughter Brad Tran) Vital Signs Therapy Vitals Pulse Rate: 79 BP: (!) 154/99 mmHg (after prn catapres) Patient Position (if appropriate): Lying Pain Pain Assessment Pain Assessment:  No/denies pain Home Living/Prior Functioning Home Living Available Help at Discharge: Family, Friend(s), Available PRN/intermittently Type of Home: House Home Access: Level entry Home Layout: One level Bathroom Shower/Tub: Tub/shower unit Additional Comments: son daughter and brother in from [place]. brother with wife and child visiting from one hour away and has a third brother that lives 30 minutes away. Pt lives with roommate. Daughter Brad Tran reported going to help out at d/c  Lives With: Spouse IADL History Type of Occupation: works at [employer] ADL ADL Comments: see functional navigator Vision/Perception  Vision- History Baseline Vision/History: Wears glasses (all the time but doesnt have them here) Wears Glasses: At all times Patient Visual Report: No change from baseline  Cognition Overall Cognitive Status: Impaired/Different from baseline Arousal/Alertness: Awake/alert Orientation Level: Person;Place;Situation Person: Oriented Place: Oriented Situation: Oriented Year: 2017 Month: July Day of Week:  (cheated and looked at calendar) Memory: Impaired Memory Impairment: Retrieval deficit;Storage deficit;Decreased short term memory;Decreased recall of new information Decreased Short Term Memory: Verbal basic Immediate Memory Recall: Sock;Blue;Bed Memory Recall: Sock;Blue;Bed Memory Recall Sock: With Cue Memory Recall Blue: With Cue Memory Recall Bed: With Cue Attention: Sustained Sustained Attention: Appears intact Awareness: Impaired Awareness Impairment: Intellectual impairment Problem Solving: Impaired Problem Solving Impairment: Functional basic Safety/Judgment: Impaired Sensation Sensation Light Touch: Impaired Detail Light Touch Impaired Details: Impaired RLE;Impaired RUE Stereognosis: Impaired Detail Stereognosis Impaired Details: Impaired RUE;Impaired LUE Proprioception: Impaired Detail Proprioception Impaired Details: Impaired RUE;Impaired  LUE Coordination Gross Motor Movements are Fluid and Coordinated: No Fine Motor Movements are Fluid and Coordinated: No Motor  Motor Motor: Hemiplegia;Motor apraxia;Abnormal postural alignment and control Mobility  Transfers Transfers: Sit to Stand;Stand to Sit Sit to Stand: 4: Min assist Stand to Sit: 4: Min assist  Trunk/Postural Assessment  Cervical Assessment Cervical Assessment: Within Functional Limits Thoracic Assessment Thoracic Assessment: Within Functional Limits Lumbar Assessment Lumbar Assessment:  (posterior pelvic tilt) Postural Control Postural Control: Deficits on evaluation Righting Reactions: delayed  Balance Balance Balance Assessed: Yes Static Standing Balance Static Standing - Balance Support: During functional activity Static Standing - Level of Assistance: 4: Min assist Dynamic Standing Balance Dynamic Standing - Balance Support: During functional activity Dynamic Standing - Level of Assistance: 4: Min assist;3: Mod assist Dynamic Standing - Balance Activities: Reaching for objects;Reaching across midline;Reaching for weighted objects Extremity/Trunk Assessment RUE Assessment RUE Assessment: Exceptions to [facility] RUE AROM (degrees) Overall AROM Right Upper Extremity: Within functional limits for tasks performed RUE Overall AROM Comments: [facility] RUE Strength RUE Overall Strength Comments:  Brunstrom I- II in UE and flaccid in hand  RUE Tone RUE Tone: Modified Ashworth Modified Ashworth Scale for Grading Hypertonia RUE: Slight increase in muscle tone, manifested by a catch and release or by minimal resistance at the end of the range of motion when the affected part(s) is moved in flexion or extension LUE Assessment LUE Assessment: Within Functional Limits   See Function Navigator for Current Functional Status.   Refer to Care Plan for Long Term Goals  Recommendations for other services: None  Discharge Criteria: Patient will be discharged from OT if  patient refuses treatment 3 consecutive times without medical reason, if treatment goals not met, if there is a change in medical status, if patient makes no progress towards goals or if patient is discharged from hospital.  The above assessment, treatment plan, treatment alternatives and goals were discussed and mutually agreed upon: by patient and by family  Nicoletta Ba 10/28/2015, 10:26 AM

## 2015-10-29 ENCOUNTER — Inpatient Hospital Stay (HOSPITAL_COMMUNITY): Payer: Self-pay | Admitting: Physical Therapy

## 2015-10-29 ENCOUNTER — Inpatient Hospital Stay (HOSPITAL_COMMUNITY): Payer: Self-pay | Admitting: Occupational Therapy

## 2015-10-29 ENCOUNTER — Inpatient Hospital Stay (HOSPITAL_COMMUNITY): Payer: Self-pay | Admitting: Speech Pathology

## 2015-10-29 DIAGNOSIS — G8191 Hemiplegia, unspecified affecting right dominant side: Secondary | ICD-10-CM

## 2015-10-29 NOTE — Progress Notes (Signed)
Occupational Therapy Session Note  Patient Details  Name: Brad Tran MRN: [id number] Date of Birth: 04/19/1965  Today's Date: 10/29/2015 OT Individual Time: [id number] OT Individual Time Calculation (min): 58 min    Short Term Goals: Week 1:  OT Short Term Goal 1 (Week 1): Pt will perform stand step transfers with minA with min cuing consistently to toilet/ BSC OT Short Term Goal 2 (Week 1): Pt will don shirt from folded position with mod A OT Short Term Goal 3 (Week 1): Pt will maintainind standing balance to complete tooth brushing with min A OT Short Term Goal 4 (Week 1): Pt will incorporate right UE in functional bathing / dressing task with min questioning cuing  Skilled Therapeutic Interventions/Progress Updates:    Treatment session with focus on self care skills, functional transfers, and NMR. Pt seated in recliner w/o quick release belt at start of session. Pt [facility] noted rolling of RLE. Verbal cues and manual facilitation for RLE placement throughout remainder of session to reinforce safety. Subsequent transfers between Min A and Mod A for safety.   Pt completed showering with Min Guard A for safety due to impulsivity. Pt observed to have improved response to visual cues paired with verbal cues given pt proclivity to move immediately following verbal cues alone. Dressing completed at Mod A with verbal and visual cues for hemi dressing techniques. Pt nodded to signal understanding of sequencing prior to demonstrating technique. Oral hygiene completed sit > stand at sink with Min Guard A for maintaining hip and knee extension and prevention of rolling R foot.   Pt completed NMR using UE ranger to move RUE in all planes of movement. Noted minor return of muscle activation in shoulders and pec. Increased and decreased height of UE Ranger for additional muscle incorporation and activation.   [facility] with quick release belt fastened and  all needs in reach.   Therapy Documentation Precautions:  Precautions Precautions: Fall Precaution Comments: R hemi Restrictions Weight Bearing Restrictions: No General:   Vital Signs: Therapy Vitals Pulse Rate: 70 BP: (!) 153/90 mmHg Pain: Pain Assessment Pain Assessment: No/denies pain Pain Score: 0-No pain ADL: ADL ADL Comments: see functional navigator  See Function Navigator for Current Functional Status.   Therapy/Group: Individual Therapy  Brad Tran 10/29/2015, 11:54 AM

## 2015-10-29 NOTE — Progress Notes (Signed)
Occupational Therapy Session Note  Patient Details  Name: Brad Tran MRN: [id number] Date of Birth: [birth year]  Today's Date: 10/29/2015 OT Individual Time: 1330-1400 OT Individual Time Calculation (min): 30 min    Short Term Goals: Week 1:  OT Short Term Goal 1 (Week 1): Pt will perform stand step transfers with minA with min cuing consistently to toilet/ BSC OT Short Term Goal 2 (Week 1): Pt will don shirt from folded position with mod A OT Short Term Goal 3 (Week 1): Pt will maintainind standing balance to complete tooth brushing with min A OT Short Term Goal 4 (Week 1): Pt will incorporate right UE in functional bathing / dressing task with min questioning cuing  Skilled Therapeutic Interventions/Progress Updates:    Pt seen for NMR for RUE with tapping, guided ROM with table top slides, UE Ranger, and supine guided motion focusing on sh flex/ext, elbow flex/ext. Pt has active scapular elevation and retraction and trace sh flexion.  Encouraged pt to work on scapular movements in room. Pt was somewhat impulsive with his transfers, needing cues to not stand without staff member with him. He also needs cues to fully attend to R leg with transfers to bring leg back into alignment. Pt taken back to room to transfer back to bed with all needs met.  Therapy Documentation Precautions:  Precautions Precautions: Fall Precaution Comments: R hemi Restrictions Weight Bearing Restrictions: No    Vital Signs:  Pain: Pain Assessment Pain Assessment: No/denies pain ADL: ADL ADL Comments: see functional navigator  See Function Navigator for Current Functional Status.   Therapy/Group: Individual Therapy  [place] 10/29/2015, 2:36 PM

## 2015-10-29 NOTE — Progress Notes (Signed)
Physical Therapy Session Note  Patient Details  Name: Brad BrooksChhoun Louthan MRN: [id number] Date of Birth: 10/29/1965  Today's Date: 10/29/2015 PT Individual Time: 1100-1200 PT Individual Time Calculation (min): 60 min   Short Term Goals: Week 1:  PT Short Term Goal 1 (Week 1): Pt will perform functional transfers with steady A.  PT Short Term Goal 2 (Week 1): Pt will ambulate 75 ft with LRAD & Min A. PT Short Term Goal 3 (Week 1): Pt will negotiate 12 steps with B rails & min A. PT Short Term Goal 4 (Week 1): Pt will demonstrate steady A standing balance with LRAD.  Skilled Therapeutic Interventions/Progress Updates:    Pt received seated in recliner with handoff from SLP; denies pain and agreeable to treatment. Squat pivot transfer w/c <>mat table/recliner x4 throughout session with min guard and cues for hand placement and RLE progression. Sit <>stand and LLE toe taps to 3" step with maxA initially due to decrease R knee control and LOB to R side, improved to min guard at knee with occasional minA for R anterolateral LOBs. Bilat lateral step ups to 3" and 6" stair with LUE support on rail for RLE strengthening and L weight shift for carryover into gait. Gait x25' with L rail and minA; occasional assist/cueing for RLE progression d/t short step length with poor LLE weight shift. Second gait trial with no rail and minA overall; note increase in impaired L weight shift for RLE progression with increase in LOB when progressing RLE. Standing alternating toe taps to numbered targets. Modified plantigrade position on elevated mat table with assist for RUE stabilization with dynamic alternating UE reaching to numbered targets and LE taps with crossing midline. Returned to room in w/c totalA; stand pivot transfer to bed with min guard. Remained supine in bed with alarm intact and all needs in reach.  Therapy Documentation Precautions:  Precautions Precautions: Fall Precaution Comments: R  hemi Restrictions Weight Bearing Restrictions: No Vital Signs: Therapy Vitals Pulse Rate: 70 BP: (!) 153/90 mmHg Pain: Pain Assessment Pain Assessment: No/denies pain Pain Score: 0-No pain   See Function Navigator for Current Functional Status.   Therapy/Group: Individual Therapy  [name] 10/29/2015, 12:12 PM

## 2015-10-29 NOTE — Evaluation (Signed)
Speech Language Pathology Assessment and Plan  Patient Details  Name: Brad Tran MRN: [id number] Date of Birth: 05/03/1965  SLP Diagnosis: Speech and Language deficits;Cognitive Impairments;Dysarthria  Rehab Potential: Good ELOS: 16-20 days     Today's Date: 10/29/2015 SLP Individual Time: 1000-1100 SLP Individual Time Calculation (min): 60 min   Problem List:  Patient Active Problem List   Diagnosis Date Noted  . Cerebral infarction due to thrombosis of left middle cerebral artery ([place])   . Prediabetes   . Stroke ([place]) 10/25/2015  . CVA (cerebral infarction) 10/25/2015  . Stroke (cerebrum) ([place]) 10/25/2015  . Essential hypertension 10/25/2015  . Hyperlipidemia 10/25/2015  . Acute ischemic stroke ([place])   . Cerebral infarction due to unspecified mechanism   . Dysarthria    Past Medical History: No past medical history on file. Past Surgical History: No past surgical history on file.  Assessment / Plan / Recommendation Clinical Impression  Brad Tran is a 50 y.o. right handed limited English-speaking male with unremarkable past medical history on no scheduled medications at time of admission. Presented 10/25/2015 with right-sided weakness and slurred speech. Cranial CT scan with no acute changes. 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 MRI of the brain showed acute left basal ganglia nonhemorrhagic infarct.  Patient was admitted for comprehensive rehabilitation program on 10/27/2015.  SLP evaluation was ordered due to concerns for cognitive impairment following PT/OT evaluations.  Evaluation completed on 10/29/2015 with the following results:  Pt presents with mild cognitive-linguistic impairment characterized by decreased recall of new information, decreased emergent awareness of impairments, and poor safety awareness.  Pt also presents with mild dysarthria due to right sided oral motor weakness.  Pt is intelligible in conversations but articulatory precision is impacted by the  abovementioned deficits and is reported to be bothersome by the.  Pt also endorses mild higher level word finding impairment in conversation.   Pt has experienced a loss in function s/p CVA and as a result would benefit from skilled ST while inpatient in order to maximize functional independence and reduce burden of care prior to discharge.  Anticipate that pt will need 24/7 supervision at discharge due to safety concerns and may benefit from ongoing ST at next level of care.    Skilled Therapeutic Interventions          Cognitive-linguistic evaluation completed with results and recommendations reviewed with patient. Therapist discussed compensatory intelligibility strategies emphasizing slow rate, overarticulation, loud voice, and pausing between words.  Handout was provided to maximize carryover in between therapy sessions.      SLP Assessment  Patient will need skilled [facility] during CIR admission    Recommendations  Patient destination: Home Follow up Recommendations: 24 hour supervision/assistance;Outpatient SLP    SLP Frequency 3 to 5 out of 7 days   SLP Duration  SLP Intensity  SLP Treatment/Interventions 16-20 days   Minumum of 1-2 x/day, 30 to 90 minutes  Cognitive remediation/compensation;Cueing hierarchy;Environmental controls;Functional tasks;Internal/external aids;Patient/family education    Pain Pain Assessment Pain Assessment: No/denies pain  Prior Functioning Cognitive/Linguistic Baseline: Within functional limits Type of Home: House  Lives With: Friend(s) Available Help at Discharge: Family;Available 24 hours/day Education: 2 yrs college  Vocation: Full time employment  Function:  Eating Eating   Modified Consistency Diet: No Eating Assist Level: Set up assist for   Eating Set Up Assist For: Opening containers;Cutting food       Cognition Comprehension Comprehension assist level: Follows basic conversation/direction with no  assist  Expression  Expression assist level: Expresses basic 75 - 89% of the time/requires cueing 10 - 24% of the time. Needs helper to occlude trach/needs to repeat words.  Social Interaction Social Interaction assist level: Interacts appropriately 90% of the time - Needs monitoring or encouragement for participation or interaction.  Problem Solving Problem solving assist level: Solves basic 90% of the time/requires cueing < 10% of the time  Memory Memory assist level: Recognizes or recalls 75 - 89% of the time/requires cueing 10 - 24% of the time   Short Term Goals: Week 1: SLP Short Term Goal 1 (Week 1): Pt will recall semi-complex daily information with supervision verbal cues for use of external aids.   SLP Short Term Goal 2 (Week 1): Pt will return demonstration of at least 2 safety precautions with supervision verbal cues.   SLP Short Term Goal 3 (Week 1): Pt will recognize and correct errors in the moment during semi-complex functional tasks with supervision verbal cues.   SLP Short Term Goal 4 (Week 1): Pt will utilize intelligibliity strategies to achieve articulatory precision at the conversational level with supervision.   SLP Short Term Goal 5 (Week 1): Pt will complete semi-complex word finding tasks with supervision verbal cues.    Refer to Care Plan for Long Term Goals  Recommendations for other services: None  Discharge Criteria: Patient will be discharged from SLP if patient refuses treatment 3 consecutive times without medical reason, if treatment goals not met, if there is a change in medical status, if patient makes no progress towards goals or if patient is discharged from hospital.  The above assessment, treatment plan, treatment alternatives and goals were discussed and mutually agreed upon: by patient  Emilio Math 10/29/2015, 4:07 PM

## 2015-10-29 NOTE — Progress Notes (Signed)
50 y.o. right handed limited English-speaking male with unremarkable past medical history on no scheduled medications at time of admission. Per chart review patient lives with family. Independent prior to admission. Family works during the day. One level home. Presented 10/25/2015 with right-sided weakness and slurred speech. Urine drug screen negative. Cranial CT scan with no acute changes. 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 MRI of the brain showed acute left basal ganglia nonhemorrhagic infarct. MRA with no emergent large vessel occlusion or stenosis. Carotid Dopplers had no ICA stenosis. Echocardiogram is pending. Patient did not receive TPA. Neurology follow-up currently on aspirin for CVA prophylaxis. Subcutaneous Lovenox for DVT prophylaxis.  Subjective/Complaints: "arm is dead"  ROS- denies pain or breathing issues  Objective: Vital Signs: Blood pressure 153/90, pulse 70, temperature 98 F (36.7 C), temperature source Oral, resp. rate 20, height '5\' 6"'$  (1.676 m), weight 68.13 kg (150 lb 3.2 oz), SpO2 99 %. No results found. Results for orders placed or performed during the hospital encounter of 10/27/15 (from the past 72 hour(s))  CBC     Status: None   Collection Time: 10/27/15  7:24 PM  Result Value Ref Range   WBC 6.4 4.0 - 10.5 K/uL   RBC 5.75 4.22 - 5.81 MIL/uL   Hemoglobin 16.5 13.0 - 17.0 g/dL   HCT 49.0 39.0 - 52.0 %   MCV 85.2 78.0 - 100.0 fL   MCH 28.7 26.0 - 34.0 pg   MCHC 33.7 30.0 - 36.0 g/dL   RDW 13.1 11.5 - 15.5 %   Platelets 202 150 - 400 K/uL  Creatinine, serum     Status: None   Collection Time: 10/27/15  7:24 PM  Result Value Ref Range   Creatinine, Ser 1.06 0.61 - 1.24 mg/dL   GFR calc non Af Amer >60 >60 mL/min   GFR calc Af Amer >60 >60 mL/min    Comment: (NOTE) The eGFR has been calculated using the CKD EPI equation. This calculation has not been validated in all clinical situations. eGFR's persistently <60 mL/min signify possible Chronic  Kidney Disease.   CBC WITH DIFFERENTIAL     Status: None   Collection Time: 10/28/15  7:18 AM  Result Value Ref Range   WBC 5.8 4.0 - 10.5 K/uL   RBC 5.79 4.22 - 5.81 MIL/uL   Hemoglobin 17.0 13.0 - 17.0 g/dL   HCT 49.2 39.0 - 52.0 %   MCV 85.0 78.0 - 100.0 fL   MCH 29.4 26.0 - 34.0 pg   MCHC 34.6 30.0 - 36.0 g/dL   RDW 13.3 11.5 - 15.5 %   Platelets 188 150 - 400 K/uL   Neutrophils Relative % 63 %   Neutro Abs 3.6 1.7 - 7.7 K/uL   Lymphocytes Relative 24 %   Lymphs Abs 1.4 0.7 - 4.0 K/uL   Monocytes Relative 7 %   Monocytes Absolute 0.4 0.1 - 1.0 K/uL   Eosinophils Relative 6 %   Eosinophils Absolute 0.4 0.0 - 0.7 K/uL   Basophils Relative 0 %   Basophils Absolute 0.0 0.0 - 0.1 K/uL  Comprehensive metabolic panel     Status: Abnormal   Collection Time: 10/28/15  7:18 AM  Result Value Ref Range   Sodium 139 135 - 145 mmol/L   Potassium 4.8 3.5 - 5.1 mmol/L   Chloride 105 101 - 111 mmol/L   CO2 27 22 - 32 mmol/L   Glucose, Bld 108 (H) 65 - 99 mg/dL   BUN 19 6 -  20 mg/dL   Creatinine, Ser 1.15 0.61 - 1.24 mg/dL   Calcium 9.6 8.9 - 10.3 mg/dL   Total Protein 7.3 6.5 - 8.1 g/dL   Albumin 4.1 3.5 - 5.0 g/dL   AST 30 15 - 41 U/L   ALT 50 17 - 63 U/L   Alkaline Phosphatase 54 38 - 126 U/L   Total Bilirubin 1.1 0.3 - 1.2 mg/dL   GFR calc non Af Amer >60 >60 mL/min   GFR calc Af Amer >60 >60 mL/min    Comment: (NOTE) The eGFR has been calculated using the CKD EPI equation. This calculation has not been validated in all clinical situations. eGFR's persistently <60 mL/min signify possible Chronic Kidney Disease.    Anion gap 7 5 - 15     HEENT: normal Cardio: RRR and no murmur Resp: CTA B/L and unlabored GI: BS positive and NT, ND Extremity:  Pulses positive and No Edema Skin:   Intact Neuro: Alert/Oriented, Normal Sensory and Abnormal Motor 0/5 in RUE, 3- R HF, KE, 0/5 R ankle DF/PF Musc/Skel:  Other no pain with UE or LE ROM Gen NAD   Assessment/Plan: 1.  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 Physiatrist is providing close team supervision and 24 hour management of active medical problems listed below. Physiatrist and rehab team continue to assess barriers to discharge/monitor patient progress toward functional and medical goals. FIM: Function - Bathing Position: Shower Body parts bathed by patient: Chest, Abdomen, Front perineal area, Buttocks, Right upper leg, Left upper leg, Left lower leg Body parts bathed by helper: Back, Right arm, Left arm, Right lower leg Assist Level: Touching or steadying assistance(Pt > 75%)  Function- Upper Body Dressing/Undressing What is the patient wearing?: Hospital gown Assist Level: Touching or steadying assistance(Pt > 75%) Function - Lower Body Dressing/Undressing What is the patient wearing?: Pants, Non-skid slipper socks Position: Wheelchair/chair at sink Pants- Performed by patient: Thread/unthread left pants leg, Pull pants up/down Pants- Performed by helper: Thread/unthread right pants leg Non-skid slipper socks- Performed by patient: Don/doff left sock Non-skid slipper socks- Performed by helper: Don/doff right sock Assist for footwear: Partial/moderate assist Assist for lower body dressing: Touching or steadying assistance (Pt > 75%)  Function - Toileting Toileting activity did not occur: Safety/medical concerns Toileting steps completed by patient: Performs perineal hygiene (1/2 steps) Toileting steps completed by helper: Adjust clothing prior to toileting  Function - Air cabin crew transfer activity did not occur: Safety/medical concerns Toilet transfer assistive device: Grab bar Assist level to toilet: Moderate assist (Pt 50 - 74%/lift or lower) Assist level from toilet: Moderate assist (Pt 50 - 74%/lift or  lower)  Function - Chair/bed transfer Chair/bed transfer method: Stand pivot Chair/bed transfer assist level: Moderate assist (Pt 50 - 74%/lift or lower) Chair/bed transfer assistive device: Armrests, Bedrails Chair/bed transfer details: Manual facilitation for weight shifting, Manual facilitation for placement, Verbal cues for technique, Verbal cues for sequencing  Function - Locomotion: Wheelchair Type: Manual Max wheelchair distance: 100 ft Assist Level: Supervision or verbal cues Assist Level: Supervision or verbal cues Wheel 150 feet activity did not occur: Safety/medical concerns Turns around,maneuvers to table,bed, and toilet,negotiates 3% grade,maneuvers on rugs and over doorsills: No Function - Locomotion: Ambulation Assistive device: Rail in hallway Max distance: 30 ft Assist level: Touching or steadying assistance (Pt > 75%) Assist level: Touching or steadying assistance (Pt >  75%) Walk 50 feet with 2 turns activity did not occur: Safety/medical concerns Walk 150 feet activity did not occur: Safety/medical concerns Walk 10 feet on uneven surfaces activity did not occur: Safety/medical concerns  Function - Comprehension Comprehension: Auditory Comprehension assist level: Follows basic conversation/direction with no assist  Function - Expression Expression: Verbal Expression assist level: Expresses basic needs/ideas: With no assist  Function - Social Interaction Social Interaction assist level: Interacts appropriately 90% of the time - Needs monitoring or encouragement for participation or interaction.  Function - Problem Solving Problem solving assist level: Solves basic problems with no assist  Function - Memory Memory assist level: More than reasonable amount of time Patient normally able to recall (first 3 days only): Current season, That he or she is in a hospital, Staff names and faces  Medical Problem List and Plan: 1.  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  DVT Prophylaxis/Anticoagulation: Subcutaneous Lovenox. Monitor for any bleeding episodes 3. Pain Management: Tylenol as needed 4. Hyperlipidemia. Lipitor 5. Neuropsych: This patient is capable of making decisions on his own behalf. 6. Skin/Wound Care: Routine skin checks 7. Fluids/Electrolytes/Nutrition: Routine I&O's with follow-up BMET wnl 8. HTN: Monitor, lopressor, prn clonidine 9. Prediabetes: Monitor   LOS (Days) 2 A FACE TO FACE EVALUATION WAS PERFORMED  Beonca Gibb E 10/29/2015, 8:53 AM

## 2015-10-30 ENCOUNTER — Inpatient Hospital Stay (HOSPITAL_COMMUNITY): Payer: Self-pay | Admitting: Occupational Therapy

## 2015-10-30 ENCOUNTER — Inpatient Hospital Stay (HOSPITAL_COMMUNITY): Payer: Self-pay | Admitting: Speech Pathology

## 2015-10-30 ENCOUNTER — Inpatient Hospital Stay (HOSPITAL_COMMUNITY): Payer: Self-pay | Admitting: Physical Therapy

## 2015-10-30 NOTE — Progress Notes (Signed)
Occupational Therapy Session Note  Patient Details  Name: Brad Tran MRN: [id number] Date of Birth: 03/05/1966  Today's Date: 10/30/2015 OT Individual Time: [id number] OT Individual Time Calculation (min): 45 min    Short Term Goals: Week 1:  OT Short Term Goal 1 (Week 1): Pt will perform stand step transfers with minA with min cuing consistently to toilet/ BSC OT Short Term Goal 2 (Week 1): Pt will don shirt from folded position with mod A OT Short Term Goal 3 (Week 1): Pt will maintainind standing balance to complete tooth brushing with min A OT Short Term Goal 4 (Week 1): Pt will incorporate right UE in functional bathing / dressing task with min questioning cuing  Skilled Therapeutic Interventions/Progress Updates:  Treatment session with focus on self care skills and safety awareness to include handling precautions for affected R side. Pt awake and alert in bed at start of session and agreeable to therapy. Pt completed sit > stand [facility] bed [facility] with Min A for safety and verbal cues to delay initiation due to impulsivity. Sit > [facility] shower with Min A. Pt demonstrates good recall from previous session regarding RLE placement prior to initiating transfers.   Pt completed showering tasks at S for safety due to impulsivity. UB dressing completed with verbal cues and Min A for pt demonstration of hemi dressing techniques with safe handling of affected UE. LB dressing completed with Min A for hemi dressing techniques and Min A for sit to stand.   Pt completed oral hygiene sit > stand from [facility] sink with close CGA to S for safety. Pt observed to demonstrate less rolling of R ankle during standing and transfers with increased stability of RLE. Left pt in recliner in room with quick release belt secured and all needs in reach.    Therapy Documentation Precautions:  Precautions Precautions: Fall Precaution Comments: R hemi Restrictions Weight Bearing Restrictions: No General:    Vital Signs:  Pain:   Pt with no c/o pain.  ADL: ADL ADL Comments: see functional navigator  See Function Navigator for Current Functional Status.   Therapy/Group: Individual Therapy  Leafy KindleLindsay Rebekah Zackery 10/30/2015, 12:02 PM

## 2015-10-30 NOTE — Progress Notes (Signed)
Occupational Therapy Session Note  Patient Details  Name: Brad Tran MRN: [id number] Date of Birth: 05/27/1965  Today's Date: 10/30/2015 OT Individual Time: [id number] OT Individual Time Calculation (min): 30 min    Short Term Goals: Week 1:  OT Short Term Goal 1 (Week 1): Pt will perform stand step transfers with minA with min cuing consistently to toilet/ BSC OT Short Term Goal 2 (Week 1): Pt will don shirt from folded position with mod A OT Short Term Goal 3 (Week 1): Pt will maintainind standing balance to complete tooth brushing with min A OT Short Term Goal 4 (Week 1): Pt will incorporate right UE in functional bathing / dressing task with min questioning cuing  Skilled Therapeutic Interventions/Progress Updates:    Treatment session with focus on RUE NMR.  Engaged in [place] with focus on shoulder flexion/extension and adduction/abduction.  Provided tactile cues at elbow and wrist to facilitate movement as well as decrease effects of gravity on movement. Progressed to weight bearing activity in standing with focus on crossing midline with LUE to further facilitate weight shift and weight bearing.  Following weight bearing again completed shoulder flexion/extension as well as internal/external rotation with pt able to demonstrate increased movements against gravity, including internal rotation against min resistance.  Min assist - min guard with stand pivot transfers.  Pt reports "I feel something" when questioned about arm post session.  Therapy Documentation Precautions:  Precautions Precautions: Fall Precaution Comments: R hemi Restrictions Weight Bearing Restrictions: No Pain: Pain Assessment Pain Assessment: No/denies pain  See Function Navigator for Current Functional Status.   Therapy/Group: Individual Therapy  Rosalio LoudHOXIE, [place] 10/30/2015, 3:14 PM

## 2015-10-30 NOTE — IPOC Note (Signed)
Overall Plan of Care [facility](IPOC) Patient Details Name: Brad Tran MRN: [id number] DOB: 04/25/1965  Admitting Diagnosis: LT CVA  Hospital Problems: Active Problems:   CVA (cerebral infarction)     Functional Problem List: Nursing Endurance, Medication Management, Safety  PT Balance, Behavior, Endurance, Motor, Sensory, Perception, Safety  OT Balance, Safety, Behavior, Skin Integrity, Cognition, Edema, Endurance, Motor, Nutrition, Pain, Perception  SLP Cognition, Linguistic  TR         Basic ADL's: OT Grooming, Bathing, Dressing, Toileting, Eating     Advanced  ADL's: OT       Transfers: PT Bed Mobility, Car, Bed to Chair, Occupational psychologisturniture  OT Toilet, Research scientist (life sciences)Tub/Shower     Locomotion: PT Ambulation, Psychologist, prison and probation servicesWheelchair Mobility, Stairs     Additional Impairments: OT Fuctional Use of Upper Extremity  SLP Communication, Social Cognition expression Memory, Awareness, Problem Solving  TR      Anticipated Outcomes Item Anticipated Outcome  Self Feeding supervision  Swallowing      Basic self-care  supervision  Toileting  supervision   Bathroom Transfers supervision  Bowel/Bladder  Continent bowel and bladder  Transfers  supervision  Locomotion  supervision  Communication  mod I   Cognition  Supervision   Pain  Managed at goal 2/10  Safety/Judgment  Increased safety awareness; anticipates needs. No falls, injury this admission.   Therapy Plan: PT Intensity: Minimum of 1-2 x/day ,45 to 90 minutes PT Frequency: 5 out of 7 days PT Duration Estimated Length of Stay: 16-19 days OT Intensity: Minimum of 1-2 x/day, 45 to 90 minutes OT Frequency: 5 out of 7 days OT Duration/Estimated Length of Stay: 16-20 days SLP Intensity: Minumum of 1-2 x/day, 30 to 90 minutes SLP Frequency: 3 to 5 out of 7 days SLP Duration/Estimated Length of Stay: 16-20 days        Team Interventions: Nursing Interventions Patient/Family Education, Psychosocial Support, Medication Management, Discharge  Planning, Disease Management/Prevention  PT interventions Ambulation/gait training, Discharge planning, Psychosocial support, Functional mobility training, Therapeutic Activities, Wheelchair propulsion/positioning, Therapeutic Exercise, Neuromuscular re-education, Disease management/prevention, Warden/rangerBalance/vestibular training, Cognitive remediation/compensation, DME/adaptive equipment instruction, Pain management, Splinting/orthotics, UE/LE Strength taining/ROM, UE/LE Coordination activities, Stair training, Patient/family education, MetLifeCommunity reintegration, Development worker, international aidunctional electrical stimulation  OT Interventions Warden/rangerBalance/vestibular training, Cognitive remediation/compensation, FirefighterCommunity reintegration, Discharge planning, Disease mangement/prevention, Fish farm managerDME/adaptive equipment instruction, Neuromuscular re-education, Functional mobility training, Functional electrical stimulation, Pain management, Self Care/advanced ADL retraining, Therapeutic Activities, UE/LE Coordination activities, Visual/perceptual remediation/compensation, Therapeutic Exercise, Skin care/wound managment, Patient/family education, Psychosocial support, Splinting/orthotics, UE/LE Strength taining/ROM, Wheelchair propulsion/positioning  SLP Interventions Cognitive remediation/compensation, Financial traderCueing hierarchy, Environmental controls, Functional tasks, Internal/external aids, Patient/family education  TR Interventions    SW/CM Interventions Discharge Planning, Psychosocial Support, Patient/Family Education    Team Discharge Planning: Destination: PT-Home (with daughter) ,OT- Home , SLP-Home Projected Follow-up: PT-Home health PT, 24 hour supervision/assistance, OT-  Outpatient OT, SLP-24 hour supervision/assistance, Outpatient SLP Projected Equipment Needs: PT-3 in 1 bedside comode, To be determined (w/c vs RW TBD), OT- To be determined, SLP-  Equipment Details: PT- , OT-  Patient/family involved in discharge planning: PT- Patient, Family  member/caregiver,  OT-Patient, SLP-Patient  MD ELOS: 10-14d Medical Rehab Prognosis:  Good Assessment: 50 yo male with 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  Now requiring 24/7 Rehab RN,MD, as well as CIR level PT, OT and SLP.  Treatment team will focus on ADLs and mobility with goals set at Supervision    See Team Conference Notes for weekly updates to the plan of care

## 2015-10-30 NOTE — Progress Notes (Signed)
Speech Language Pathology Daily Session Note  Patient Details  Name: Brad Tran MRN: [id number] Date of Birth: 01/07/1966  Today's Date: 10/30/2015 SLP Individual Time: [id number] SLP Individual Time Calculation (min): 30 min  Short Term Goals: Week 1: SLP Short Term Goal 1 (Week 1): Pt will recall semi-complex daily information with supervision verbal cues for use of external aids.   SLP Short Term Goal 2 (Week 1): Pt will return demonstration of at least 2 safety precautions with supervision verbal cues.   SLP Short Term Goal 3 (Week 1): Pt will recognize and correct errors in the moment during semi-complex functional tasks with supervision verbal cues.   SLP Short Term Goal 4 (Week 1): Pt will utilize intelligibliity strategies to achieve articulatory precision at the conversational level with supervision.   SLP Short Term Goal 5 (Week 1): Pt will complete semi-complex word finding tasks with supervision verbal cues.    Skilled Therapeutic Interventions:  Pt was seen for skilled ST targeting ongoing pt/family education. [name] was present during today's therapy session and verified pt's report of altered speech, word finding, and cognitive function.  Pt was able to recall 3 out of 4 speech intelligibility strategies from yesterday's therapy session with mod I.  SLP discussed memory compensatory strategies with pt and pt's daughter to maximize functional independence for recall of daily information.  Handout was provided to facilitate carryover in between therapy sessions.  SLP also recommended that pt have 24/7 supervision at discharge due to safety concerns and impulsivity. Pt required min assist verbal cues to identify how his current deficits will impact his functional independence in the home environment.  Daughter verbalized understanding of recommendations and in agreement.  Pt was returned to room at the end of today's therapy session and left in recliner with quick release belt  donned for safety.  Continue per current plan of care.   Function:  Eating Eating                 Cognition Comprehension Comprehension assist level: Follows basic conversation/direction with extra time/assistive device  Expression   Expression assist level: Expresses basic 75 - 89% of the time/requires cueing 10 - 24% of the time. Needs helper to occlude trach/needs to repeat words.  Social Interaction Social Interaction assist level: Interacts appropriately 90% of the time - Needs monitoring or encouragement for participation or interaction.  Problem Solving Problem solving assist level: Solves basic 90% of the time/requires cueing < 10% of the time  Memory Memory assist level: Recognizes or recalls 75 - 89% of the time/requires cueing 10 - 24% of the time    Pain Pain Assessment Pain Assessment: No/denies pain  Therapy/Group: Individual Therapy  Lani Havlik, Melanee SpryNicole L 10/30/2015, 3:47 PM

## 2015-10-30 NOTE — Progress Notes (Signed)
Physical Therapy Session Note  Patient Details  Name: Brad BrooksChhoun Kovacik MRN: [id number] Date of Birth: 10/23/1965  Today's Date: 10/30/2015 PT Individual Time: 0900-1030 PT Individual Time Calculation (min): 90 min   Short Term Goals: Week 1:  PT Short Term Goal 1 (Week 1): Pt will perform functional transfers with steady A.  PT Short Term Goal 2 (Week 1): Pt will ambulate 75 ft with LRAD & Min A. PT Short Term Goal 3 (Week 1): Pt will negotiate 12 steps with B rails & min A. PT Short Term Goal 4 (Week 1): Pt will demonstrate steady A standing balance with LRAD.  Skilled Therapeutic Interventions/Progress Updates:    Pt received seated in recliner, denies pain and agreeable to treatment. Stand pivot transfer x8 during session to various surfaces with min guard and min cues for RLE placement prior to standing to improve weight bearing and activation. Lateral side steps bilat directions with light UE support on rail; performed with no resistance and pushing 8lb box, cues for hip IR on R side to facilitate use of glute med over hip flexors. Dynamic gait in parallel bars with floor ladder including forward, backward, side stepping for increased hip/knee flexion to clear ladder and coordinate foot placement. Supine PNF RLE in D1 flexion/extension pattern with manual resistance, requires PROM for ankle dorsiflexion and stabilization. BLE D1 flexion/extension pattern in standing on 8" step to target on floor on the opposite side of stance foot for crossing midline; BUE support on table, LUE stabilizing RUE for weight bearing and to prevent trunk leaning to compensate during activity. Difficulty maintaining slight bend in R knee while simultaneously extending trunk/hip, however improved by second trial. Tall kneeling and half kneeling with UE support on bench while engaged in connect 4 game for cognitive dual task while weight bearing through RLE and engaging hip extensors. Required cues for game play to alternate  turns d/t impulsivity. Returned to room with w/c propulsion L hemi technique and S. Stand pivot to return to recliner with close S. Doffs shoes/socks/AFO with S and increased time, donned B non-skid socks with S. Remained seated in recliner with quick release belt intact and all needs within reach at completion of session.   Therapy Documentation Precautions:  Precautions Precautions: Fall Precaution Comments: R hemi Restrictions Weight Bearing Restrictions: No   See Function Navigator for Current Functional Status.   Therapy/Group: Individual Therapy  [name] 10/30/2015, 12:11 PM

## 2015-10-30 NOTE — Progress Notes (Signed)
50 y.o. right handed limited English-speaking male with unremarkable past medical history on no scheduled medications at time of admission. Per chart review patient lives with family. Independent prior to admission. Family works during the day. One level home. Presented 10/25/2015 with right-sided weakness and slurred speech. Urine drug screen negative. Cranial CT scan with no acute changes. 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 MRI of the brain showed acute left basal ganglia nonhemorrhagic infarct. MRA with no emergent large vessel occlusion or stenosis. Carotid Dopplers had no ICA stenosis. Echocardiogram is pending. Patient did not receive TPA. Neurology follow-up currently on aspirin for CVA prophylaxis. Subcutaneous Lovenox for DVT prophylaxis.  Subjective/Complaints: No issues overnite, "leg is stronger"  ROS- denies pain or breathing issues  Objective: Vital Signs: Blood pressure 147/92, pulse 74, temperature 98.3 F (36.8 C), temperature source Oral, resp. rate 18, height '5\' 6"'$  (1.676 m), weight 68.13 kg (150 lb 3.2 oz), SpO2 100 %. No results found. Results for orders placed or performed during the hospital encounter of 10/27/15 (from the past 72 hour(s))  CBC     Status: None   Collection Time: 10/27/15  7:24 PM  Result Value Ref Range   WBC 6.4 4.0 - 10.5 K/uL   RBC 5.75 4.22 - 5.81 MIL/uL   Hemoglobin 16.5 13.0 - 17.0 g/dL   HCT 49.0 39.0 - 52.0 %   MCV 85.2 78.0 - 100.0 fL   MCH 28.7 26.0 - 34.0 pg   MCHC 33.7 30.0 - 36.0 g/dL   RDW 13.1 11.5 - 15.5 %   Platelets 202 150 - 400 K/uL  Creatinine, serum     Status: None   Collection Time: 10/27/15  7:24 PM  Result Value Ref Range   Creatinine, Ser 1.06 0.61 - 1.24 mg/dL   GFR calc non Af Amer >60 >60 mL/min   GFR calc Af Amer >60 >60 mL/min    Comment: (NOTE) The eGFR has been calculated using the CKD EPI equation. This calculation has not been validated in all clinical situations. eGFR's persistently <60 mL/min  signify possible Chronic Kidney Disease.   CBC WITH DIFFERENTIAL     Status: None   Collection Time: 10/28/15  7:18 AM  Result Value Ref Range   WBC 5.8 4.0 - 10.5 K/uL   RBC 5.79 4.22 - 5.81 MIL/uL   Hemoglobin 17.0 13.0 - 17.0 g/dL   HCT 49.2 39.0 - 52.0 %   MCV 85.0 78.0 - 100.0 fL   MCH 29.4 26.0 - 34.0 pg   MCHC 34.6 30.0 - 36.0 g/dL   RDW 13.3 11.5 - 15.5 %   Platelets 188 150 - 400 K/uL   Neutrophils Relative % 63 %   Neutro Abs 3.6 1.7 - 7.7 K/uL   Lymphocytes Relative 24 %   Lymphs Abs 1.4 0.7 - 4.0 K/uL   Monocytes Relative 7 %   Monocytes Absolute 0.4 0.1 - 1.0 K/uL   Eosinophils Relative 6 %   Eosinophils Absolute 0.4 0.0 - 0.7 K/uL   Basophils Relative 0 %   Basophils Absolute 0.0 0.0 - 0.1 K/uL  Comprehensive metabolic panel     Status: Abnormal   Collection Time: 10/28/15  7:18 AM  Result Value Ref Range   Sodium 139 135 - 145 mmol/L   Potassium 4.8 3.5 - 5.1 mmol/L   Chloride 105 101 - 111 mmol/L   CO2 27 22 - 32 mmol/L   Glucose, Bld 108 (H) 65 - 99 mg/dL   BUN  19 6 - 20 mg/dL   Creatinine, Ser 2.25 0.61 - 1.24 mg/dL   Calcium 9.6 8.9 - 29.1 mg/dL   Total Protein 7.3 6.5 - 8.1 g/dL   Albumin 4.1 3.5 - 5.0 g/dL   AST 30 15 - 41 U/L   ALT 50 17 - 63 U/L   Alkaline Phosphatase 54 38 - 126 U/L   Total Bilirubin 1.1 0.3 - 1.2 mg/dL   GFR calc non Af Amer >60 >60 mL/min   GFR calc Af Amer >60 >60 mL/min    Comment: (NOTE) The eGFR has been calculated using the CKD EPI equation. This calculation has not been validated in all clinical situations. eGFR's persistently <60 mL/min signify possible Chronic Kidney Disease.    Anion gap 7 5 - 15     HEENT: normal Cardio: RRR and no murmur Resp: CTA B/L and unlabored GI: BS positive and NT, ND Extremity:  Pulses positive and No Edema Skin:   Intact Neuro: Alert/Oriented, Normal Sensory and Abnormal Motor 0/5 in RUE, 3 R HF, KE, 0/5 R ankle DF/PF Musc/Skel:  Other no pain with UE or LE ROM Gen  NAD   Assessment/Plan: 1. 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 Physiatrist is providing close team supervision and 24 hour management of active medical problems listed below. Physiatrist and rehab team continue to assess barriers to discharge/monitor patient progress toward functional and medical goals. FIM: Function - Bathing Position: Shower Body parts bathed by patient: Right arm, Left arm, Chest, Abdomen, Front perineal area, Buttocks, Right upper leg, Left upper leg, Right lower leg, Left lower leg Body parts bathed by helper: Back, Right arm, Left arm, Right lower leg Bathing not applicable: Back Assist Level: Touching or steadying assistance(Pt > 75%) (CGA.)  Function- Upper Body Dressing/Undressing What is the patient wearing?: Pull over shirt/dress Pull over shirt/dress - Perfomed by patient: Thread/unthread left sleeve, Put head through opening, Pull shirt over trunk Pull over shirt/dress - Perfomed by helper: Thread/unthread right sleeve Assist Level: Touching or steadying assistance(Pt > 75%) Function - Lower Body Dressing/Undressing What is the patient wearing?: Pants, Shoes Position: Wheelchair/chair at sink Pants- Performed by patient: Thread/unthread left pants leg, Pull pants up/down Pants- Performed by helper: Thread/unthread right pants leg Non-skid slipper socks- Performed by patient: Don/doff left sock Non-skid slipper socks- Performed by helper: Don/doff right sock Assist for footwear: Partial/moderate assist Assist for lower body dressing:  (Max A )  Function - Toileting Toileting activity did not occur: Safety/medical concerns Toileting steps completed by patient: Adjust clothing prior to toileting, Performs perineal hygiene, Adjust clothing after toileting Toileting steps completed by  helper: Adjust clothing prior to toileting Toileting Assistive Devices: Grab bar or rail Assist level: Touching or steadying assistance (Pt.75%)  Function - Archivist transfer activity did not occur: Safety/medical concerns Toilet transfer assistive device: Grab bar Assist level to toilet: Moderate assist (Pt 50 - 74%/lift or lower) Assist level from toilet: Moderate assist (Pt 50 - 74%/lift or lower)  Function - Chair/bed transfer Chair/bed transfer method: Stand pivot Chair/bed transfer assist level: Touching or steadying assistance (Pt > 75%) Chair/bed transfer assistive device: Armrests, Bedrails Chair/bed transfer details: Manual facilitation for weight shifting, Manual facilitation for placement, Verbal cues for technique, Verbal cues for sequencing  Function - Locomotion: Wheelchair Type: Manual Max wheelchair distance: 100 ft Assist Level: Supervision or verbal  cues Assist Level: Supervision or verbal cues Wheel 150 feet activity did not occur: Safety/medical concerns Turns around,maneuvers to table,bed, and toilet,negotiates 3% grade,maneuvers on rugs and over doorsills: No Function - Locomotion: Ambulation Assistive device: No device Max distance: 25 Assist level: Touching or steadying assistance (Pt > 75%) Assist level: Touching or steadying assistance (Pt > 75%) Walk 50 feet with 2 turns activity did not occur: Safety/medical concerns Walk 150 feet activity did not occur: Safety/medical concerns Walk 10 feet on uneven surfaces activity did not occur: Safety/medical concerns  Function - Comprehension Comprehension: Auditory Comprehension assist level: Follows basic conversation/direction with no assist  Function - Expression Expression: Verbal Expression assist level: Expresses basic 75 - 89% of the time/requires cueing 10 - 24% of the time. Needs helper to occlude trach/needs to repeat words.  Function - Social Interaction Social Interaction assist  level: Interacts appropriately 90% of the time - Needs monitoring or encouragement for participation or interaction.  Function - Problem Solving Problem solving assist level: Solves basic 90% of the time/requires cueing < 10% of the time  Function - Memory Memory assist level: Recognizes or recalls 75 - 89% of the time/requires cueing 10 - 24% of the time Patient normally able to recall (first 3 days only): Current season, That he or she is in a hospital, Staff names and faces  Medical Problem List and Plan: 1.  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  DVT Prophylaxis/Anticoagulation: Subcutaneous Lovenox. Monitor for any bleeding episodes 3. Pain Management: Tylenol as needed 4. Hyperlipidemia. Lipitor 5. Neuropsych: This patient is capable of making decisions on his own behalf. 6. Skin/Wound Care: Routine skin checks 7. Fluids/Electrolytes/Nutrition: Routine I&O's with follow-up BMET wnl 8. HTN: Monitor, lopressor, prn clonidine 9. Prediabetes: Monitor   LOS (Days) 3 A FACE TO FACE EVALUATION WAS PERFORMED  KIRSTEINS,ANDREW E 10/30/2015, 8:19 AM

## 2015-10-31 ENCOUNTER — Inpatient Hospital Stay (HOSPITAL_COMMUNITY): Payer: Self-pay

## 2015-10-31 ENCOUNTER — Inpatient Hospital Stay (HOSPITAL_COMMUNITY): Payer: Self-pay | Admitting: Occupational Therapy

## 2015-10-31 ENCOUNTER — Inpatient Hospital Stay (HOSPITAL_COMMUNITY): Payer: Self-pay | Admitting: Speech Pathology

## 2015-10-31 DIAGNOSIS — R7303 Prediabetes: Secondary | ICD-10-CM

## 2015-10-31 DIAGNOSIS — I10 Essential (primary) hypertension: Secondary | ICD-10-CM

## 2015-10-31 NOTE — Progress Notes (Signed)
Occupational Therapy Session Note  Patient Details  Name: Brad Tran MRN: [id number] Date of Birth: 05/09/1965  Today's Date: 10/31/2015 OT Individual Time: [id number] OT Individual Time Calculation (min): 48 min    Short Term Goals: Week 1:  OT Short Term Goal 1 (Week 1): Pt will perform stand step transfers with minA with min cuing consistently to toilet/ BSC OT Short Term Goal 2 (Week 1): Pt will don shirt from folded position with mod A OT Short Term Goal 3 (Week 1): Pt will maintainind standing balance to complete tooth brushing with min A OT Short Term Goal 4 (Week 1): Pt will incorporate right UE in functional bathing / dressing task with min questioning cuing  Skilled Therapeutic Interventions/Progress Updates:    Treatment session with focus on RUE NMR with use of e-stim as well as education on self-ROM exercises.  Setup E-stim and applied electrodes to elicit shoulder flexion and abduction.  Pt tolerated e-stim with no c/o pain or adverse reaction.  Engaged in [place] during after after application of e-stim with intermittent improvements in shoulder flexion > abduction.  Educated pt and pt's daughter on Self-ROM exercises with pt able to return demonstrate with min cues to correct positioning for safety and integrity of joint.  [name] asking questions regarding d/c date and goals, all questions answered to her satisfaction at this time.  Therapy Documentation Precautions:  Precautions Precautions: Fall Precaution Comments: R hemi Restrictions Weight Bearing Restrictions: No General:   Vital Signs: Therapy Vitals Temp: 98.2 F (36.8 C) Temp Source: Oral Pulse Rate: 76 Resp: 20 BP: (!) 128/97 mmHg Patient Position (if appropriate): Lying Oxygen Therapy SpO2: 97 % O2 Device: Not Delivered Pain:  Pt with no c/o pain ADL: ADL ADL Comments: see functional navigator  See Function Navigator for Current Functional Status.   Therapy/Group: Individual  Therapy  Rosalio LoudHOXIE, [place] 10/31/2015, 2:45 PM

## 2015-10-31 NOTE — Progress Notes (Signed)
Brad Tran is a [age] y.o. male 07/01/1965 [id number]  Subjective: No new complaints. No new problems. Slept well.  Objective: Vital signs in last 24 hours: Temp:  [97.9 F (36.6 C)-98.2 F (36.8 C)] 97.9 F (36.6 C) (07/15 0548) Pulse Rate:  [61-68] 64 (07/15 0548) Resp:  [17-18] 18 (07/15 0548) BP: (136-153)/(80-91) 153/91 mmHg (07/15 0548) SpO2:  [99 %] 99 % (07/15 0548) Weight change:  Last BM Date: 10/30/15  Intake/Output from previous day: 07/14 0701 - 07/15 0700 In: 600 [P.O.:600] Out: -   Physical Exam General: No apparent distress   [facility] Lungs: Normal effort. Lungs clear to auscultation, no crackles or wheezes. Cardiovascular: Regular rate and rhythm, no edema Neurological: No new neurological deficits   Lab Results: BMET    Component Value Date/Time   NA 139 10/28/2015 0718   K 4.8 10/28/2015 0718   CL 105 10/28/2015 0718   CO2 27 10/28/2015 0718   GLUCOSE 108* 10/28/2015 0718   BUN 19 10/28/2015 0718   CREATININE 1.15 10/28/2015 0718   CALCIUM 9.6 10/28/2015 0718   GFRNONAA >60 10/28/2015 0718   GFRAA >60 10/28/2015 0718   CBC    Component Value Date/Time   WBC 5.8 10/28/2015 0718   RBC 5.79 10/28/2015 0718   HGB 17.0 10/28/2015 0718   HCT 49.2 10/28/2015 0718   PLT 188 10/28/2015 0718   MCV 85.0 10/28/2015 0718   MCH 29.4 10/28/2015 0718   MCHC 34.6 10/28/2015 0718   RDW 13.3 10/28/2015 0718   LYMPHSABS 1.4 10/28/2015 0718   MONOABS 0.4 10/28/2015 0718   EOSABS 0.4 10/28/2015 0718   BASOSABS 0.0 10/28/2015 0718   CBG's (last 3):  No results for input(s): GLUCAP in the last 72 hours. LFT's Lab Results  Component Value Date   ALT 50 10/28/2015   AST 30 10/28/2015   ALKPHOS 54 10/28/2015   BILITOT 1.1 10/28/2015    Studies/Results: No results found.  Medications:  I have reviewed the patient's current medications. Scheduled Medications: . aspirin  300 mg Rectal Daily   Or  . aspirin  325 mg Oral Daily  .  atorvastatin  80 mg Oral q1800  . enoxaparin (LOVENOX) injection  40 mg Subcutaneous Q24H  . metoprolol tartrate  25 mg Oral BID   PRN Medications: cloNIDine, ondansetron **OR** ondansetron (ZOFRAN) IV, senna-docusate, sorbitol  Assessment/Plan: Active Problems:   CVA (cerebral infarction)  1. 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 DVT Prophylaxis/Anticoagulation: Subcutaneous Lovenox. Monitor for any bleeding episodes 3. Pain Management: Tylenol as needed 4. Hyperlipidemia. Lipitor 5. Neuropsych: This patient is capable of making decisions on his own behalf. 6. Skin/Wound Care: Routine skin checks 7. Fluids/Electrolytes/Nutrition: Routine I&O's with follow-up BMET wnl 8. HTN: Monitor, lopressor, prn clonidine 9. Prediabetes: Monitor cbgs prn  Length of stay, days: 4   Jamese Trauger A. Felicity CoyerLeschber, MD 10/31/2015, 11:41 AM

## 2015-10-31 NOTE — Progress Notes (Signed)
Speech Language Pathology Daily Session Note  Patient Details  Name: Brad Tran MRN: [id number] Date of Birth: 05/05/1965  Today's Date: 10/31/2015 SLP Individual Time: 1030-1100 SLP Individual Time Calculation (min): 30 min  Short Term Goals: Week 1: SLP Short Term Goal 1 (Week 1): Pt will recall semi-complex daily information with supervision verbal cues for use of external aids.   SLP Short Term Goal 2 (Week 1): Pt will return demonstration of at least 2 safety precautions with supervision verbal cues.   SLP Short Term Goal 3 (Week 1): Pt will recognize and correct errors in the moment during semi-complex functional tasks with supervision verbal cues.   SLP Short Term Goal 4 (Week 1): Pt will utilize intelligibliity strategies to achieve articulatory precision at the conversational level with supervision.   SLP Short Term Goal 5 (Week 1): Pt will complete semi-complex word finding tasks with supervision verbal cues.    Skilled Therapeutic Interventions: Skilled treatment session focused on cognition goals. SLP facilitated session by providing MOD I for recall of semi-complex daily information, pt demonstrated ability to refer to external aid for information. Pt unable to recall speech intelligibility strategies. He commented that his speech was ok so he didn't need to remember them. ST reviewed strategies as well as safety precautions. Upon entering room, pt was observed removing his safety belt. Nursing aware. Pt able to complete semi-complex word finding tasks with Min A to supervision cues. Pt Pt not able to return demonstrate 2 safety precautions as he didn't see need for safety belt or call bell. Pt left in recliner with safety belt replaced and nursing aware of pt's ability to remove belt. All needs left within reach.   Function:    Cognition Comprehension Comprehension assist level: Follows basic conversation/direction with extra time/assistive device  Expression   Expression assist  level: Expresses basic 75 - 89% of the time/requires cueing 10 - 24% of the time. Needs helper to occlude trach/needs to repeat words.  Social Interaction Social Interaction assist level: Interacts appropriately 90% of the time - Needs monitoring or encouragement for participation or interaction.  Problem Solving Problem solving assist level: Solves basic 90% of the time/requires cueing < 10% of the time  Memory Memory assist level: Recognizes or recalls 75 - 89% of the time/requires cueing 10 - 24% of the time    Pain Pain Assessment Pain Assessment: No/denies pain  Therapy/Group: Individual Therapy  Pattye Meda 10/31/2015, 11:19 AM

## 2015-10-31 NOTE — Progress Notes (Addendum)
Physical Therapy Note  Patient Details  Name: Brad Tran MRN: [id number] Date of Birth: 10/15/1965 Today's Date: 10/31/2015  0930-1030, 60 min individual tx Pain: none reported  Basic transfers wearing RAFO recliner> w/c> Nu Step with mod assist for eccentric control.  Pt propelled w/c room > gym using hemi technique, without cues.  neuromuscular re-education via visual feedback, demo, forced use for alternating reciprocal movements x 4 extremities at level 4 on NuStep x 10 min, R orthosis to hold hand on bar and focus on R hip neutral rotation; trunk shortening/lengthening/rotating and RUE wt bearing in supported sitting> unsupported sitting during reaching out of BOS with L hand and crossing midline; sit>< stand biased to R to retrieve cards with L hand and match to playing cards on vertical board in front of pt; blocked sit>< stand using Bobath method without use of UEs to promote forward wt shift.  Gait without AD x 15', mod assist with RAFO. Pt left resting in recliner with quick release belt applied and all needs within reach.   Jolisa Intriago 10/31/2015, 8:17 AM

## 2015-10-31 NOTE — Progress Notes (Signed)
Occupational Therapy Session Note  Patient Details  Name: Brad BrooksChhoun Stiner MRN: [id number] Date of Birth: 05/13/1965  Today's Date: 10/31/2015 OT Individual Time: [id number] OT Individual Time Calculation (min): 61 min    Short Term Goals: Week 1:  OT Short Term Goal 1 (Week 1): Pt will perform stand step transfers with minA with min cuing consistently to toilet/ [facility] OT Short Term Goal 2 (Week 1): Pt will don shirt from folded position with mod A OT Short Term Goal 3 (Week 1): Pt will maintainind standing balance to complete tooth brushing with min A OT Short Term Goal 4 (Week 1): Pt will incorporate right UE in functional bathing / dressing task with min questioning cuing  Skilled Therapeutic Interventions/Progress Updates:    Treatment session with focus on self care activities, RUE weight bearing, and functional transfers. Pt awake and alert at start of session, agreeable to therapy. Sit > stand transfer with Min A for safety from chair <> WC. Pt completed showering tasks at S to Min A due to impulsivity and for assist with washing RUE. Pt demonstrates good awareness of RUE as evidenced by attempts to use RUE as gross stabilizer during showering tasks.   Pt demonstrates good recall of hemi dressing techniques with verbal cues provided post UB dressing to bring shirt over elbow of RUE prior to pulling shirt over head. LB dressing completed at Min A due to shoes. Pt completed oral hygiene standing at sink with S.   Pt completed fine motor tasks in therapy gym at standing with sit > stand from EOM. Clinician provided tactile cues at knee of RLE to facilitate BLE weight bearing for safety and to facilitate motor recovery. Cognitive component and clinician facilitated RUE weight bearing added to increase challenge and facilitate motor return. Min verbal cues for task sequencing provided.   Pt left in recliner of room with QRB secured and all needs in reach.   Therapy Documentation Precautions:   Precautions Precautions: Fall Precaution Comments: R hemi Restrictions Weight Bearing Restrictions: No General:   Vital Signs: Therapy Vitals Temp: 97.9 F (36.6 C) Temp Source: Oral Pulse Rate: 64 Resp: 18 BP: (!) 153/91 mmHg Patient Position (if appropriate): Lying Oxygen Therapy SpO2: 99 % O2 Device: Not Delivered Pain:   Pt c/o pain in RLE. Difficult to rate due to cognitive/language issue.  ADL: ADL ADL Comments: see functional navigator  See Function Navigator for Current Functional Status.   Therapy/Group: Individual Therapy  Leafy KindleLindsay Alyzabeth Pontillo 10/31/2015, 8:38 AM

## 2015-11-01 NOTE — Progress Notes (Signed)
Brad BrooksChhoun Tran is a 50 y.o. male 10/26/1965 [id number]  Subjective: No new complaints. No new problems. Slept well.  Objective: Vital signs in last 24 hours: Temp:  [98.2 F (36.8 C)] 98.2 F (36.8 C) (07/16 0534) Pulse Rate:  [61-76] 61 (07/16 0534) Resp:  [18-20] 18 (07/16 0534) BP: (128-146)/(80-97) 142/93 mmHg (07/16 0534) SpO2:  [97 %-99 %] 99 % (07/16 0534) Weight change:  Last BM Date: 10/30/15  Intake/Output from previous day: 07/15 0701 - 07/16 0700 In: 1200 [P.O.:1200] Out: -   Physical Exam General: No apparent distress   [facility] L[facility]: Normal effort. Lungs clear to auscultation, no crackles or wheezes. Cardiovascular: Regular rate and rhythm, no edema Neurological: No new neurological deficits R HP   Lab Results: BMET    Component Value Date/Time   NA 139 10/28/2015 0718   K 4.8 10/28/2015 0718   CL 105 10/28/2015 0718   CO2 27 10/28/2015 0718   GLUCOSE 108* 10/28/2015 0718   BUN 19 10/28/2015 0718   CREATININE 1.15 10/28/2015 0718   CALCIUM 9.6 10/28/2015 0718   GFRNONAA >60 10/28/2015 0718   GFRAA >60 10/28/2015 0718   CBC    Component Value Date/Time   WBC 5.8 10/28/2015 0718   RBC 5.79 10/28/2015 0718   HGB 17.0 10/28/2015 0718   HCT 49.2 10/28/2015 0718   PLT 188 10/28/2015 0718   MCV 85.0 10/28/2015 0718   MCH 29.4 10/28/2015 0718   MCHC 34.6 10/28/2015 0718   RDW 13.3 10/28/2015 0718   LYMPHSABS 1.4 10/28/2015 0718   MONOABS 0.4 10/28/2015 0718   EOSABS 0.4 10/28/2015 0718   BASOSABS 0.0 10/28/2015 0718   CBG's (last 3):  No results for input(s): GLUCAP in the last 72 hours. LFT's Lab Results  Component Value Date   ALT 50 10/28/2015   AST 30 10/28/2015   ALKPHOS 54 10/28/2015   BILITOT 1.1 10/28/2015    Studies/Results: No results found.  Medications:  I have reviewed the patient's current medications. Scheduled Medications: . aspirin  300 mg Rectal Daily   Or  . aspirin  325 mg Oral Daily  . atorvastatin   80 mg Oral q1800  . enoxaparin (LOVENOX) injection  40 mg Subcutaneous Q24H  . metoprolol tartrate  25 mg Oral BID   PRN Medications: cloNIDine, ondansetron **OR** ondansetron (ZOFRAN) IV, senna-docusate, sorbitol  Assessment/Plan: Active Problems:   CVA (cerebral infarction)  1. 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 DVT Prophylaxis/Anticoagulation: Subcutaneous Lovenox. Monitor for any bleeding episodes 3. Pain Management: Tylenol as needed 4. Hyperlipidemia. Lipitor 5. Neuropsych: This patient is capable of making decisions on his own behalf. 6. Skin/Wound Care: Routine skin checks 7. Fluids/Electrolytes/Nutrition: Routine I&O's with follow-up BMET wnl 8. HTN: Monitor, lopressor, prn clonidine 9. Prediabetes: Monitor cbgs prn  Length of stay, days: 5   [name] A. Felicity CoyerLeschber, MD 11/01/2015, 10:06 AM

## 2015-11-02 ENCOUNTER — Inpatient Hospital Stay (HOSPITAL_COMMUNITY): Payer: Self-pay | Admitting: Physical Therapy

## 2015-11-02 ENCOUNTER — Inpatient Hospital Stay (HOSPITAL_COMMUNITY): Payer: Self-pay | Admitting: Occupational Therapy

## 2015-11-02 ENCOUNTER — Inpatient Hospital Stay (HOSPITAL_COMMUNITY): Payer: Self-pay | Admitting: Speech Pathology

## 2015-11-02 NOTE — Progress Notes (Signed)
Physical Therapy Session Note  Patient Details  Name: Brad Tran MRN: [id number] Date of Birth: 04/09/1966  Today's Date: 11/02/2015 PT Individual Time: 1000-1100 PT Individual Time Calculation (min): 60 min   Short Term Goals: Week 1:  PT Short Term Goal 1 (Week 1): Pt will perform functional transfers with steady A.  PT Short Term Goal 2 (Week 1): Pt will ambulate 75 ft with LRAD & Min A. PT Short Term Goal 3 (Week 1): Pt will negotiate 12 steps with B rails & min A. PT Short Term Goal 4 (Week 1): Pt will demonstrate steady A standing balance with LRAD.  Skilled Therapeutic Interventions/Progress Updates:    Pt received seated in recliner, denies pain and agreeable to treatment. Pt's sister reports pt got out of bed and into recliner without shoes/socks on and without assist. Reminded pt and sister, and later in session discussed with daughter, of request that pt use nursing staff to transfer from w/c <>bed, all agreeable. W/c propulsion with BLE for RLE coordination and strengthening. Standing kinetron x4 trials 60-90 seconds per trial for RLE weight bearing, stance control. Visual feedback with mirror for knee control to prevent hip ER. Tall kneeling and half kneeling with alternating LEs in front for RLE stance control, hip/knee control; mirror again used for visual feedback to maintain knee in midline while performing anterior weight shifts onto RLE. Gait x150' and x200' with minA/min guard to facilitate L weight shift, improve R glute activation in standing for stance control. Returned to room totalA for energy conservation, remained seated in recliner with all needs in reach and quick release belt intact.       Therapy Documentation Precautions:  Precautions Precautions: Fall Precaution Comments: R hemi Restrictions Weight Bearing Restrictions: No Pain: Pain Assessment Pain Assessment: No/denies pain   See Function Navigator for Current Functional Status.   Therapy/Group:  Individual Therapy  [name] 11/02/2015, 12:30 PM

## 2015-11-02 NOTE — Progress Notes (Signed)
50 y.o. right handed limited English-speaking male with unremarkable past medical history on no scheduled medications at time of admission. Per chart review patient lives with family. Independent prior to admission. Family works during the day. One level home. Presented 10/25/2015 with right-sided weakness and slurred speech. Urine drug screen negative. Cranial CT scan with no acute changes. 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 MRI of the brain showed acute left basal ganglia nonhemorrhagic infarct. MRA with no emergent large vessel occlusion or stenosis. Carotid Dopplers had no ICA stenosis. Echocardiogram is pending. Patient did not receive TPA. Neurology follow-up currently on aspirin for CVA prophylaxis. Subcutaneous Lovenox for DVT prophylaxis.  Subjective/Complaints: No new problems, discouraged about poor arm strength  ROS- denies pain or breathing issues  Objective: Vital Signs: Blood pressure 132/85, pulse 72, temperature 97.8 F (36.6 C), temperature source Oral, resp. rate 18, height  (1.676 m), weight 68.13 kg (150 lb 3.2 oz), SpO2 99 %. No results found. No results found for this or any previous visit (from the past 72 hour(s)).   HEENT: normal Cardio: RRR and no murmur Resp: CTA B/L and unlabored GI: BS positive and NT, ND Extremity:  Pulses positive and No Edema Skin:   Intact Neuro: Alert/Oriented, Normal Sensory and Abnormal Motor 0/5 in RUE, 3 R HF, KE, 0/5 R ankle DF/PF Musc/Skel:  Other no pain with UE or LE ROM Gen NAD   Assessment/Plan: 1. 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 Physiatrist is providing close team supervision and 24 hour management of active medical problems listed below. Physiatrist and rehab team continue to assess barriers to discharge/monitor patient progress toward  functional and medical goals. FIM: Function - Bathing Position: Shower Body parts bathed by patient: Right arm, Chest, Abdomen, Front perineal area, Buttocks, Right upper leg, Left upper leg, Right lower leg, Left lower leg, Back, Left arm Body parts bathed by helper: Left arm Bathing not applicable: Back Assist Level: Touching or steadying assistance(Pt > 75%) (Assist holding RUE for pt to wash.)  Function- Upper Body Dressing/Undressing What is the patient wearing?: Pull over shirt/dress Pull over shirt/dress - Perfomed by patient: Thread/unthread right sleeve, Thread/unthread left sleeve, Put head through opening, Pull shirt over trunk Pull over shirt/dress - Perfomed by helper: Thread/unthread right sleeve Assist Level: Supervision or verbal cues Function - Lower Body Dressing/Undressing What is the patient wearing?: Pants, Underwear, Faythe Ding[place], Shoes Position: Research officer, trade union at [employer] - Performed by patient: Thread/unthread right underwear leg, Thread/unthread left underwear leg, Pull underwear up/down Underwear - Performed by helper: Thread/unthread right underwear leg Pants- Performed by patient: Thread/unthread right pants leg, Thread/unthread left pants leg, Pull pants up/down Pants- Performed by helper: Thread/unthread right pants leg Non-skid slipper socks- Performed by patient: Don/doff left sock Non-skid slipper socks- Performed by helper: Don/doff right sock Shoes - Performed by patient: Don/doff left shoe Shoes - Performed by helper: Don/doff right shoe TED Hose - Performed by helper: Don/doff right TED hose, Don/doff left TED hose Assist for footwear: Partial/moderate assist Assist for lower body dressing: Touching or steadying assistance (Pt > 75%)  Function - Toileting Toileting activity did not occur: Safety/medical concerns Toileting steps completed by patient: Adjust clothing prior to toileting, Performs perineal hygiene, Adjust clothing after  toileting Toileting steps completed by helper: Adjust clothing after toileting Toileting Assistive Devices: Grab bar or rail Assist level: Touching or  steadying assistance (Pt.75%)  Function - ArchivistToilet Transfers Toilet transfer activity did not occur: Safety/medical concerns Toilet transfer assistive device: Grab bar Assist level to toilet: Moderate assist (Pt 50 - 74%/lift or lower) Assist level from toilet: Moderate assist (Pt 50 - 74%/lift or lower)  Function - Chair/bed transfer Chair/bed transfer method: Stand pivot Chair/bed transfer assist level: Moderate assist (Pt 50 - 74%/lift or lower) Chair/bed transfer assistive device: Armrests Chair/bed transfer details: Verbal cues for technique, Verbal cues for sequencing, Verbal cues for precautions/safety, Tactile cues for weight bearing  Function - Locomotion: Wheelchair Type: Manual Max wheelchair distance: 120 Assist Level: Supervision or verbal cues Assist Level: Supervision or verbal cues Wheel 150 feet activity did not occur: Safety/medical concerns Turns around,maneuvers to table,bed, and toilet,negotiates 3% grade,maneuvers on rugs and over doorsills: No Function - Locomotion: Ambulation Assistive device: No device Max distance: 15 Assist level: Moderate assist (Pt 50 - 74%) Assist level: Moderate assist (Pt 50 - 74%) Walk 50 feet with 2 turns activity did not occur: Safety/medical concerns Walk 150 feet activity did not occur: Safety/medical concerns Walk 10 feet on uneven surfaces activity did not occur: Safety/medical concerns  Function - Comprehension Comprehension: Auditory Comprehension assist level: Follows basic conversation/direction with extra time/assistive device  Function - Expression Expression: Verbal Expression assist level: Expresses basic 75 - 89% of the time/requires cueing 10 - 24% of the time. Needs helper to occlude trach/needs to repeat words.  Function - Social Interaction Social Interaction  assist level: Interacts appropriately 90% of the time - Needs monitoring or encouragement for participation or interaction.  Function - Problem Solving Problem solving assist level: Solves basic 75 - 89% of the time/requires cueing 10 - 24% of the time  Function - Memory Memory assist level: Recognizes or recalls 75 - 89% of the time/requires cueing 10 - 24% of the time Patient normally able to recall (first 3 days only): Current season, That he or she is in a hospital, Staff names and faces  Medical Problem List and Plan: 1.  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  DVT Prophylaxis/Anticoagulation: Subcutaneous Lovenox. Monitor for any bleeding episodes 3. Pain Management: Tylenol as needed 4. Hyperlipidemia. Lipitor 5. Neuropsych: This patient is capable of making decisions on his own behalf. 6. Skin/Wound Care: Routine skin checks 7. Fluids/Electrolytes/Nutrition: Routine I&O's with follow-up BMET wnl 8. HTN: Monitor, lopressor, prn clonidine 9. Prediabetes: Monitor   LOS (Days) 6 A FACE TO FACE EVALUATION WAS PERFORMED  KIRSTEINS,ANDREW E 11/02/2015, 8:41 AM

## 2015-11-02 NOTE — Progress Notes (Signed)
Occupational Therapy Session Note  Patient Details  Name: Donita BrooksChhoun Roe MRN: [id number] Date of Birth: 02/09/1966  Today's Date: 11/02/2015 OT Individual Time: [id number] and [id number] OT Individual Time Calculation (min): 45 min and 45 min    Short Term Goals: Week 1:  OT Short Term Goal 1 (Week 1): Pt will perform stand step transfers with minA with min cuing consistently to toilet/ BSC OT Short Term Goal 2 (Week 1): Pt will don shirt from folded position with mod A OT Short Term Goal 3 (Week 1): Pt will maintainind standing balance to complete tooth brushing with min A OT Short Term Goal 4 (Week 1): Pt will incorporate right UE in functional bathing / dressing task with min questioning cuing  Skilled Therapeutic Interventions/Progress Updates:    1 of 2) Treatment session with focus on self care skills, safety awareness, and NMR. Pt completed chair <> WC transfers with Min A for steadying, weight shifting, and safety. Supervision in shower for safety awareness due to pt continued impulsivity and lack of awareness of deficits. Verbal cues provided for safety in RUE placement and positioning. Pt demonstrates handling of RUE using fingers and gross grasp with LUE on R hand; verbal cues provided for correct and safe handling.   Pt completed LB dressing with supervision and UB dressing with verbal cues for hemi dressing techniques. Prompts provided for pt to place shirt over R elbow to ensure placement during overhead dressing tasks. Engaged in problem solving for pt donning R shoe with AFO independently. Clinician provided verbal cues with pt sister stepping in unprompted several minutes into activity. Continue to address and problem solve.   PROM with RUE at end of session to determine motor control return. Pt currently at 2-/5 in R shoulder. Clinician provided assist with RUE movement with gravity to facilitate gross motor return. Reiterated RUE handling precautions with pt for safety.   Pt left  in recliner with all needs in reach and QRB fastened.   2 of 2) Treatment session with focus on NMR. Pt completed D1/D2 flexion/extension activity with LUE at high kneeling with clinician-assisted weight bearing through RUE to facilitate motor return via proprioceptive input. Layered cognitive component to increase time in semi-quad position due to pt rapidity with task and overall activity completion.   Transitioned to quadruped position with x2 clinicians assist for trunk position and RUE weight bearing positioning. Pt completed D1/D2 activity with LUE and weight bearing through RUE. Engaged in [place] post weight bearing. Pt reported that he "feels something" in RUE which he confirmed to clinician to be overall proximal gross motor return.   [facility] for fwd motion and LLE for steering. Verbal cues required for navigating social hazards in hallway and to encourage pt to propel more slowly for safe handling.   Continue with current plan of care. Continue to review safety concerns with pt regarding need for supervision post d/c. Ensure pt breathing during PROM tasks by having pt count aloud.   Pt left in recliner of room with QRB fastened.   Therapy Documentation Precautions:  Precautions Precautions: Fall Precaution Comments: R hemi Restrictions Weight Bearing Restrictions: No General:   Vital Signs:   Pain:  Pt with no c/o pain.  ADL: ADL ADL Comments: see functional navigator Exercises:   Other Treatments:    See Function Navigator for Current Functional Status.   Therapy/Group: Individual Therapy  Leafy KindleLindsay Gissell Barra 11/02/2015, 9:26 AM

## 2015-11-02 NOTE — Progress Notes (Signed)
Speech Language Pathology Daily Session Note  Patient Details  Name: Brad BrooksChhoun Guillette MRN: [id number] Date of Birth: 08/16/1965  Today's Date: 11/02/2015 SLP Individual Time: 1403-1500 SLP Individual Time Calculation (min): 57 min  Short Term Goals: Week 1: SLP Short Term Goal 1 (Week 1): Pt will recall semi-complex daily information with supervision verbal cues for use of external aids.   SLP Short Term Goal 2 (Week 1): Pt will return demonstration of at least 2 safety precautions with supervision verbal cues.   SLP Short Term Goal 3 (Week 1): Pt will recognize and correct errors in the moment during semi-complex functional tasks with supervision verbal cues.   SLP Short Term Goal 4 (Week 1): Pt will utilize intelligibliity strategies to achieve articulatory precision at the conversational level with supervision.   SLP Short Term Goal 5 (Week 1): Pt will complete semi-complex word finding tasks with supervision verbal cues.    Skilled Therapeutic Interventions:  Pt was seen for skilled ST targeting cognitive goals.  SLP began session reviewing and reinforcing goals of therapy, pt verbalized understanding and was in agreement with previously established plan of care.  Therapist then facilitated the session with a novel card and explained its function in addressing goals for memory and word finding.  Pt required min assist verbal cues for word finding and supervision verbal cues for working memory during the abovementioned task.  Despite therapist's introduction of task, pt reported he didn't understand "the purpose" of the task and reported that he had "no trouble" with memory or word finding.  Therapist reiterated goals of therapy and recommendation for 24/7 supervision at discharge.  Pt required mod assist question cues to identify current physical and cognitive limitations s/p CVA and their impact on pt's independence in the home environment.  Discussed with pt and pt's family at the end of today's  therapy session.  Family was in agreement with focus of therapies being on safety awareness and awareness of deficits.  Pt left in recliner with call bell within reach.  Family at bedside.  Continue per current plan of care.     Function:  Eating Eating                 Cognition Comprehension Comprehension assist level: Follows basic conversation/direction with extra time/assistive device  Expression   Expression assist level: Expresses basic 90% of the time/requires cueing < 10% of the time.  Social Interaction Social Interaction assist level: Interacts appropriately 90% of the time - Needs monitoring or encouragement for participation or interaction.  Problem Solving Problem solving assist level: Solves basic 75 - 89% of the time/requires cueing 10 - 24% of the time  Memory Memory assist level: Recognizes or recalls 75 - 89% of the time/requires cueing 10 - 24% of the time    Pain Pain Assessment Pain Assessment: No/denies pain  Therapy/Group: Individual Therapy  Demir Titsworth, Melanee SpryNicole L 11/02/2015, 4:03 PM

## 2015-11-03 ENCOUNTER — Inpatient Hospital Stay (HOSPITAL_COMMUNITY): Payer: Self-pay | Admitting: Occupational Therapy

## 2015-11-03 ENCOUNTER — Inpatient Hospital Stay (HOSPITAL_COMMUNITY): Payer: Self-pay | Admitting: Speech Pathology

## 2015-11-03 ENCOUNTER — Inpatient Hospital Stay (HOSPITAL_COMMUNITY): Payer: Self-pay | Admitting: Physical Therapy

## 2015-11-03 LAB — CREATININE, SERUM: CREATININE: 1 mg/dL (ref 0.61–1.24)

## 2015-11-03 NOTE — Progress Notes (Signed)
Physical Therapy Session Note  Patient Details  Name: Brad Tran MRN: [id number] Date of Birth: 06/25/1965  Today's Date: 11/03/2015 PT Individual Time: 1100-1200 PT Individual Time Calculation (min): 60 min   Short Term Goals: Week 1:  PT Short Term Goal 1 (Week 1): Pt will perform functional transfers with steady A.  PT Short Term Goal 2 (Week 1): Pt will ambulate 75 ft with LRAD & Min A. PT Short Term Goal 3 (Week 1): Pt will negotiate 12 steps with B rails & min A. PT Short Term Goal 4 (Week 1): Pt will demonstrate steady A standing balance with LRAD.  Skilled Therapeutic Interventions/Progress Updates:    Pt received seated in w/c, denies pain and agreeable to treatment. W/c propulsion x75' with BLE for RLE coordination and strengthening. Gait x50' with no AD and RLE AFO; note R knee recurvatum in >75% of steps. Heel wedge added to R shoe with noted improvement in recurvatum to approximatey 10% of R steps. Stand pivot transfer w/c <>mat table with close S. Sit <>stand with 4" step under LLE to facilitate R weight shift and stance control. Requires verbal/tactile cues for R weight shift. Standing alternating toe taps to step, requires min guard overall however several LOB to R side. Gait in parallel bars forward/side stepping over floor ladder to improve RLE foot clearance, R abuction, RLE stance control and L weight shift. Gait to return to room x160' with min guard; one major LOB requiring maxA to recover. Remained seated in recliner with family present at completion of session, all needs in reach.   Therapy Documentation Precautions:  Precautions Precautions: Fall Precaution Comments: R hemi Restrictions Weight Bearing Restrictions: No Pain: Pain Assessment Pain Assessment: No/denies pain   See Function Navigator for Current Functional Status.   Therapy/Group: Individual Therapy  [name] 11/03/2015, 12:21 PM

## 2015-11-03 NOTE — Progress Notes (Signed)
Occupational Therapy Session Note  Patient Details  Name: Brad Tran MRN: [id number] Date of Birth: 02/05/1966  Today's Date: 11/03/2015 OT Individual Time: 1415-1500 OT Individual Time Calculation (min): 45 min    Short Term Goals: Week 1:  OT Short Term Goal 1 (Week 1): Pt will perform stand step transfers with minA with min cuing consistently to toilet/ BSC OT Short Term Goal 2 (Week 1): Pt will don shirt from folded position with mod A OT Short Term Goal 3 (Week 1): Pt will maintainind standing balance to complete tooth brushing with min A OT Short Term Goal 4 (Week 1): Pt will incorporate right UE in functional bathing / dressing task with min questioning cuing  Skilled Therapeutic Interventions/Progress Updates:    Treatment session with focus on RUE NMR with use of e-stim and Bioness.  Setup E-stim and applied electrodes to elicit shoulder flexion and abduction. Utilized UE Ranger while e-stim connected for 10 mins with focus on increased active shoulder flexion, while therapist assisted at elbow to decrease effects of gravity and facilitation of elbow flexion/extension.  Pt tolerated e-stim with no c/o pain or adverse reaction. Implemented Bioness with focus on grasp/release with finger flexion/extension to elicit movement and provide psychosocial support with movement.  Educated on typical recovery pattern and encouraged continued use of self-ROM HEP.  Therapy Documentation Precautions:  Precautions Precautions: Fall Precaution Comments: R hemi Restrictions Weight Bearing Restrictions: No General:   Vital Signs: Therapy Vitals Temp: 98.2 F (36.8 C) Temp Source: Oral Pulse Rate: 65 Resp: 18 BP: 129/85 mmHg Patient Position (if appropriate): Lying Oxygen Therapy SpO2: 98 % O2 Device: Not Delivered Pain: Pain Assessment Pain Assessment: No/denies pain ADL: ADL ADL Comments: see functional navigator Exercises:   Other Treatments:    See Function Navigator for  Current Functional Status.   Therapy/Group: Individual Therapy  Rosalio LoudHOXIE, [name] 11/03/2015, 3:17 PM

## 2015-11-03 NOTE — Progress Notes (Signed)
Subjective/Complaints: SOme arm movement with faciliatation and Tran stim yesterday  ROS- denies pain or breathing issues  Objective: Vital Signs: Blood pressure 144/99, pulse 57, temperature 97.8 F (36.6 C), temperature source Oral, resp. rate 16, height '5\' 6"'$  (1.676 m), weight 68.13 kg (150 lb 3.2 oz), SpO2 100 %. No results found. Results for orders placed or performed during the hospital encounter of 10/27/15 (from the past 72 hour(s))  Creatinine, serum     Status: None   Collection Time: 11/03/15  5:49 AM  Result Value Ref Range   Creatinine, Ser 1.00 0.61 - 1.24 mg/dL   GFR calc non Af Amer >60 >60 mL/min   GFR calc Af Amer >60 >60 mL/min    Comment: (NOTE) The eGFR has been calculated using the CKD EPI equation. This calculation has not been validated in all clinical situations. eGFR's persistently <60 mL/min signify possible Chronic Kidney Disease.      HEENT: normal Cardio: RRR and no murmur Resp: CTA B/L and unlabored GI: BS positive and NT, ND Extremity:  Pulses positive and No Edema Skin:   Intact Neuro: Alert/Oriented, Normal Sensory and Abnormal Motor 0/5 in RUE, 3 R HF, KE, 0/5 R ankle DF/PF Musc/Skel:  Other no pain with UE or LE ROM Gen NAD   Assessment/Plan: 1. 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 Physiatrist is providing close team supervision and 24 hour management of active medical problems listed below. Physiatrist and rehab team continue to assess barriers to discharge/monitor patient progress toward functional and medical goals. FIM: Function - Bathing Position: Shower Body parts bathed by patient: Right arm, Chest, Abdomen, Front perineal area, Buttocks, Right upper leg, Left upper leg, Right lower leg, Left lower leg Body parts bathed by helper: Left arm Bathing not  applicable: Back, Left arm Assist Level: Touching or steadying assistance(Pt > 75%)  Function- Upper Body Dressing/Undressing What is the patient wearing?: Pull over shirt/dress Pull over shirt/dress - Perfomed by patient: Thread/unthread right sleeve, Thread/unthread left sleeve, Put head through opening, Pull shirt over trunk Pull over shirt/dress - Perfomed by helper: Thread/unthread right sleeve Assist Level: Supervision or verbal cues Function - Lower Body Dressing/Undressing What is the patient wearing?: Pants, [employer], Shoes, Underwear Position: Education officer, museum at [employer] - Performed by patient: Thread/unthread right underwear leg, Thread/unthread left underwear leg, Pull underwear up/down Underwear - Performed by helper: Thread/unthread right underwear leg Pants- Performed by patient: Thread/unthread right pants leg, Thread/unthread left pants leg, Pull pants up/down Pants- Performed by helper: Thread/unthread right pants leg Non-skid slipper socks- Performed by patient: Don/doff left sock Non-skid slipper socks- Performed by helper: Don/doff right sock Shoes - Performed by patient: Don/doff left shoe, Fasten left, Fasten right Shoes - Performed by helper: Don/doff right shoe TED Hose - Performed by helper: Don/doff right TED hose, Don/doff left TED hose Assist for footwear: Partial/moderate assist Assist for lower body dressing: Touching or steadying assistance (Pt > 75%)  Function - Toileting Toileting activity did not occur: Safety/medical concerns Toileting steps completed by patient: Performs perineal hygiene, Adjust clothing prior to toileting, Adjust clothing after toileting Toileting steps completed by helper: Adjust clothing prior to toileting, Performs perineal hygiene, Adjust clothing after toileting (per Berkley Harvey, NT) Toileting Assistive Devices: Grab bar or rail Assist level: Touching or steadying assistance (Pt.75%)  Function - Air cabin crew  transfer activity did not  occur: Safety/medical concerns Toilet transfer assistive device: Grab bar Assist level to toilet: Moderate assist (Pt 50 - 74%/lift or lower) Assist level from toilet: Moderate assist (Pt 50 - 74%/lift or lower)  Function - Chair/bed transfer Chair/bed transfer method: Stand pivot Chair/bed transfer assist level: Supervision or verbal cues Chair/bed transfer assistive device: Armrests Chair/bed transfer details: Verbal cues for technique, Verbal cues for sequencing, Verbal cues for precautions/safety  Function - Locomotion: Wheelchair Type: Manual Max wheelchair distance: 120 Assist Level: Supervision or verbal cues Assist Level: Supervision or verbal cues Wheel 150 feet activity did not occur: Safety/medical concerns Turns around,maneuvers to table,bed, and toilet,negotiates 3% grade,maneuvers on rugs and over doorsills: No Function - Locomotion: Ambulation Assistive device: No device Max distance: 200 Assist level: Touching or steadying assistance (Pt > 75%) Assist level: Touching or steadying assistance (Pt > 75%) Walk 50 feet with 2 turns activity did not occur: Safety/medical concerns Assist level: Touching or steadying assistance (Pt > 75%) Walk 150 feet activity did not occur: Safety/medical concerns Assist level: Touching or steadying assistance (Pt > 75%) Walk 10 feet on uneven surfaces activity did not occur: Safety/medical concerns  Function - Comprehension Comprehension: Auditory Comprehension assist level: Follows basic conversation/direction with no assist  Function - Expression Expression: Verbal Expression assist level: Expresses basic needs/ideas: With no assist  Function - Social Interaction Social Interaction assist level: Interacts appropriately 90% of the time - Needs monitoring or encouragement for participation or interaction.  Function - Problem Solving Problem solving assist level: Solves basic problems with no  assist  Function - Memory Memory assist level: More than reasonable amount of time Patient normally able to recall (first 3 days only): Current season, That he or she is in a hospital, Staff names and faces  Medical Problem List and Plan: 1.  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  DVT Prophylaxis/Anticoagulation: Subcutaneous Lovenox. Monitor for any bleeding episodes 3. Pain Management: Tylenol as needed 4. Hyperlipidemia. Lipitor 5. Neuropsych: This patient is capable of making decisions on his own behalf. 6. Skin/Wound Care: Routine skin checks 7. Fluids/Electrolytes/Nutrition: Routine I&O's, creat  wnl 8. HTN: Monitor, lopressor, prn clonidine, would not increase lopressor due to bradycardia, may need to add another agent Filed Vitals:   11/02/15 2057 11/03/15 0500  BP: 170/101 144/99  Pulse: 72 57  Temp:  97.8 F (36.6 C)  Resp:  16   9. Prediabetes:Hgb A1C 5.7 Monitor   LOS (Days) 7 A FACE TO FACE EVALUATION WAS PERFORMED  Brad Tran 11/03/2015, 8:03 AM

## 2015-11-03 NOTE — Progress Notes (Signed)
Speech Language Pathology Daily Session Note  Patient Details  Name: Brad Tran MRN: [id number] Date of Birth: 09/19/1965  Today's Date: 11/03/2015 SLP Individual Time: 1003-1058 SLP Individual Time Calculation (min): 55 min  Short Term Goals: Week 1: SLP Short Term Goal 1 (Week 1): Pt will recall semi-complex daily information with supervision verbal cues for use of external aids.   SLP Short Term Goal 2 (Week 1): Pt will return demonstration of at least 2 safety precautions with supervision verbal cues.   SLP Short Term Goal 3 (Week 1): Pt will recognize and correct errors in the moment during semi-complex functional tasks with supervision verbal cues.   SLP Short Term Goal 4 (Week 1): Pt will utilize intelligibliity strategies to achieve articulatory precision at the conversational level with supervision.   SLP Short Term Goal 5 (Week 1): Pt will complete semi-complex word finding tasks with supervision verbal cues.    Skilled Therapeutic Interventions:  Pt was seen for skilled ST targeting cognitive goals.  Pt impulsively stood up from side of bed upon therapist's arrival without shoes or skid socks donned.  Mod assist verbal and tactile cues needed for safety awareness during transfer.  Pt was able to identify safety concerns in pictures with supervision question cues but continues to demonstrate poor insight into how his current physical limitations will impact his independence and safety in the home environment.  Pt reported feeling that speech therapy was a "waste of time" and that he "isn't worried about safety right now."  [name] was present and reiterated therapist's recommendations to pt.  Both aware of team recommendations for 24/7 supervision at discharge.  All questions were answered to their satisfaction at this time.  Pt left in wheelchair with family present.  Continue per current plan of care.    Function:  Eating Eating                 Cognition Comprehension  Comprehension assist level: Follows basic conversation/direction with no assist  Expression   Expression assist level: Expresses basic needs/ideas: With no assist  Social Interaction Social Interaction assist level: Interacts appropriately 90% of the time - Needs monitoring or encouragement for participation or interaction.  Problem Solving Problem solving assist level: Solves basic 90% of the time/requires cueing < 10% of the time  Memory Memory assist level: Recognizes or recalls 90% of the time/requires cueing < 10% of the time    Pain Pain Assessment Pain Assessment: No/denies pain  Therapy/Group: Individual Therapy  [place], Melanee SpryNicole L 11/03/2015, 12:31 PM

## 2015-11-03 NOTE — Progress Notes (Signed)
Occupational Therapy Weekly Progress Note  Patient Details  Name: Brad Tran MRN: [id number] Date of Birth: 1965-09-15  Beginning of progress report period: October 28, 2015 End of progress report period: November 03, 2015  Today's Date: 11/03/2015 OT Individual Time: 3794-3276 OT Individual Time Calculation (min): 45 min    Patient has met 4 of 4 short term goals.  Pt has progressed from mod A transfers to min A to close CGA from [facility] to seated surface. Standing tolerance has increased in duration and from Min A to supervision overall. Pt demonstrates trace proximal gross motor return for abduction and internal rotation rated 2-/5.   Patient continues to demonstrate the following deficits: safety awareness, awareness of deficit, functional transfers, and therefore will continue to benefit from skilled OT intervention to enhance overall performance with BADL, iADL and Reduce care partner burden.  Patient progressing toward long term goals..  Continue plan of care.  OT Short Term Goals Week 2:  OT Short Term Goal 1 (Week 2): Pt will complete stand step transfers to toilet with LRAD. OT Short Term Goal 2 (Week 2): Pt will incorporate RUE as stablizer during oral hygiene with Min verbal cues.  OT Short Term Goal 3 (Week 2): Pt will verbalize 2 reasons for protective handling precautions for RUE.  OT Short Term Goal 4 (Week 2): Pt will complete tub shower transfer with supervision and min cues for safety.   Skilled Therapeutic Interventions/Progress Updates:      Therapy Documentation Precautions:  Precautions Precautions: Fall Precaution Comments: R hemi Restrictions Weight Bearing Restrictions: No General:   Vital Signs:  Pain: Pain Assessment Pain Assessment: No/denies pain ADL: ADL ADL Comments: see functional navigator Exercises:   Other Treatments:    See Function Navigator for Current Functional Status.   Therapy/Group: Individual Therapy  Dierdre Searles 11/03/2015,  9:30 AM

## 2015-11-03 NOTE — Progress Notes (Signed)
Occupational Therapy Session Note  Patient Details  Name: Brad Tran MRN: [id number] Date of Birth: 10/09/1965  Today's Date: 11/03/2015 OT Individual Time: 4098-11910730-0815 OT Individual Time Calculation (min): 45 min    Short Term Goals: Week 1:  OT Short Term Goal 1 (Week 1): Pt will perform stand step transfers with minA with min cuing consistently to toilet/ BSC OT Short Term Goal 2 (Week 1): Pt will don shirt from folded position with mod A OT Short Term Goal 3 (Week 1): Pt will maintainind standing balance to complete tooth brushing with min A OT Short Term Goal 4 (Week 1): Pt will incorporate right UE in functional bathing / dressing task with min questioning cuing  Skilled Therapeutic Interventions/Progress Updates:    Treatment session with focus on self care skills. Pt received in recliner with QRB fastened. Pt initiated QRB release prior to clinician instruction and persisted despite verbal cues to wait.   Verbal cues given during shower for supportive handling of affected RUE by holding at/below wrist rather than at fingers/thumb. Pt nodded and verbalized agreement.   Pt demonstrates good application of hemi dressing techniques reviewed in previous sessions and demonstrated ability to don R shoe and AFO w/setup assist. However, pt does not consistently demonstrate supportive handling procedures of RUE during activities/tasks. Plan to continue to educate pt on precautions by having pt verbalize them by incorporating into activity.   Pt left in recliner of room with QRB fastened and all needs in reach.   Therapy Documentation Precautions:  Precautions Precautions: Fall Precaution Comments: R hemi Restrictions Weight Bearing Restrictions: No General:   Vital Signs:  Pain: Pain Assessment Pain Assessment: No/denies pain ADL: ADL ADL Comments: see functional navigator  See Function Navigator for Current Functional Status.   Therapy/Group: Individual Therapy  Brad Tran  Arleny Kruger 11/03/2015, 9:23 AM

## 2015-11-04 ENCOUNTER — Inpatient Hospital Stay (HOSPITAL_COMMUNITY): Payer: Self-pay | Admitting: Physical Therapy

## 2015-11-04 ENCOUNTER — Inpatient Hospital Stay (HOSPITAL_COMMUNITY): Payer: Self-pay | Admitting: Speech Pathology

## 2015-11-04 ENCOUNTER — Ambulatory Visit (HOSPITAL_COMMUNITY): Payer: Self-pay | Admitting: Occupational Therapy

## 2015-11-04 ENCOUNTER — Inpatient Hospital Stay (HOSPITAL_COMMUNITY): Payer: Self-pay | Admitting: Occupational Therapy

## 2015-11-04 DIAGNOSIS — M7918 Myalgia, other site: Secondary | ICD-10-CM | POA: Insufficient documentation

## 2015-11-04 DIAGNOSIS — G8191 Hemiplegia, unspecified affecting right dominant side: Secondary | ICD-10-CM | POA: Insufficient documentation

## 2015-11-04 DIAGNOSIS — M797 Fibromyalgia: Secondary | ICD-10-CM

## 2015-11-04 MED ORDER — MUSCLE RUB 10-15 % EX CREA
TOPICAL_CREAM | Freq: Two times a day (BID) | CUTANEOUS | Status: DC | PRN
  Administered 2015-11-04 – 2015-11-06 (×3): via TOPICAL
  Filled 2015-11-04: qty 85

## 2015-11-04 MED ORDER — TROLAMINE SALICYLATE 10 % EX CREA: TOPICAL_CREAM | Freq: Two times a day (BID) | CUTANEOUS | Status: DC | PRN

## 2015-11-04 NOTE — Progress Notes (Signed)
Orthopedic Tech Progress Note Patient Details:  Brad Tran 08/01/1965 [id number]  Patient ID: Brad Tran, male   DOB: 11/08/1965, 50 y.o.   MRN: [id number] Called in advanced brace order; spoke with Brad Tran  Brad Tran 11/04/2015, 9:32 AM

## 2015-11-04 NOTE — Progress Notes (Signed)
Occupational Therapy Session Note  Patient Details  Name: Brad Tran MRN: [id number] Date of Birth: 10/29/1965  Today's Date: 11/04/2015 OT Individual Time: 1130-1200 OT Individual Time Calculation (min): 30 min    Short Term Goals: Week 2:  OT Short Term Goal 1 (Week 2): Pt will complete stand step transfers to toilet at S with LRAD. OT Short Term Goal 2 (Week 2): Pt will incorporate RUE as stablizer during oral hygiene with Min verbal cues.  OT Short Term Goal 3 (Week 2): Pt will verbalize 2 reasons for protective handling precautions for RUE.  OT Short Term Goal 4 (Week 2): Pt will complete tub shower transfer with supervision and min cues for safety.   Skilled Therapeutic Interventions/Progress Updates:   Treatment session with focus on RUE NMR in sidelying and supine.  Pt reports pain in Rt traps, appears due to compensatory movements and increased muscle activation to initiate RUE movements.  Engaged in shoulder flexion/extension and elbow flexion/extension in sidelying with focus on decreased compensatory movements with distraction at shoulder.  Pt able to demonstrate increased elbow flexion/extension in gravity eliminated position with body weight support from therapist.  In supine pt able to elicit shoulder extension to neutral.  Educated pt on typical recovery pattern and improved posture during activities to decrease compensatory movements.  Therapy Documentation Precautions:  Precautions Precautions: Fall Precaution Comments: R hemi Restrictions Weight Bearing Restrictions: No General:   Vital Signs: Therapy Vitals Temp: 97.8 F (36.6 C) Temp Source: Oral Pulse Rate: 72 Resp: 18 BP: (!) 138/94 mmHg Patient Position (if appropriate): Sitting Oxygen Therapy SpO2: 98 % O2 Device: Not Delivered Pain:  Pt reports pain in traps, RN applied muscle rub ADL: ADL ADL Comments: see functional navigator  See Function Navigator for Current Functional  Status.   Therapy/Group: Individual Therapy  Rosalio LoudHOXIE, [name] 11/04/2015, 4:14 PM

## 2015-11-04 NOTE — Progress Notes (Signed)
Occupational Therapy Session Note  Patient Details  Name: Brad BrooksChhoun Storts MRN: [id number] Date of Birth: 12/28/1965  Today's Date: 11/04/2015 OT Individual Time: 1450-1505 OT Individual Time Calculation (min): 15 min  and Today's Date: 11/04/2015 OT Co-Treatment Time: 1420-1450 (co-tx with SLP 1350-1450) OT Co-Treatment Time Calculation (min): 30 min   Short Term Goals: Week 2:  OT Short Term Goal 1 (Week 2): Pt will complete stand step transfers to toilet at S with LRAD. OT Short Term Goal 2 (Week 2): Pt will incorporate RUE as stablizer during oral hygiene with Min verbal cues.  OT Short Term Goal 3 (Week 2): Pt will verbalize 2 reasons for protective handling precautions for RUE.  OT Short Term Goal 4 (Week 2): Pt will complete tub shower transfer with supervision and min cues for safety.   Skilled Therapeutic Interventions/Progress Updates:    Co-treat with SLP with focus on safety awareness in kitchen task.  Pt has demonstrated decreased safety awareness and awareness of deficits during therapy sessions reporting that it is not a large concern to him and that it will work itself out.  Engaged in familiar cooking task in standing involving stove top, oven, and cutting vegetables to increase awareness of deficits and safety awareness.  Pt demonstrating good hemi-technique with opening and pouring items, however required mod multimodal cues for safe placement of RUE during standing tasks and mobility.  Educated on use of adaptive equipment and strategies to increase safety.  Pt almost bumped knife when transferring items from cutting board to plate, requiring cues to attend to both RUE as well as safety issues (large knife resting on counter in path).  Pt able to verbalize increased awareness of deficits, however still requires mod cues for safety and positioning of RUE and RLE.  [name] present throughout session and able to provide cues intermittently and reports pt appears to demonstrate  increased awareness after task specific tasks.  Reiterated recovery process and progress towards goals with pt and daughter verbalizing understanding.  Therapy Documentation Precautions:  Precautions Precautions: Fall Precaution Comments: R hemi Restrictions Weight Bearing Restrictions: No General:   Vital Signs: Therapy Vitals Temp: 97.8 F (36.6 C) Temp Source: Oral Pulse Rate: 72 Resp: 18 BP: (!) 138/94 mmHg Patient Position (if appropriate): Sitting Oxygen Therapy SpO2: 98 % O2 Device: Not Delivered Pain:  Pt with no c/o pain ADL: ADL ADL Comments: see functional navigator  See Function Navigator for Current Functional Status.   Therapy/Group: Individual Therapy and Co-Treatment  Dwon Sky, [facility] 11/04/2015, 3:13 PM

## 2015-11-04 NOTE — Progress Notes (Signed)
Physical Therapy Weekly Progress Note  Patient Details  Name: Brad Tran MRN: [id number] Date of Birth: February 25, 1966  Beginning of progress report period: October 28, 2015 End of progress report period: November 04, 2015  Today's Date: 11/04/2015 PT Individual Time: 0800-0900 PT Individual Time Calculation (min): 60 min   Patient has met 4 of 4 short term goals.  Pt requires min guard for all mobility tasks due to RLE strength/motor control deficits, dynamic standing postural impairments as well as impulsivity with all activities. Requires consistent mod cueing to slow speed of activities for safety to maintain balance and prevent fall. Will likely need AD for ambulation at d/c to reduce risk of falls and slow pt during performance of mobility activities for improved attention to task and safety awareness.  Patient continues to demonstrate the following deficits:  decreased activity tolerance, balance, postural control, ability to compensate for deficits, functional use of  right upper extremity and right lower extremity, attention, awareness, coordination and knowledge of precautions and therefore will continue to benefit from skilled PT intervention to enhance overall performance with bed mobility, transfers, gait, stairs, home and community access.  Patient progressing toward long term goals.  Continue plan of care.  PT Short Term Goals Week 1:  PT Short Term Goal 1 (Week 1): Pt will perform functional transfers with steady A.  PT Short Term Goal 1 - Progress (Week 1): Met PT Short Term Goal 2 (Week 1): Pt will ambulate 75 ft with LRAD & Min A. PT Short Term Goal 2 - Progress (Week 1): Met PT Short Term Goal 3 (Week 1): Pt will negotiate 12 steps with B rails & min A. PT Short Term Goal 3 - Progress (Week 1): Met PT Short Term Goal 4 (Week 1): Pt will demonstrate steady A standing balance with LRAD. PT Short Term Goal 4 - Progress (Week 1): Met Week 2:  PT Short Term Goal 1 (Week 2): =LTG d/t  ELOS  Skilled Therapeutic Interventions/Progress Updates:    Pt received seated in w/c with handoff from NT after pt used the restroom; denies pain and agreeable to treatment. Pt dons TEDs and B shoes including RLE AFO with S and increased time, demonstrates appropriate problem solving to determine best method to don AFO. Standing at sink to brush teeth performed with S, pt using hemi compensatory techniques to manage items and apply toothpaste. Gait to gym x150' with min guard, facilitation at R glutes for stance control, and min cueing for L weight shift to improve RLE foot clearance. Standing alternating multidirectional stepping/weight shifting to numbered targets including forward/sideways/backwards stepping to improve reactionary balance and stepping strategy for carryover into functional gait. Tall kneeling walks around mat table for proximal hip stability, minA for progressing RLE into abduction d/t hip flexion compensatory pattern. Four square step test practiced with yardsticks to improve hip abduction, SLS balance, RLE coordination; requires variable min/modA for dynamic balance and has difficulty clearing RLE over yardsticks when stepping to L side. Bridging on stability ball x10 reps for glute strengthening. Gait to return to room with min guard, facilitation at glutes. Remained seated in recliner with quick release belt intact and all needs in reach.   Therapy Documentation Precautions:  Precautions Precautions: Fall Precaution Comments: R hemi Restrictions Weight Bearing Restrictions: No Pain: Pain Assessment Pain Assessment: No/denies pain   See Function Navigator for Current Functional Status.  Therapy/Group: Individual Therapy  Brad Tran 11/04/2015, 8:56 AM

## 2015-11-04 NOTE — Progress Notes (Signed)
Speech Language Pathology Daily Session Note  Patient Details  Name: Brad Tran MRN: [id number] Date of Birth: 03/08/1966  Today's Date: 11/04/2015 SLP Co-Treatment Time: 1350-1420 SLP Co-Treatment Time Calculation (min): 30 min  Short Term Goals: Week 1: SLP Short Term Goal 1 (Week 1): Pt will recall semi-complex daily information with supervision verbal cues for use of external aids.   SLP Short Term Goal 2 (Week 1): Pt will return demonstration of at least 2 safety precautions with supervision verbal cues.   SLP Short Term Goal 3 (Week 1): Pt will recognize and correct errors in the moment during semi-complex functional tasks with supervision verbal cues.   SLP Short Term Goal 4 (Week 1): Pt will utilize intelligibliity strategies to achieve articulatory precision at the conversational level with supervision.   SLP Short Term Goal 5 (Week 1): Pt will complete semi-complex word finding tasks with supervision verbal cues.    Skilled Therapeutic Interventions:  Pt was seen for ST/OT co-treatment targeting safety awareness during a familiar kitchen task.  Pt required mod assist verbal cues for safety awareness due to impulsivity and decreased attention to right upper extremity  while cutting vegetables, working at the stove top, and putting items into the oven.  Discussed safety techniques with pt and pt's daughter, emphasizing that pt needs to have someone with him while working in the kitchen.  Pt was left with OT for individual treatment session.  Continue per current plan of care.    Function:  Eating Eating               Cognition Comprehension Comprehension assist level: Follows basic conversation/direction with no assist  Expression   Expression assist level: Expresses basic needs/ideas: With no assist  Social Interaction Social Interaction assist level: Interacts appropriately 90% of the time - Needs monitoring or encouragement for participation or interaction.  Problem  Solving Problem solving assist level: Solves basic 75 - 89% of the time/requires cueing 10 - 24% of the time  Memory Memory assist level: Recognizes or recalls 90% of the time/requires cueing < 10% of the time    Pain Pain Assessment Pain Assessment: No/denies pain  Therapy/Group: Individual Therapy  Brad Tran, Melanee SpryNicole L 11/04/2015, 4:40 PM

## 2015-11-04 NOTE — Progress Notes (Signed)
Speech Language Pathology Daily Session Note  Patient Details  Name: Brad Tran MRN: [id number] Date of Birth: 05/11/1965  Today's Date: 11/04/2015 SLP Individual Time: [id number] SLP Individual Time Calculation (min): 30 min  Short Term Goals: Week 1: SLP Short Term Goal 1 (Week 1): Pt will recall semi-complex daily information with supervision verbal cues for use of external aids.   SLP Short Term Goal 2 (Week 1): Pt will return demonstration of at least 2 safety precautions with supervision verbal cues.   SLP Short Term Goal 3 (Week 1): Pt will recognize and correct errors in the moment during semi-complex functional tasks with supervision verbal cues.   SLP Short Term Goal 4 (Week 1): Pt will utilize intelligibliity strategies to achieve articulatory precision at the conversational level with supervision.   SLP Short Term Goal 5 (Week 1): Pt will complete semi-complex word finding tasks with supervision verbal cues.    Skilled Therapeutic Interventions: Skilled treatment session focused on cognitive goals. SLP facilitated session by providing Max A question cues for intellectual awareness in regards to goals for skilled SLP intervention. Patient independently recalled 2/3 medications and participated in basic medication management task with Mod I. Recommend organizing pill box at next session. Patient transferred to recliner at end of session and required Mod A verbal cues due to impulsivity. Patient left with family present and quick release belt in place. Continue with current plan of care.    Function:  Cognition Comprehension Comprehension assist level: Follows basic conversation/direction with no assist  Expression   Expression assist level: Expresses basic needs/ideas: With no assist  Social Interaction Social Interaction assist level: Interacts appropriately 90% of the time - Needs monitoring or encouragement for participation or interaction.  Problem Solving Problem solving assist  level: Solves basic 75 - 89% of the time/requires cueing 10 - 24% of the time  Memory Memory assist level: Recognizes or recalls 90% of the time/requires cueing < 10% of the time    Pain Pain Assessment Pain Assessment: No/denies pain  Therapy/Group: Individual Therapy  Tameria Patti 11/04/2015, 5:29 PM

## 2015-11-04 NOTE — Progress Notes (Signed)
Subjective/Complaints: C/o Right shoulder pain , no trauma or prior hx.  ROS- denies pain or breathing issues  Objective: Vital Signs: Blood pressure 144/98, pulse 94, temperature 98.3 F (36.8 C), temperature source Oral, resp. rate 20, height '5\' 6"'$  (1.676 m), weight 68.13 kg (150 lb 3.2 oz), SpO2 99 %. No results found. Results for orders placed or performed during the hospital encounter of 10/27/15 (from the past 72 hour(s))  Creatinine, serum     Status: None   Collection Time: 11/03/15  5:49 AM  Result Value Ref Range   Creatinine, Ser 1.00 0.61 - 1.24 mg/dL   GFR calc non Af Amer >60 >60 mL/min   GFR calc Af Amer >60 >60 mL/min    Comment: (NOTE) The eGFR has been calculated using the CKD EPI equation. This calculation has not been validated in all clinical situations. eGFR's persistently <60 mL/min signify possible Chronic Kidney Disease.      HEENT: normal Cardio: RRR and no murmur Resp: CTA B/L and unlabored GI: BS positive and NT, ND Extremity:  Pulses positive and No Edema Skin:   Intact Neuro: Alert/Oriented, Normal Sensory and Abnormal Motor 0/5 in RUE, 3 R HF, KE, 0/5 R ankle DF/PF Musc/Skel:  Other no pain with UE or LE ROM, tenderness to palpation Right trap Gen NAD   Assessment/Plan: 1. 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 Physiatrist is providing close team supervision and 24 hour management of active medical problems listed below. Physiatrist and rehab team continue to assess barriers to discharge/monitor patient progress toward functional and medical goals. FIM: Function - Bathing Position: Shower Body parts bathed by patient: Right arm, Chest, Abdomen, Front perineal area, Buttocks, Right upper leg, Right lower leg, Left upper leg Body parts bathed by helper: Left arm Bathing  not applicable: Back, Left arm Assist Level: Supervision or verbal cues  Function- Upper Body Dressing/Undressing What is the patient wearing?: Pull over shirt/dress Pull over shirt/dress - Perfomed by patient: Thread/unthread right sleeve, Thread/unthread left sleeve, Put head through opening, Pull shirt over trunk Pull over shirt/dress - Perfomed by helper: Thread/unthread right sleeve Assist Level: Supervision or verbal cues Function - Lower Body Dressing/Undressing What is the patient wearing?: Underwear, Pants, Maryln Manuel, Shoes Position: Education officer, museum at [employer] - Performed by patient: Thread/unthread right underwear leg, Thread/unthread left underwear leg, Pull underwear up/down Underwear - Performed by helper: Thread/unthread right underwear leg Pants- Performed by patient: Thread/unthread right pants leg, Thread/unthread left pants leg, Pull pants up/down Pants- Performed by helper: Thread/unthread right pants leg Non-skid slipper socks- Performed by patient: Don/doff left sock Non-skid slipper socks- Performed by helper: Don/doff right sock Shoes - Performed by patient: Don/doff left shoe, Fasten left, Fasten right Shoes - Performed by helper: Don/doff right shoe TED Hose - Performed by helper: Don/doff right TED hose, Don/doff left TED hose Assist for footwear: Setup Assist for lower body dressing: Supervision or verbal cues  Function - Toileting Toileting activity did not occur: Safety/medical concerns Toileting steps completed by patient: Performs perineal hygiene, Adjust clothing prior to toileting, Adjust clothing after toileting Toileting steps completed by helper: Adjust clothing prior to toileting, Performs perineal hygiene, Adjust clothing after toileting Toileting Assistive Devices: Grab bar or rail Assist level: Touching or steadying assistance (Pt.75%)  Function - Air cabin crew transfer activity did not occur: Safety/medical concerns Toilet  transfer assistive device:  Grab bar Assist level to toilet: Moderate assist (Pt 50 - 74%/lift or lower) Assist level from toilet: Moderate assist (Pt 50 - 74%/lift or lower)  Function - Chair/bed transfer Chair/bed transfer method: Stand pivot Chair/bed transfer assist level: Touching or steadying assistance (Pt > 75%) Chair/bed transfer assistive device: Armrests Chair/bed transfer details: Verbal cues for precautions/safety  Function - Locomotion: Wheelchair Type: Manual Max wheelchair distance: 120 Assist Level: Supervision or verbal cues Assist Level: Supervision or verbal cues Wheel 150 feet activity did not occur: Safety/medical concerns Turns around,maneuvers to table,bed, and toilet,negotiates 3% grade,maneuvers on rugs and over doorsills: No Function - Locomotion: Ambulation Assistive device: No device, Orthosis Max distance: 160 Assist level: Touching or steadying assistance (Pt > 75%) Assist level: Touching or steadying assistance (Pt > 75%) Walk 50 feet with 2 turns activity did not occur: Safety/medical concerns Assist level: Touching or steadying assistance (Pt > 75%) Walk 150 feet activity did not occur: Safety/medical concerns Assist level: Touching or steadying assistance (Pt > 75%) Walk 10 feet on uneven surfaces activity did not occur: Safety/medical concerns  Function - Comprehension Comprehension: Auditory Comprehension assist level: Follows basic conversation/direction with no assist  Function - Expression Expression: Verbal Expression assist level: Expresses basic needs/ideas: With no assist  Function - Social Interaction Social Interaction assist level: Interacts appropriately 90% of the time - Needs monitoring or encouragement for participation or interaction.  Function - Problem Solving Problem solving assist level: Solves basic 90% of the time/requires cueing < 10% of the time  Function - Memory Memory assist level: Recognizes or recalls 90% of  the time/requires cueing < 10% of the time Patient normally able to recall (first 3 days only): Current season, That he or she is in a hospital, Staff names and faces  Medical Problem List and Plan: 1.  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 Formulized individual treatment plan based on medical history, underlying problem and comorbidities. 2.  DVT Prophylaxis/Anticoagulation: Subcutaneous Lovenox. Monitor for any bleeding episodes 3. Pain Management: Tylenol as needed, add sportscreme for right trap 4. Hyperlipidemia. Lipitor 5. Neuropsych: This patient is capable of making decisions on his own behalf. 6. Skin/Wound Care: Routine skin checks 7. Fluids/Electrolytes/Nutrition: Routine I&O's, creat  wnl 8. HTN: Monitor, lopressor, prn clonidine, would not increase lopressor due to bradycardia, may need to add another agent Filed Vitals:   11/04/15 0505 11/04/15 0911  BP: 134/89 144/98  Pulse: 75 94  Temp: 98.3 F (36.8 C)   Resp: 20    9. Prediabetes:Hgb A1C 5.7 Monitor   LOS (Days) 8 A FACE TO FACE EVALUATION WAS PERFORMED  Brad Tran E 11/04/2015, 9:14 AM

## 2015-11-05 ENCOUNTER — Inpatient Hospital Stay (HOSPITAL_COMMUNITY): Payer: Self-pay | Admitting: Speech Pathology

## 2015-11-05 ENCOUNTER — Inpatient Hospital Stay (HOSPITAL_COMMUNITY): Payer: Self-pay | Admitting: Occupational Therapy

## 2015-11-05 ENCOUNTER — Inpatient Hospital Stay (HOSPITAL_COMMUNITY): Payer: Self-pay | Admitting: Physical Therapy

## 2015-11-05 NOTE — Progress Notes (Signed)
Orthopedic Tech Progress Note Patient Details:  Donita BrooksChhoun Bonsall 05/16/1965 [id number]  Patient ID: Donita BrooksChhoun Back, male   DOB: 09/28/1965, 50 y.o.   MRN: [id number]   Saul FordyceJennifer C Edris Friedt 11/05/2015, 8:28 AMCalled [facility] for right AFO.

## 2015-11-05 NOTE — Progress Notes (Signed)
 Subjective/Complaints: Right shoulder pain slightly  ROS- denies pain or breathing issues  Objective: Vital Signs: Blood pressure 129/76, pulse 68, temperature 98.3 F (36.8 C), temperature source Oral, resp. rate 18, height 5' 6" (1.676 m), weight 76.431 kg (168 lb 8 oz), SpO2 99 %. No results found. Results for orders placed or performed during the hospital encounter of 10/27/15 (from the past 72 hour(s))  Creatinine, serum     Status: None   Collection Time: 11/03/15  5:49 AM  Result Value Ref Range   Creatinine, Ser 1.00 0.61 - 1.24 mg/dL   GFR calc non Af Amer >60 >60 mL/min   GFR calc Af Amer >60 >60 mL/min    Comment: (NOTE) The eGFR has been calculated using the CKD EPI equation. This calculation has not been validated in all clinical situations. eGFR's persistently <60 mL/min signify possible Chronic Kidney Disease.      HEENT: normal Cardio: RRR and no murmur Resp: CTA B/L and unlabored GI: BS positive and NT, ND Extremity:  Pulses positive and No Edema Skin:   Intact Neuro: Alert/Oriented, Normal Sensory and Abnormal Motor 0/5 in RUE, 3 R HF, KE, 0/5 R ankle DF/PF Musc/Skel:  Other no pain with UE or LE ROM, tenderness to palpation Right trap Gen NAD   Assessment/Plan: 1. 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 Physiatrist is providing close team supervision and 24 hour management of active medical problems listed below. Physiatrist and rehab team continue to assess barriers to discharge/monitor patient progress toward functional and medical goals. FIM: Function - Bathing Position: Shower Body parts bathed by patient: Right arm, Chest, Abdomen, Front perineal area, Buttocks, Right upper leg, Right lower leg, Left upper leg Body parts bathed by helper: Left arm Bathing not applicable: Back,  Left arm Assist Level: Supervision or verbal cues  Function- Upper Body Dressing/Undressing What is the patient wearing?: Pull over shirt/dress Pull over shirt/dress - Perfomed by patient: Thread/unthread right sleeve, Thread/unthread left sleeve, Put head through opening, Pull shirt over trunk Pull over shirt/dress - Perfomed by helper: Thread/unthread right sleeve Assist Level: Supervision or verbal cues Function - Lower Body Dressing/Undressing What is the patient wearing?: Underwear, Pants, Ted Hose, Shoes Position: Wheelchair/chair at sink Underwear - Performed by patient: Thread/unthread right underwear leg, Thread/unthread left underwear leg, Pull underwear up/down Underwear - Performed by helper: Thread/unthread right underwear leg Pants- Performed by patient: Thread/unthread right pants leg, Thread/unthread left pants leg, Pull pants up/down Pants- Performed by helper: Thread/unthread right pants leg Non-skid slipper socks- Performed by patient: Don/doff left sock Non-skid slipper socks- Performed by helper: Don/doff right sock Shoes - Performed by patient: Don/doff left shoe, Fasten left, Fasten right Shoes - Performed by helper: Don/doff right shoe TED Hose - Performed by helper: Don/doff right TED hose, Don/doff left TED hose Assist for footwear: Setup Assist for lower body dressing: Supervision or verbal cues  Function - Toileting Toileting activity did not occur: Safety/medical concerns Toileting steps completed by patient: Performs perineal hygiene, Adjust clothing prior to toileting, Adjust clothing after toileting Toileting steps completed by helper: Adjust clothing prior to toileting, Performs perineal hygiene, Adjust clothing after toileting Toileting Assistive Devices: Grab bar or rail Assist level: Touching or steadying assistance (Pt.75%)  Function - Toilet Transfers Toilet transfer activity did not occur: Safety/medical concerns Toilet transfer assistive device:  Grab bar Assist level to toilet:   Moderate assist (Pt 50 - 74%/lift or lower) Assist level from toilet: Moderate assist (Pt 50 - 74%/lift or lower)  Function - Chair/bed transfer Chair/bed transfer method: Stand pivot Chair/bed transfer assist level: Touching or steadying assistance (Pt > 75%) Chair/bed transfer assistive device: Armrests Chair/bed transfer details: Verbal cues for precautions/safety  Function - Locomotion: Wheelchair Type: Manual Max wheelchair distance: 120 Assist Level: Supervision or verbal cues Assist Level: Supervision or verbal cues Wheel 150 feet activity did not occur: Safety/medical concerns Turns around,maneuvers to table,bed, and toilet,negotiates 3% grade,maneuvers on rugs and over doorsills: No Function - Locomotion: Ambulation Assistive device: No device, Orthosis Max distance: 160 Assist level: Touching or steadying assistance (Pt > 75%) Assist level: Touching or steadying assistance (Pt > 75%) Walk 50 feet with 2 turns activity did not occur: Safety/medical concerns Assist level: Touching or steadying assistance (Pt > 75%) Walk 150 feet activity did not occur: Safety/medical concerns Assist level: Touching or steadying assistance (Pt > 75%) Walk 10 feet on uneven surfaces activity did not occur: Safety/medical concerns  Function - Comprehension Comprehension: Auditory Comprehension assist level: Follows basic conversation/direction with no assist  Function - Expression Expression: Verbal Expression assist level: Expresses basic needs/ideas: With no assist  Function - Social Interaction Social Interaction assist level: Interacts appropriately 90% of the time - Needs monitoring or encouragement for participation or interaction.  Function - Problem Solving Problem solving assist level: Solves basic 75 - 89% of the time/requires cueing 10 - 24% of the time  Function - Memory Memory assist level: Recognizes or recalls 90% of the time/requires  cueing < 10% of the time Patient normally able to recall (first 3 days only): Current season, That he or she is in a hospital, Staff names and faces  Medical Problem List and Plan: 1.  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  DVT Prophylaxis/Anticoagulation: Subcutaneous Lovenox. Monitor for any bleeding episodes 3. Pain Management: Tylenol as needed, added sportscreme for right trap with some improvement 4. Hyperlipidemia. Lipitor 5. Neuropsych: This patient is capable of making decisions on his own behalf. 6. Skin/Wound Care: Routine skin checks 7. Fluids/Electrolytes/Nutrition: Routine I&O's, creat  wnl 8. HTN: Monitor, lopressor, prn clonidine, would not increase lopressor due to bradycardia, may need to add another agent Filed Vitals:   11/04/15 2013 11/05/15 0520  BP: 154/94 129/76  Pulse: 91 68  Temp:  98.3 F (36.8 C)  Resp:  18   9. Prediabetes:Hgb A1C 5.7 Monitor   LOS (Days) 9 A FACE TO FACE EVALUATION WAS PERFORMED  KIRSTEINS,ANDREW E 11/05/2015, 7:47 AM    

## 2015-11-05 NOTE — Progress Notes (Signed)
Speech Language Pathology Weekly Progress and Session Note  Patient Details  Name: Brad Tran MRN: [id number] Date of Birth: [birth year]  Beginning of progress report period:  October 29, 2015   End of progress report period:   November 05, 2015   Today's Date: 11/05/2015 SLP Individual Time: 1420-1500 SLP Individual Time Calculation (min): 40 min  Short Term Goals: Week 1: SLP Short Term Goal 1 (Week 1): Pt will recall semi-complex daily information with supervision verbal cues for use of external aids.   SLP Short Term Goal 1 - Progress (Week 1): Progressing toward goal SLP Short Term Goal 2 (Week 1): Pt will return demonstration of at least 2 safety precautions with supervision verbal cues.   SLP Short Term Goal 2 - Progress (Week 1): Met SLP Short Term Goal 3 (Week 1): Pt will recognize and correct errors in the moment during semi-complex functional tasks with supervision verbal cues.   SLP Short Term Goal 3 - Progress (Week 1): Progressing toward goal SLP Short Term Goal 4 (Week 1): Pt will utilize intelligibliity strategies to achieve articulatory precision at the conversational level with supervision.   SLP Short Term Goal 4 - Progress (Week 1): Met SLP Short Term Goal 5 (Week 1): Pt will complete semi-complex word finding tasks with supervision verbal cues.   SLP Short Term Goal 5 - Progress (Week 1): Discontinued (comment) (not addressed this reporting period to allow focus on cognitive goals, word finding issues have resolved per pt )    New Short Term Goals: Week 2: SLP Short Term Goal 1 (Week 2): Pt will recall semi-complex daily information with supervision verbal cues for use of external aids.   SLP Short Term Goal 2 (Week 2): Pt will recognize and correct safety errors in the moment during semi-complex functional tasks with supervision verbal cues.   SLP Short Term Goal 3 (Week 2): Pt will identify at least 3 changes post CVA and their impact on his independence in the home  environment with supervision question cues.    Weekly Progress Updates:  Pt has made functional gains this reporting period and has met 2 out of 4 targeted short term goals.  Goals for word finding were not addressed this reporting period to allow focus on cognitive goals and are now discontinued due to pt report of resolution of word finding difficulty.  Pt requires min assist for safety awareness during tasks due to impulsivity and decreased emergent awareness of deficits.  Pt would continue to benefit from skilled ST while inpatient in order to maximize functional independence and reduce burden of care prior to discharge home.  Pt and family education is ongoing.  Pt would benefit from skilled ST at next level of care in addition to 24/7 supervision.     Intensity: Minumum of 1-2 x/day, 30 to 90 minutes Frequency: 3 to 5 out of 7 days Duration/Length of Stay: 16-20 days  Treatment/Interventions: Cognitive remediation/compensation;Cueing hierarchy;Environmental controls;Functional tasks;Internal/external aids;Patient/family education   Daily Session  Skilled Therapeutic Interventions: Pt was seen for skilled ST targeting cognitive goals.  SLP facilitated the session with a medication management task targeting recall of new information and functional problem solving.  Pt required min assist to recall function of medications when named but was able to organize pills into a pill box with mod I for 100% accuracy following initial instruction.  Pt was provided with free pill box as he reported that he was agreeable to using aid at home to help recall meds.  SLP also discussed the importance of compliance with medications in preventing stroke recurrence as well as sequelae of stroke and realistic prognostic expectations for recovery given progress made thus far.    All questions were answered to pt's and daughter's satisfaction at this time.  Pt was returned to room and transferred back to recliner from  wheelchair with supervision verbal cues for use of safety precautions.  Goals updated on this date to reflect current progress and plan of care.        Function:   Eating Eating                 Cognition Comprehension Comprehension assist level: Follows basic conversation/direction with no assist  Expression   Expression assist level: Expresses basic needs/ideas: With no assist  Social Interaction Social Interaction assist level: Interacts appropriately 90% of the time - Needs monitoring or encouragement for participation or interaction.  Problem Solving Problem solving assist level: Solves basic 75 - 89% of the time/requires cueing 10 - 24% of the time  Memory Memory assist level: Recognizes or recalls 90% of the time/requires cueing < 10% of the time   General    Pain Pain Assessment Pain Assessment: No/denies pain  Therapy/Group: Individual Therapy  Rafe Mackowski, Selinda Orion 11/05/2015, 4:11 PM

## 2015-11-05 NOTE — Progress Notes (Signed)
Occupational Therapy Session Note  Patient Details  Name: Brad Tran MRN: [id number] Date of Birth: 06/17/1965  Today's Date: 11/05/2015 OT Individual Time: 1050-1130 OT Individual Time Calculation (min): 40 min    Short Term Goals: Week 2:  OT Short Term Goal 1 (Week 2): Pt will complete stand step transfers to toilet at S with LRAD. OT Short Term Goal 2 (Week 2): Pt will incorporate RUE as stablizer during oral hygiene with Min verbal cues.  OT Short Term Goal 3 (Week 2): Pt will verbalize 2 reasons for protective handling precautions for RUE.  OT Short Term Goal 4 (Week 2): Pt will complete tub shower transfer with supervision and min cues for safety.   Skilled Therapeutic Interventions/Progress Updates:    See below for pt pain rating of RUE around second head of trapezius.    Treatment session with focus on self care skills, safety awareness, NMR, and motor control retraining. Pt received in recliner of room with QRB fastened. No verbal cues required for pt to leave QRB fastened when clinician entered room. Pt fitted with GivMohr sling to support RUE.   Sit to stand transfer from recliner <> WC with Min A for safety and Min verbal cues for sequencing and RLE placement. [facility] with verbal cues for RLE placement. Pt completed showering with one verbal cue for safe handling precautions for RUE. Pt demonstrated carryover with improved and safe handling of RLE during showering tasks.   Transitioned to therapy gym for NMR and motor control retraining. Pt engaged in AAROM in all planes of movement in gravity assist/eliminated planes with clinician assist post pt initiation of movement. Scapular mobilization initiated with pt in sidelying and again at seated for improved support during occupational performance and reduced risk of complications in shoulder girdle. Pt verbalized understanding of education provided regarding condition and precautions.   Left pt in  recliner of room with QRB fastened and all needs in reach.   Therapy Documentation Precautions:  Precautions Precautions: Fall Precaution Comments: R hemi Restrictions Weight Bearing Restrictions: No General:   Vital Signs: Therapy Vitals Pulse Rate: 97 BP: (!) 158/92 mmHg Pain:   Pt c/o pain in R shoulder at second head of trapezius rated 4/10 and manageable, per pt. Pt states pain due to activity yesterday rated 8/10 and abated overnight. No intervention required at this time.  ADL: ADL ADL Comments: see functional navigator Exercises:   Other Treatments:    See Function Navigator for Current Functional Status.   Therapy/Group: Individual Therapy  Brad Tran 11/05/2015, 11:37 AM

## 2015-11-05 NOTE — Progress Notes (Addendum)
Occupational Therapy Session Note  Patient Details  Name: Brad Tran MRN: [id number] Date of Birth: 09/02/1965  Today's Date: 11/05/2015 OT Individual Time: 1050-1130 OT Individual Time Calculation (min): 40 min    Short Term Goals: Week 1:  OT Short Term Goal 1 (Week 1): Pt will perform stand step transfers with minA with min cuing consistently to toilet/ BSC OT Short Term Goal 1 - Progress (Week 1): Met OT Short Term Goal 2 (Week 1): Pt will don shirt from folded position with mod A OT Short Term Goal 2 - Progress (Week 1): Met OT Short Term Goal 3 (Week 1): Pt will maintainind standing balance to complete tooth brushing with min A OT Short Term Goal 3 - Progress (Week 1): Met OT Short Term Goal 4 (Week 1): Pt will incorporate right UE in functional bathing / dressing task with min questioning cuing OT Short Term Goal 4 - Progress (Week 1): Met   Week 2:  OT Short Term Goal 1 (Week 2): Pt will complete stand step transfers to toilet at S with LRAD. OT Short Term Goal 2 (Week 2): Pt will incorporate RUE as stablizer during oral hygiene with Min verbal cues.  OT Short Term Goal 3 (Week 2): Pt will verbalize 2 reasons for protective handling precautions for RUE.  OT Short Term Goal 4 (Week 2): Pt will complete tub shower transfer with supervision and min cues for safety.   Skilled Therapeutic Interventions/Progress Updates:  Patient found seated in recliner with no complaints of pain. Patient donned bilateral TEDs with min assist and bilateral shoes with AFO to right shoe with min assist. Pt then transferred recliner to w/c with min assist. Pt propelled self from room to therapy gym. Once in therapy gym, pt transferred w/c to edge of mat to supine. Pt engaged in self AAROM exercises of shoulder flexion/extension, shoulder protraction, and shoulder elevation. Pt then laid on right side and engaged in AAROM, gravity eliminated using table; shoulder flexion/extension, shoulder protraction, and  elbow flexion/extension. Pt then sat edge of mat and engaged in shoulder elevation, forearm supination/pronation, wrist flexion/extension. Each exercise completed X10. Pt then transferred back to w/c and propelled self back to room. Pt transferred back to recliner and therapist left pt seated in recliner with all needs within reach and quick release belt donned.   Therapy Documentation Precautions:  Precautions Precautions: Fall Precaution Comments: R hemi Restrictions Weight Bearing Restrictions: No  Vital Signs: Therapy Vitals Temp: 98.3 F (36.8 C) Temp Source: Oral Pulse Rate: 68 Resp: 18 BP: 129/76 mmHg Patient Position (if appropriate): Lying Oxygen Therapy SpO2: 99 % O2 Device: Not Delivered  See Function Navigator for Current Functional Status.  Therapy/Group: Individual Therapy  Patricia Clay , MS, OTR/L, CLT  11/05/2015, 11:33 AM  

## 2015-11-05 NOTE — Progress Notes (Signed)
Physical Therapy Session Note  Patient Details  Name: Brad Tran MRN: [id number] Date of Birth: 01/13/1966  Today's Date: 11/05/2015 PT Individual Time: 1300-1400 PT Individual Time Calculation (min): 60 min   Short Term Goals: Week 2:  PT Short Term Goal 1 (Week 2): =LTG d/t ELOS  Skilled Therapeutic Interventions/Progress Updates:    Pt received seated in recliner, denies pain and agreeable to treatment. Pt dons TEDs and shoes BLE without assist, increased time and S for seated balance on edge of recliner. Gait in hall side steps R/L with min guard, however one major LOB when side stepping to R side, requiring hall rail and totalA from therapist to regain balance. Gait to gym with no AD and min guard, occasional cues for slowing down and increasing weight shift to L side. Gait with large base quad cane x175' with min guard; improved L weight shift and RLE foot clearance. Obstacle course x3 trials with quad cane and min guard/S, third trial with cognitive dual task of counting out loud up by 3's with noted slower speed and increased instability. Stairs 1x12 6" with L handrail and min guard while performing "up good, down bad". Second trial x12 stairs with opposite gait pattern for RLE strengthening. Standing kinetron x3 trials for 60-90 sec per trial for RLE strengthening and motor control, cues for hip control to maintain RLE in midline. Gait to return to room with quad cane and standbyA. Remained seated in recliner with daughter present and all needs in reach; no quick release belt donned with family present and pt/daughter verbalize understanding of having staff present prior to transfer.   Therapy Documentation Precautions:  Precautions Precautions: Fall Precaution Comments: R hemi Restrictions Weight Bearing Restrictions: No Pain: Pain Assessment Pain Assessment: No/denies pain   See Function Navigator for Current Functional Status.   Therapy/Group: Individual Therapy  Vista Lawmanlizabeth  J Tygielski 11/05/2015, 2:39 PM

## 2015-11-06 ENCOUNTER — Inpatient Hospital Stay (HOSPITAL_COMMUNITY): Payer: Self-pay | Admitting: Speech Pathology

## 2015-11-06 ENCOUNTER — Inpatient Hospital Stay (HOSPITAL_COMMUNITY): Payer: Self-pay | Admitting: Occupational Therapy

## 2015-11-06 ENCOUNTER — Inpatient Hospital Stay (HOSPITAL_COMMUNITY): Payer: Self-pay | Admitting: Physical Therapy

## 2015-11-06 NOTE — Progress Notes (Signed)
Occupational Therapy Note  Patient Details  Name: Brad Tran MRN: [id number] Date of Birth: 07/24/1965  Today's Date: 11/06/2015 OT Individual Time: 1030-1100 OT Individual Time Calculation (min): 30 min   1:1 NMR  Pt ambulated with AFO and quad cane down the gym with min A with GivMohr sling for  UE support. In gym focus on AAROM in supine, sidelying and sitting in all planes and with/ without gravity promoting normal patterns of movement.  Pt with increasing more initiation and movement in shoulder, bicep, triceps  today in all three positions. Pt now with more active ability to turn on tone in right UE during PNF exercises and able to achieve higher ROM with minimal support.  Pt still with no wrist, finger or supination/ pronation detected in his session. Pt ambulated back to room with active ability to position and hold right UE at belly button during 25% of ambulation back to his room. Returned to recliner with safety belt and call bell in place.   No pain noted in session  Roney MansSmith, [name] 11/06/2015, 11:56 AM

## 2015-11-06 NOTE — Progress Notes (Signed)
Social Work Patient ID: Brad Tran, male   DOB: 1965/06/01, 50 y.o.   MRN: [id number]   CSW met with pt and then later with his dtr to update them on team conference.  Pt's dtr is returning to [place] for a week and will resume care of pt on 11-17-15.  She plans for her aunt and uncle to be with him at d/c until she returns.  CSW will f/u with her and assist with ordering pt's DME and arranging home health f/u.  Pt is pleased to learn that he is going to be d/c'd soon.  CSW will continue to follow and assist as needed.

## 2015-11-06 NOTE — Progress Notes (Signed)
Subjective/Complaints: Shoulder pain improved   ROS- denies pain or breathing issues  Objective: Vital Signs: Blood pressure 122/76, pulse 75, temperature 98.3 F (36.8 C), temperature source Oral, resp. rate 20, height  (1.676 m), weight 76.431 kg (168 lb 8 oz), SpO2 100 %. No results found. No results found for this or any previous visit (from the past 72 hour(s)).   HEENT: normal Cardio: RRR and no murmur Resp: CTA B/L and unlabored GI: BS positive and NT, ND Extremity:  Pulses positive and No Edema Skin:   Intact Neuro: Alert/Oriented, Normal Sensory and Abnormal Motor 2- shoulder protraction and retraction, 3 R HF, KE, 0/5 R ankle DF/PF Musc/Skel:  Other no pain with UE or LE ROM, tenderness to palpation Right trap Gen NAD   Assessment/Plan: 1. 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 Physiatrist is providing close team supervision and 24 hour management of active medical problems listed below. Physiatrist and rehab team continue to assess barriers to discharge/monitor patient progress toward functional and medical goals. FIM: Function - Bathing Position: Shower Body parts bathed by patient: Right arm, Chest, Abdomen, Front perineal area, Buttocks, Right upper leg, Left upper leg, Right lower leg, Left lower leg, Back Body parts bathed by helper: Left arm Bathing not applicable: Left arm Assist Level: Supervision or verbal cues  Function- Upper Body Dressing/Undressing What is the patient wearing?: Pull over shirt/dress Pull over shirt/dress - Perfomed by patient: Thread/unthread right sleeve, Thread/unthread left sleeve, Put head through opening, Pull shirt over trunk Pull over shirt/dress - Perfomed by helper: Thread/unthread right sleeve Assist Level: Supervision or verbal cues Function -  Lower Body Dressing/Undressing What is the patient wearing?: Underwear, Pants, Shoes, [employer], AFO Position: Wheelchair/chair at [employer] - Performed by patient: Thread/unthread right underwear leg, Thread/unthread left underwear leg, Pull underwear up/down Underwear - Performed by helper: Thread/unthread right underwear leg Pants- Performed by patient: Thread/unthread right pants leg, Thread/unthread left pants leg, Pull pants up/down Pants- Performed by helper: Thread/unthread right pants leg Non-skid slipper socks- Performed by patient: Don/doff left sock Non-skid slipper socks- Performed by helper: Don/doff right sock Shoes - Performed by patient: Don/doff right shoe, Don/doff left shoe, Fasten right, Fasten left Shoes - Performed by helper: Don/doff right shoe AFO - Performed by patient: Don/doff right AFO TED Hose - Performed by helper: Don/doff right TED hose, Don/doff left TED hose Assist for footwear: Setup Assist for lower body dressing: Supervision or verbal cues  Function - Toileting Toileting activity did not occur: Safety/medical concerns Toileting steps completed by patient: Performs perineal hygiene, Adjust clothing prior to toileting, Adjust clothing after toileting Toileting steps completed by helper: Adjust clothing prior to toileting, Performs perineal hygiene, Adjust clothing after toileting Toileting Assistive Devices: Grab bar or rail Assist level: Touching or steadying assistance (Pt.75%)  Function - Archivist transfer activity did not occur: Safety/medical concerns Toilet transfer assistive device: Grab bar Assist level to toilet: Moderate assist (Pt 50 - 74%/lift or lower) Assist level from toilet: Moderate assist (Pt 50 - 74%/lift or lower)  Function - Chair/bed transfer Chair/bed transfer method: Stand pivot Chair/bed transfer assist level: Touching or steadying assistance (Pt > 75%) Chair/bed transfer assistive device:  Armrests Chair/bed transfer details: Verbal cues for precautions/safety, Verbal cues for technique, Verbal cues for sequencing  Function - Locomotion: Wheelchair Type: Manual Max wheelchair distance: 120 Assist  Level: Supervision or verbal cues Assist Level: Supervision or verbal cues Wheel 150 feet activity did not occur: Safety/medical concerns Turns around,maneuvers to table,bed, and toilet,negotiates 3% grade,maneuvers on rugs and over doorsills: No Function - Locomotion: Ambulation Assistive device: Orthosis, Cane-quad Max distance: 200 Assist level: Supervision or verbal cues Assist level: Supervision or verbal cues Walk 50 feet with 2 turns activity did not occur: Safety/medical concerns Assist level: Supervision or verbal cues Walk 150 feet activity did not occur: Safety/medical concerns Assist level: Touching or steadying assistance (Pt > 75%) Walk 10 feet on uneven surfaces activity did not occur: Safety/medical concerns  Function - Comprehension Comprehension: Auditory Comprehension assist level: Follows basic conversation/direction with no assist  Function - Expression Expression: Verbal Expression assist level: Expresses basic needs/ideas: With no assist  Function - Social Interaction Social Interaction assist level: Interacts appropriately 90% of the time - Needs monitoring or encouragement for participation or interaction.  Function - Problem Solving Problem solving assist level: Solves basic 75 - 89% of the time/requires cueing 10 - 24% of the time  Function - Memory Memory assist level: Recognizes or recalls 90% of the time/requires cueing < 10% of the time Patient normally able to recall (first 3 days only): Current season, That he or she is in a hospital, Staff names and faces  Medical Problem List and Plan: 1.  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  DVT Prophylaxis/Anticoagulation:  Subcutaneous Lovenox. Monitor for any bleeding episodes 3. Pain Management: Tylenol as needed, added sportscreme for right trap with some improvement 4. Hyperlipidemia. Lipitor 5. Neuropsych: This patient is capable of making decisions on his own behalf. 6. Skin/Wound Care: Routine skin checks 7. Fluids/Electrolytes/Nutrition: Routine I&O's, creat  wnl 8. HTN: Monitor, lopressor, prn clonidine, would not increase lopressor due to bradycardia, may need to add another agent Filed Vitals:   11/05/15 2100 11/06/15 0544  BP: 136/98 122/76  Pulse: 83 75  Temp:  98.3 F (36.8 C)  Resp:  20   9. Prediabetes:Hgb A1C 5.7 Monitor   LOS (Days) 10 A FACE TO FACE EVALUATION WAS PERFORMED  KIRSTEINS,ANDREW E 11/06/2015, 8:00 AM

## 2015-11-06 NOTE — Progress Notes (Signed)
Speech Language Pathology Daily Session Note  Patient Details  Name: Brad Tran MRN: [id number] Date of Birth: 09/02/1965  Today's Date: 11/06/2015 SLP Individual Time: 1330-1400 SLP Individual Time Calculation (min): 30 min  Short Term Goals: Week 2: SLP Short Term Goal 1 (Week 2): Pt will recall semi-complex daily information with supervision verbal cues for use of external aids.   SLP Short Term Goal 2 (Week 2): Pt will recognize and correct safety errors in the moment during semi-complex functional tasks with supervision verbal cues.   SLP Short Term Goal 3 (Week 2): Pt will identify at least 3 changes post CVA and their impact on his independence in the home environment with supervision question cues.    Skilled Therapeutic Interventions: Skilled treatment session focused on cognition goals. Pt was attempting to put on TED hose on his right foot when SLP entered the room. Pt was sitting in his recliner with safety belt unfastened. SLP requested the pt wait for her assistance and as this writer bent down to help him with his socks, he reached own again to pull up his socks. As he did, he appeared to lose his balance and slid out of the chair with SLP in front of him, assisting him to the floor. Nursing entered room and assessed pt with no injuries noted. SLP further facilitated session by providing Mod A verbal cues for identifying at least 3 changes post CVA and their impact on pt's safety. Education provided to pt's daughter regarding pt's witnessed, assisted fall. Pt returned to room, left upright in recliner, safety pbelt in place and all needs within reach. Continue current plan of care.   Function:    Cognition Comprehension Comprehension assist level: Understands complex 90% of the time/cues 10% of the time  Expression   Expression assist level: Expresses complex 90% of the time/cues < 10% of the time  Social Interaction Social Interaction assist level: Interacts appropriately 90%  of the time - Needs monitoring or encouragement for participation or interaction.  Problem Solving Problem solving assist level: Solves basic 75 - 89% of the time/requires cueing 10 - 24% of the time  Memory Memory assist level: Recognizes or recalls 90% of the time/requires cueing < 10% of the time    Pain Pain Assessment Pain Assessment: No/denies pain Pain Score: 0-No pain  Therapy/Group: Individual Therapy  Khaliq Turay 11/06/2015, 4:36 PM

## 2015-11-06 NOTE — Progress Notes (Signed)
Occupational Therapy Session Note  Patient Details  Name: Brad Tran MRN: [id number] Date of Birth: 06-14-1965  Today's Date: 11/06/2015 OT Individual Time: 0930-1015 OT Individual Time Calculation (min): 45 min    Short Term Goals: Week 1:  OT Short Term Goal 1 (Week 1): Pt will perform stand step transfers with minA with min cuing consistently to toilet/ BSC OT Short Term Goal 1 - Progress (Week 1): Met OT Short Term Goal 2 (Week 1): Pt will don shirt from folded position with mod A OT Short Term Goal 2 - Progress (Week 1): Met OT Short Term Goal 3 (Week 1): Pt will maintainind standing balance to complete tooth brushing with min A OT Short Term Goal 3 - Progress (Week 1): Met OT Short Term Goal 4 (Week 1): Pt will incorporate right UE in functional bathing / dressing task with min questioning cuing OT Short Term Goal 4 - Progress (Week 1): Met Week 2:  OT Short Term Goal 1 (Week 2): Pt will complete stand step transfers to toilet at S with [place]. OT Short Term Goal 2 (Week 2): Pt will incorporate RUE as stablizer during oral hygiene with Min verbal cues.  OT Short Term Goal 3 (Week 2): Pt will verbalize 2 reasons for protective handling precautions for RUE.  OT Short Term Goal 4 (Week 2): Pt will complete tub shower transfer with supervision and min cues for safety.   Skilled Therapeutic Interventions/Progress Updates:    Pt seen for skilled OT to facilitate adaptive techniques, dynamic balance, and integration of RUE with ADLs during shower and dressing. Pt used quad cane to ambulate around room with close S to occasional touching A to gather clothing.  Once in shower, pt used long bath sponge to wash upper LUE and cloth on thigh to wash forearm and hand. During dressing, he needed a verbal cue to fully pull shirt over R elbow prior to over his head.  Good balance technique in dressing with occasional cues to broaden BOS.  Pt then worked on [employer] with weight bearing activities of mini  tricep pushups on sink and squats with R hand pushing up off the window ledge. A/AROM of sh flexion and extension with hand on handle of cane. Pt in room with all needs met, quick release belt on. Therapy Documentation Precautions:  Precautions Precautions: Fall Precaution Comments: R hemi Restrictions Weight Bearing Restrictions: No   Pain: Pain Assessment Pain Assessment: No/denies pain Pain Score: 0-No pain ADL: ADL ADL Comments: see functional navigator  See Function Navigator for Current Functional Status.   Therapy/Group: Individual Therapy  SAGUIER,JULIA 11/06/2015, 12:18 PM

## 2015-11-06 NOTE — Progress Notes (Signed)
Physical Therapy Session Note  Patient Details  Name: Brad Tran MRN: [id number] Date of Birth: 12/01/1965  Today's Date: 11/06/2015 PT Individual Time: 0800-0900 and 1445-1530 PT Individual Time Calculation (min): 60 min and 45 min (total 105)   Short Term Goals: Week 2:  PT Short Term Goal 1 (Week 2): =LTG d/t ELOS  Skilled Therapeutic Interventions/Progress Updates:    Tx 1: Pt received seated in recliner, denies pain and agreeable to treatment. Pt dons BLE TEDs, shoes and RLE AFO with mod I and increased time. Standing at sink, pt brushes teeth with S for standing balance. Gait to gym with quad cane and close S. Quadruped LUE reaching for RUE weight bearing and core stability, therapist assisted with stabilizing/facilitating RUE. Gait over obstacle course with cones and no AD, min guard. Gait in hall without AD and head turns R/L, up/down, min guard and slower speed when cued for faster head turns with occasional mild LOB with improving stepping/righting strategies. Side steps with light LUE support on rail while performing active hip abduction with bosu ball for resistance; performed for RLE glute med activation, stance control. Returned to room with gait using quad cane and S. Safety plan changed to recommend ambulation into bathroom with quad cane and AFO; NT informed of change. Pt remained seated in recliner with quick release belt intact and all needs in reach.   Tx 2: Pt received asleep in recliner, easily awoken and agreeable to treatment. Gait to gym with quad cane and S overall; note poor management of RLE, absence of consistent heel strike and poor foot placement and one mod LOB requiring modA to recover balance; pt contributes gait abnormalities and LOB to having just woken up from nap. Discussed with pt and daughter that when he is fatigued or just waking up, like in the middle of the night to use the restroom, he needs to be especially cautious and may require extra assist during those  times. Nustep x8 min with BLE level 7 for strengthening and aerobic endurance. Standing balance on foam with ball hits for dynamic standing balance. Rocker board oriented medial/lateral for weight shifting, weight bearing through RLE. Tall kneeling walks around table for hip stability and abduction strength for carryover into gait. Half kneeling with LLE up for R hip control while therapist applied stabilizing reversals throughout trunk. Returned to room with gait, quad cane and S. Remained seated in recliner with all needs in reach, daughter present.    Therapy Documentation Precautions:  Precautions Precautions: Fall Precaution Comments: R hemi Restrictions Weight Bearing Restrictions: No Pain: Pain Assessment Pain Assessment: No/denies pain   See Function Navigator for Current Functional Status.   Therapy/Group: Individual Therapy  [name] 11/06/2015, 9:01 AM

## 2015-11-06 NOTE — Progress Notes (Signed)
Patient was with ST in his room trying to fix his stockings, when he slid off the chair. Patient has no injuries, no complains of pain, family and PA notified.

## 2015-11-06 NOTE — Patient Care Conference (Signed)
Inpatient RehabilitationTeam Conference and Plan of Care Update Date: 11/04/2015   Time: 11:05 AM    Patient Name: Brad Tran      Medical Record Number: [id number]  Date of Birth: 01/12/1966 Sex: Male         Room/Bed: 4W18C/4W18C-01 Payor Info: Payor: MEDICAID POTENTIAL / Plan: MEDICAID POTENTIAL / Product Type: *No Product type* /    Admitting Diagnosis: LT CVA  Admit Date/Time:  10/27/2015  4:25 PM Admission Comments: No comment available   Primary Diagnosis:  <principal problem not specified> Principal Problem: <principal problem not specified>  Patient Active Problem List   Diagnosis Date Noted  . Right hemiparesis (HCC)   . Myofascial muscle pain   . 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 Prediabetes   . Stroke (HCC) 10/25/2015  . CVA (cerebral infarction) 10/25/2015  . Stroke (cerebrum) (HCC) 10/25/2015  . Essential hypertension 10/25/2015  . Hyperlipidemia 10/25/2015  . Acute ischemic stroke (HCC)   . Cerebral infarction due to unspecified mechanism   . Dysarthria     Expected Discharge Date: Expected Discharge Date: 11/13/15  Team Members Present: Physician leading conference: Dr. Claudette LawsAndrew Kirsteins Social Worker Present: Staci AcostaJenny Lilyan Prete, LCSW Nurse Present: Carmie EndAngie Joyce, RN PT Present: Alyson ReedyElizabeth Tygielski, PT OT Present: Rosalio LoudSarah Hoxie, OT SLP Present: Jackalyn LombardNicole Page, SLP PPS Coordinator present : Tora DuckMarie Noel, RN, CRRN     Current Status/Progress Goal Weekly Team Focus  Medical   Right shoulder pain  Min return  home with daughter  safety awareness   Bowel/Bladder   cont x2; LBM 7/17  Remain cont x2  Continue toileting pt with appropriate devices   Swallow/Nutrition/ Hydration             ADL's   Frequent cues for supportive handling of RUE, Min A to close [facility] to seated surface, awareness of deficit  Supervision overall   Awareness of deficit, decreased verbal cues, protective handling of affected side     Mobility   S transfers, min guard/minA dynamic standing balance and gait  S overall with LRAD  Safety awareness and impulsivity with mobility, gait training, dynamic standing balance, R side NMR   Communication             Safety/Cognition/ Behavioral Observations  decreased safety awareness and poor awareness of deficits , min assist needed during basic tasks  supervision   awareness of deficits, compliance with safety precautions    Pain   Occasional R shoulder pain  < 3 on 0-10 pain scale  Assess and treat pain as therapy continues   Skin   Scattered bruising  No new breakdown while on rehab  Monitor skin qshift    Rehab Goals Patient on target to meet rehab goals: Yes Rehab Goals Revised: none *See Care Plan and progress notes for long and short-term goals.  Barriers to Discharge: Impulsive with poor awareness    Possible Resolutions to Barriers:  involve other family members    Discharge Planning/Teaching Needs:  Home with daughter who will provide 24/7 assistance when she returns on 11-17-15.  She is checking with her aunt and uncle to see if they can be with pt until she returns.  Teaching is ongoing with family.   Team Discussion:  Pt with shoulder pain and Dr. Wynn BankerKirsteins is trying sports cream and k-pad to see if that helps.  Pt is having return in right lower body and right shoulder.  Pt is feeling more  encouraged.  Pt is currently min assist for safety, as he does not have a lot of awareness of deficits and needs cueing for sequencing for tasks.  Pt's strength and coordination for right lower extremity and PT plans to slow him down with rolling walker.  Pt with some impulsiveness.  Dtr will be leaving for [place] this weekend and not back until 11-17-15.  ST reported that pt has little carryover of safety precautions and therapists will provide family with lots of education.  Pt will need DME and home health PT/OT/ST.  Revisions to Treatment Plan:  none   Continued Need for Acute  Rehabilitation Level of Care: The patient requires daily medical management by a physician with specialized training in physical medicine and rehabilitation for the following conditions: Daily direction of a multidisciplinary physical rehabilitation program to ensure safe treatment while eliciting the highest outcome that is of practical value to the patient.: Yes Daily medical management of patient stability for increased activity during participation in an intensive rehabilitation regime.: Yes Daily analysis of laboratory values and/or radiology reports with any subsequent need for medication adjustment of medical intervention for : Neurological problems  Brad Tran, [place] Deck 11/06/2015, 1:15 AM

## 2015-11-07 ENCOUNTER — Inpatient Hospital Stay (HOSPITAL_COMMUNITY): Payer: Self-pay | Admitting: *Deleted

## 2015-11-07 NOTE — Progress Notes (Signed)
Subjective/Complaints: Shoulder pain improved   ROS- denies pain or breathing issues  Objective: Vital Signs: Blood pressure 134/93, pulse 87, temperature 98.1 F (36.7 C), temperature source Oral, resp. rate 18, height  (1.676 m), weight 76.431 kg (168 lb 8 oz), SpO2 94 %. No results found. No results found for this or any previous visit (from the past 72 hour(s)).   HEENT: normal Cardio: RRR and no murmur Resp: CTA B/L and unlabored GI: BS positive and NT, ND Extremity:  Pulses positive and No Edema Skin:   Intact Neuro: Alert/Oriented, Normal Sensory and Abnormal Motor 2- shoulder protraction and retraction, 3 R HF, KE, 0/5 R ankle DF/PF Musc/Skel:  Other no pain with UE or LE ROM, tenderness to palpation Right trap Gen NAD   Assessment/Plan: 1. 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 Physiatrist is providing close team supervision and 24 hour management of active medical problems listed below. Physiatrist and rehab team continue to assess barriers to discharge/monitor patient progress toward functional and medical goals. FIM: Function - Bathing Position: Shower Body parts bathed by patient: Right arm, Chest, Abdomen, Front perineal area, Buttocks, Right upper leg, Left upper leg, Right lower leg, Left lower leg, Back, Left arm (used long sponge) Body parts bathed by helper: Left arm Bathing not applicable: Left arm Assist Level: Supervision or verbal cues  Function- Upper Body Dressing/Undressing What is the patient wearing?: Pull over shirt/dress Pull over shirt/dress - Perfomed by patient: Thread/unthread right sleeve, Thread/unthread left sleeve, Put head through opening, Pull shirt over trunk Pull over shirt/dress - Perfomed by helper: Thread/unthread right sleeve Assist Level: Supervision or  verbal cues Function - Lower Body Dressing/Undressing What is the patient wearing?: Underwear, Pants, Shoes, [employer], AFO Position: Wheelchair/chair at [employer] - Performed by patient: Thread/unthread right underwear leg, Thread/unthread left underwear leg, Pull underwear up/down Underwear - Performed by helper: Thread/unthread right underwear leg Pants- Performed by patient: Thread/unthread right pants leg, Thread/unthread left pants leg, Pull pants up/down Pants- Performed by helper: Thread/unthread right pants leg Non-skid slipper socks- Performed by patient: Don/doff left sock Non-skid slipper socks- Performed by helper: Don/doff right sock Shoes - Performed by patient: Fasten left, Fasten right, Don/doff left shoe Shoes - Performed by helper: Don/doff right shoe AFO - Performed by patient: Don/doff right AFO AFO - Performed by helper: Don/doff right AFO TED Hose - Performed by helper: Don/doff right TED hose, Don/doff left TED hose Assist for footwear: Setup Assist for lower body dressing: Supervision or verbal cues  Function - Toileting Toileting activity did not occur: Safety/medical concerns Toileting steps completed by patient: Performs perineal hygiene, Adjust clothing prior to toileting, Adjust clothing after toileting Toileting steps completed by helper: Adjust clothing prior to toileting, Performs perineal hygiene, Adjust clothing after toileting Toileting Assistive Devices: Grab bar or rail Assist level: Touching or steadying assistance (Pt.75%)  Function - Archivist transfer activity did not occur: Safety/medical concerns Toilet transfer assistive device: Cane Assist level to toilet: Supervision or verbal cues Assist level from toilet: Supervision or verbal cues  Function - Chair/bed transfer Chair/bed transfer method: Ambulatory Chair/bed transfer assist level: Touching or steadying assistance (Pt > 75%) Chair/bed transfer assistive device:  Cane Chair/bed transfer details: Verbal cues for precautions/safety, Verbal cues for technique, Verbal cues for sequencing  Function - Locomotion: Wheelchair Type: Manual Max wheelchair distance: 120 Assist  Level: Supervision or verbal cues Assist Level: Supervision or verbal cues Wheel 150 feet activity did not occur: Safety/medical concerns Turns around,maneuvers to table,bed, and toilet,negotiates 3% grade,maneuvers on rugs and over doorsills: No Function - Locomotion: Ambulation Assistive device: Orthosis, Cane-quad Max distance: 200 Assist level: Supervision or verbal cues Assist level: Supervision or verbal cues Walk 50 feet with 2 turns activity did not occur: Safety/medical concerns Assist level: Supervision or verbal cues Walk 150 feet activity did not occur: Safety/medical concerns Assist level: Supervision or verbal cues Walk 10 feet on uneven surfaces activity did not occur: Safety/medical concerns  Function - Comprehension Comprehension: Auditory Comprehension assist level: Understands complex 90% of the time/cues 10% of the time  Function - Expression Expression: Verbal Expression assist level: Expresses complex 90% of the time/cues < 10% of the time  Function - Social Interaction Social Interaction assist level: Interacts appropriately 90% of the time - Needs monitoring or encouragement for participation or interaction.  Function - Problem Solving Problem solving assist level: Solves basic 75 - 89% of the time/requires cueing 10 - 24% of the time  Function - Memory Memory assist level: Recognizes or recalls 90% of the time/requires cueing < 10% of the time Patient normally able to recall (first 3 days only): Current season, That he or she is in a hospital, Staff names and faces  Medical Problem List and Plan: 1.  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  DVT  Prophylaxis/Anticoagulation: Subcutaneous Lovenox. Monitor for any bleeding episodes 3. Pain Management: Tylenol as needed, added sportscreme for right trap with some improvement 4. Hyperlipidemia. Lipitor 5. Neuropsych: This patient is capable of making decisions on his own behalf. 6. Skin/Wound Care: Routine skin checks 7. Fluids/Electrolytes/Nutrition: Routine I&O's, creat  wnl 8. HTN: Monitor, lopressor, prn clonidine, would not increase lopressor due to bradycardia, may need to add another agent Filed Vitals:   11/06/15 2018 11/07/15 0504  BP: 136/88 134/93  Pulse: 73 87  Temp:  98.1 F (36.7 C)  Resp:  18   9. Prediabetes:Hgb A1C 5.7 Monitor   LOS (Days) 11 A FACE TO FACE EVALUATION WAS PERFORMED  Cheronda Erck E 11/07/2015, 8:07 AM

## 2015-11-07 NOTE — Progress Notes (Signed)
Physical Therapy Session Note  Patient Details  Name: [name] MRN: [id number] Date of Birth: 1965/11/17  Today's Date: 11/07/2015 PT Individual Time: 1000-1100 PT Individual Time Calculation (min): 60 min     Skilled Therapeutic Interventions/Progress Updates:  Patient in room , sitting in recliner agrees to session. Initiated standing at sink brushing teeth x 5 min , no LOB noted, Sup[ervision level.  Today's intervention was focused on activity tolerance for extended distance walking and navigating different surfaces (carpet/outside),patient was able to walk all the way to [facility] tower and go outside with 2 rest breaks, 1 significant LOB noted with max A to recover. Daughter present and able to actively participate with assisting in negotiating around obstacles and through elevator doors, education provided as she is the primary caregiver. Training in negotiating up and down a regular stair case 12 steps with min A and increased cues for technique and safety.  At the end of session patient returned to room, left in recliner with QR belt on and all needs within reach.   Therapy Documentation Precautions:  Precautions Precautions: Fall Precaution Comments: R hemi Restrictions Weight Bearing Restrictions: No  Pain: Pain Assessment Pain Assessment: No/denies pain    See Function Navigator for Current Functional Status.   Therapy/Group: Individual Therapy  Dorna Mai 11/07/2015, 12:20 PM

## 2015-11-07 NOTE — Progress Notes (Signed)
Patient refuses TED hose.  States,  "they are uncomfortable."  Purpose and importance of TED hose explained. Continues to refuse.

## 2015-11-08 ENCOUNTER — Inpatient Hospital Stay (HOSPITAL_COMMUNITY): Payer: Self-pay | Admitting: Occupational Therapy

## 2015-11-08 NOTE — Progress Notes (Signed)
Subjective/Complaints: Shoulder pain improved Discussed self exercise program  ROS- denies pain or breathing issues  Objective: Vital Signs: Blood pressure 140/84, pulse 87, temperature 98.3 F (36.8 C), temperature source Oral, resp. rate 17, height 5\' 6"  (1.676 m), weight 76.4 kg (168 lb 8 oz), SpO2 98 %. No results found. No results found for this or any previous visit (from the past 72 hour(s)).   HEENT: normal Cardio: RRR and no murmur Resp: CTA B/L and unlabored GI: BS positive and NT, ND Extremity:  Pulses positive and No Edema Skin:   Intact Neuro: Alert/Oriented, Normal Sensory and Abnormal Motor 2- shoulder protraction and retraction, 3 R HF, KE, 0/5 R ankle DF/PF Musc/Skel:  Other no pain with UE or LE ROM, tenderness to palpation Right trap Gen NAD   Assessment/Plan: 1. 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 Physiatrist is providing close team supervision and 24 hour management of active medical problems listed below. Physiatrist and rehab team continue to assess barriers to discharge/monitor patient progress toward functional and medical goals. FIM: Function - Bathing Position: Shower Body parts bathed by patient: Right arm, Chest, Abdomen, Front perineal area, Buttocks, Right upper leg, Left upper leg, Right lower leg, Left lower leg, Back, Left arm (used long sponge) Body parts bathed by helper: Left arm Bathing not applicable: Left arm Assist Level: Supervision or verbal cues  Function- Upper Body Dressing/Undressing What is the patient wearing?: Pull over shirt/dress Pull over shirt/dress - Perfomed by patient: Thread/unthread right sleeve, Thread/unthread left sleeve, Put head through opening, Pull shirt over trunk Pull over shirt/dress - Perfomed by helper: Thread/unthread right  sleeve Assist Level: Supervision or verbal cues Function - Lower Body Dressing/Undressing What is the patient wearing?: Underwear, Pants, Shoes, [employer], AFO Position: Wheelchair/chair at [employer] - Performed by patient: Thread/unthread right underwear leg, Thread/unthread left underwear leg, Pull underwear up/down Underwear - Performed by helper: Thread/unthread right underwear leg Pants- Performed by patient: Thread/unthread right pants leg, Thread/unthread left pants leg, Pull pants up/down Pants- Performed by helper: Thread/unthread right pants leg Non-skid slipper socks- Performed by patient: Don/doff left sock Non-skid slipper socks- Performed by helper: Don/doff right sock Shoes - Performed by patient: Fasten left, Fasten right, Don/doff left shoe Shoes - Performed by helper: Don/doff right shoe AFO - Performed by patient: Don/doff right AFO AFO - Performed by helper: Don/doff right AFO TED Hose - Performed by helper: Don/doff right TED hose, Don/doff left TED hose Assist for footwear: Setup Assist for lower body dressing: Supervision or verbal cues  Function - Toileting Toileting activity did not occur: Safety/medical concerns Toileting steps completed by patient: Performs perineal hygiene, Adjust clothing prior to toileting, Adjust clothing after toileting Toileting steps completed by helper: Adjust clothing prior to toileting, Performs perineal hygiene, Adjust clothing after toileting Toileting Assistive Devices: Grab bar or rail Assist level: Touching or steadying assistance (Pt.75%)  Function - Archivist transfer activity did not occur: Safety/medical concerns Toilet transfer assistive device: Cane Assist level to toilet: Supervision or verbal cues Assist level from toilet: Supervision or verbal cues  Function - Chair/bed transfer Chair/bed transfer method: Ambulatory Chair/bed transfer assist level: Touching or steadying assistance (Pt >  75%) Chair/bed transfer assistive device: Cane Chair/bed transfer details: Verbal cues for precautions/safety, Verbal cues for technique, Verbal cues for sequencing  Function - Locomotion: Wheelchair Type: Manual Max wheelchair  distance: 120 Assist Level: Supervision or verbal cues Assist Level: Supervision or verbal cues Wheel 150 feet activity did not occur: Safety/medical concerns Turns around,maneuvers to table,bed, and toilet,negotiates 3% grade,maneuvers on rugs and over doorsills: No Function - Locomotion: Ambulation Assistive device: Orthosis, Cane-quad Max distance: 200 Assist level: Supervision or verbal cues Assist level: Supervision or verbal cues Walk 50 feet with 2 turns activity did not occur: Safety/medical concerns Assist level: Supervision or verbal cues Walk 150 feet activity did not occur: Safety/medical concerns Assist level: Supervision or verbal cues Walk 10 feet on uneven surfaces activity did not occur: Safety/medical concerns  Function - Comprehension Comprehension: Auditory Comprehension assist level: Understands complex 90% of the time/cues 10% of the time  Function - Expression Expression: Verbal Expression assist level: Expresses complex 90% of the time/cues < 10% of the time  Function - Social Interaction Social Interaction assist level: Interacts appropriately 90% of the time - Needs monitoring or encouragement for participation or interaction.  Function - Problem Solving Problem solving assist level: Solves basic 75 - 89% of the time/requires cueing 10 - 24% of the time  Function - Memory Memory assist level: Recognizes or recalls 90% of the time/requires cueing < 10% of the time Patient normally able to recall (first 3 days only): Current season, That he or she is in a hospital, Staff names and faces  Medical Problem List and Plan: 1.  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  DVT Prophylaxis/Anticoagulation: Subcutaneous Lovenox. Monitor for any bleeding episodes 3. Pain Management: Tylenol as needed, added sportscreme for right trap with some improvement 4. Hyperlipidemia. Lipitor 5. Neuropsych: This patient is capable of making decisions on his own behalf. 6. Skin/Wound Care: Routine skin checks 7. Fluids/Electrolytes/Nutrition: Routine I&O's, creat  wnl 8. HTN: Monitor, lopressor, prn clonidine, would not increase lopressor due to bradycardia, may need to add another agent Vitals:   11/08/15 0000 11/08/15 0625  BP: (!) 148/81 140/84  Pulse: 73 87  Resp: 17 17  Temp: 97.7 F (36.5 C) 98.3 F (36.8 C)   9. Prediabetes:Hgb A1C 5.7 Monitor   LOS (Days) 12 A FACE TO FACE EVALUATION WAS PERFORMED  KIRSTEINS,ANDREW E 11/08/2015, 8:19 AM

## 2015-11-08 NOTE — Progress Notes (Addendum)
Occupational Therapy Session Note  Patient Details  Name: Brad Tran MRN: [id number] Date of Birth: 15-May-1965  Today's Date: 11/08/2015 OT Individual Time:  - 8588-5027   OT Treatment Time: 59 minutes       Short Term Goals: Week 2:  OT Short Term Goal 1 (Week 2): Pt will complete stand step transfers to toilet at S with LRAD. OT Short Term Goal 2 (Week 2): Pt will incorporate RUE as stablizer during oral hygiene with Min verbal cues.  OT Short Term Goal 3 (Week 2): Pt will verbalize 2 reasons for protective handling precautions for RUE.  OT Short Term Goal 4 (Week 2): Pt will complete tub shower transfer with supervision and min cues for safety.   Skilled Therapeutic Interventions/Progress Updates:    Pt was seen for morning session of ADLs. Was agreeable to take shower. Pt ambulated with quad cane and steadying assistance in room with instruction for attending to R ankle. Pt transferred to shower with Min A. UB/LB bathing completed with supervision while pt was seated and laterally leaning for pericare. Pt finished dressing seated in chair at sink. Min A for steadying balance while pulling pants over hips. Pt then completed transfer training in tub room per discussion regarding home setup. Pt completed tub bench transfer in shower room with Min A and difficultly with lifting R LE over lip of tub. BSC modified to simulate home toilet height with pt completing toilet transfer with Min A and quad cane (to low toilet). With practice, pt may not require BSC upon d/c. Pt required mod vcs for proper hand placement and benefits from reinforcement. Pt exhibits signs of impulsivity and required cues to stop and attend to safety throughout tx. At end of session, pt left in recliner with QRB and all needs within reach.  Therapy Documentation Precautions:  Precautions Precautions: Fall Precaution Comments: R hemi Restrictions Weight Bearing Restrictions: No General:   Vital Signs: Therapy  Vitals Pulse Rate: 82 BP: (!) 140/118 Pain: No c/o pain    ADL: ADL ADL Comments: see functional navigator  See Function Navigator for Current Functional Status.   Therapy/Group: Individual Therapy  Jurline Folger A Yamilex Borgwardt 11/08/2015, 10:42 PM

## 2015-11-09 ENCOUNTER — Inpatient Hospital Stay (HOSPITAL_COMMUNITY): Payer: Self-pay | Admitting: Speech Pathology

## 2015-11-09 ENCOUNTER — Inpatient Hospital Stay (HOSPITAL_COMMUNITY): Payer: Self-pay | Admitting: Physical Therapy

## 2015-11-09 ENCOUNTER — Inpatient Hospital Stay (HOSPITAL_COMMUNITY): Payer: Self-pay | Admitting: Occupational Therapy

## 2015-11-09 MED ORDER — METOPROLOL TARTRATE 50 MG PO TABS
50.0000 mg | ORAL_TABLET | Freq: Two times a day (BID) | ORAL | Status: DC
  Administered 2015-11-09 – 2015-11-12 (×6): 50 mg via ORAL
  Filled 2015-11-09 (×6): qty 1

## 2015-11-09 NOTE — Progress Notes (Signed)
Pt's sister was inquiring information about pt.'s desired plan to be d/c tomorrow.RN advised the family member to call social worker tomorrow.Social worker name and telephone number was provided to pt's sister.

## 2015-11-09 NOTE — Progress Notes (Signed)
Subjective/Complaints: Shoulder pain improved Pt would like to go home several days early  ROS- denies pain or breathing issues  Objective: Vital Signs: Blood pressure 137/83, pulse 76, temperature 97.9 F (36.6 C), temperature source Oral, resp. rate 17, height 5\' 6"  (1.676 m), weight 76.4 kg (168 lb 8 oz), SpO2 100 %. No results found. No results found for this or any previous visit (from the past 72 hour(s)).   HEENT: normal Cardio: RRR and no murmur Resp: CTA B/L and unlabored GI: BS positive and NT, ND Extremity:  Pulses positive and No Edema Skin:   Intact Neuro: Alert/Oriented, Normal Sensory and Abnormal Motor 2- shoulder protraction and retraction, 3 R HF, KE, 0/5 R ankle DF/PF Musc/Skel:  Other no pain with UE or LE ROM, tenderness to palpation Right trap Gen NAD   Assessment/Plan: 1. 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 Physiatrist is providing close team supervision and 24 hour management of active medical problems listed below. Physiatrist and rehab team continue to assess barriers to discharge/monitor patient progress toward functional and medical goals. FIM: Function - Bathing Position: Shower Body parts bathed by patient: Right arm, Chest, Abdomen, Front perineal area, Buttocks, Right upper leg, Left upper leg, Right lower leg, Left lower leg, Back Body parts bathed by helper: Left arm Bathing not applicable: Left arm (pt declining to have therapist assist with washing L UE) Assist Level: Supervision or verbal cues  Function- Upper Body Dressing/Undressing What is the patient wearing?: Pull over shirt/dress Pull over shirt/dress - Perfomed by patient: Thread/unthread right sleeve, Thread/unthread left sleeve, Put head through opening, Pull shirt over trunk Pull over shirt/dress - Perfomed  by helper: Thread/unthread right sleeve Assist Level: Supervision or verbal cues Function - Lower Body Dressing/Undressing What is the patient wearing?: Underwear, Pants, Socks, Shoes Position: Wheelchair/chair at [employer] - Performed by patient: Thread/unthread right underwear leg, Thread/unthread left underwear leg, Pull underwear up/down Underwear - Performed by helper: Thread/unthread right underwear leg Pants- Performed by patient: Thread/unthread right pants leg, Thread/unthread left pants leg, Pull pants up/down, Fasten/unfasten pants Pants- Performed by helper: Thread/unthread right pants leg Non-skid slipper socks- Performed by patient: Don/doff left sock Non-skid slipper socks- Performed by helper: Don/doff right sock Socks - Performed by patient: Don/doff right sock, Don/doff left sock Shoes - Performed by patient: Don/doff right shoe, Don/doff left shoe, Fasten right, Fasten left Shoes - Performed by helper: Don/doff right shoe AFO - Performed by patient: Don/doff right AFO AFO - Performed by helper: Don/doff right AFO TED Hose - Performed by helper: Don/doff right TED hose, Don/doff left TED hose Assist for footwear: Supervision/touching assist Assist for lower body dressing: Supervision or verbal cues  Function - Toileting Toileting activity did not occur: Safety/medical concerns Toileting steps completed by patient: Performs perineal hygiene Toileting steps completed by helper: Adjust clothing prior to toileting, Adjust clothing after toileting Toileting Assistive Devices: Grab bar or rail Assist level: Touching or steadying assistance (Pt.75%)  Function - Archivist transfer activity did not occur: Safety/medical concerns Toilet transfer assistive device: Elevated toilet seat/BSC over toilet, Grab bar Assist level to toilet: Touching or steadying assistance (Pt > 75%) Assist level from toilet: Touching or steadying assistance (Pt > 75%)  Function -  Chair/bed transfer Chair/bed transfer method: Ambulatory Chair/bed transfer assist level: Touching or steadying assistance (Pt > 75%) Chair/bed transfer assistive device:  Cane Chair/bed transfer details: Verbal cues for precautions/safety, Verbal cues for technique, Verbal cues for sequencing  Function - Locomotion: Wheelchair Type: Manual Max wheelchair distance: 120 Assist Level: Supervision or verbal cues Assist Level: Supervision or verbal cues Wheel 150 feet activity did not occur: Safety/medical concerns Turns around,maneuvers to table,bed, and toilet,negotiates 3% grade,maneuvers on rugs and over doorsills: No Function - Locomotion: Ambulation Assistive device: Orthosis, Cane-quad Max distance: 200 Assist level: Supervision or verbal cues Assist level: Supervision or verbal cues Walk 50 feet with 2 turns activity did not occur: Safety/medical concerns Assist level: Supervision or verbal cues Walk 150 feet activity did not occur: Safety/medical concerns Assist level: Supervision or verbal cues Walk 10 feet on uneven surfaces activity did not occur: Safety/medical concerns  Function - Comprehension Comprehension: Auditory Comprehension assist level: Follows basic conversation/direction with no assist  Function - Expression Expression: Verbal Expression assist level: Expresses basic needs/ideas: With no assist  Function - Social Interaction Social Interaction assist level: Interacts appropriately 90% of the time - Needs monitoring or encouragement for participation or interaction.  Function - Problem Solving Problem solving assist level: Solves complex 90% of the time/cues < 10% of the time  Function - Memory Memory assist level: Recognizes or recalls 90% of the time/requires cueing < 10% of the time Patient normally able to recall (first 3 days only): Current season  Medical Problem List and Plan: 1.  Right hemiplegia secondary to left basal ganglia corona radiata  infarct- CIR PT, OT 2.  DVT Prophylaxis/Anticoagulation: Subcutaneous Lovenox. Monitor for any bleeding episodes 3. Pain Management: Tylenol as needed, added sportscreme for right trap with some improvement 4. Hyperlipidemia. Lipitor 5. Neuropsych: This patient is capable of making decisions on his own behalf. 6. Skin/Wound Care: Routine skin checks 7. Fluids/Electrolytes/Nutrition: Routine I&O's, creat  wnl 8. HTN: Monitor, lopressor, prn clonidine,occ BP spike , HRs now >75 , may increase lopressor Vitals:   11/08/15 2008 11/09/15 0517  BP: (!) 140/118 137/83  Pulse: 82 76  Resp:  17  Temp:  97.9 F (36.6 C)   9. Prediabetes:Hgb A1C 5.7 Monitor   LOS (Days) 13 A FACE TO FACE EVALUATION WAS PERFORMED  KIRSTEINS,ANDREW E 11/09/2015, 7:58 AM

## 2015-11-09 NOTE — Progress Notes (Signed)
Occupational Therapy Session Note  Patient Details  Name: Brad Tran MRN: [id number] Date of Birth: 31-Aug-1965  Today's Date: 11/09/2015 OT Individual Time: 1102-1202 OT Individual Time Calculation (min): 60 min     Short Term Goals: Week 2:  OT Short Term Goal 1 (Week 2): Pt will complete stand step transfers to toilet at S with LRAD. OT Short Term Goal 2 (Week 2): Pt will incorporate RUE as stablizer during oral hygiene with Min verbal cues.  OT Short Term Goal 3 (Week 2): Pt will verbalize 2 reasons for protective handling precautions for RUE.  OT Short Term Goal 4 (Week 2): Pt will complete tub shower transfer with supervision and min cues for safety.   Skilled Therapeutic Interventions/Progress Updates:    Treatment session with focus on self care skills, functional ambulation, and pt education. Pt verbalized desire to go home as soon as possible and stated plans for sister to come stay at his house. Discussed preparations prior to pt d/c to ensure safety and carryover of skills. Pt still verbalized desire to go home yet agreed with need to remain in hospital.   Pt requested to walk at start of session. Pt ambulated approximately 450 feet with close S to CGA using quad cane. Pt demonstrated distractibility during ambulation with pt nearly tripping due to weight shift from impulsive turning to attend to audio stimuli with Min to Mod A for steadying until balance regained. Discussed safety with pt regarding attentional limitations with pt verbalizing understanding.   Problem solved tub shower transfers with pt which he stated he had covered in a previous therapy session. Clinician asked pt to demonstrate tub shower transfer in ADL apartment bathroom with no other cues given. Pt tried to step over tub ledge and into tub with affected RLE leading. Pt lost balance and stumbled with Mod A correction provided by clinician to regain balance. With legs still straddled on either side of tub ledge, pt  given verbal cues to step out which again resulted in a severe stumble for which clinician provided correction. Clinician demonstrated safe stand to sit transfer with use of tub transfer bench for pt with pt verbalizing then demonstrating understanding. However, pt also stated understanding from previous session in which this was reviewed with little carryover on pt's part due to lack of awareness of deficit.   Continue to address pt issues in awareness of deficits.   Pt left in room with QRB fastened and all needs in reach.   Therapy Documentation Precautions:  Precautions Precautions: Fall Precaution Comments: R hemi Restrictions Weight Bearing Restrictions: No General:   Vital Signs: Therapy Vitals Temp: 98.6 F (37 C) Temp Source: Oral Pulse Rate: (!) 101 Resp: 18 BP: (!) 127/92 Patient Position (if appropriate): Lying Oxygen Therapy SpO2: 98 % O2 Device: Not Delivered Pain:   Pt with no c/o pain this session.   See Function Navigator for Current Functional Status.   Therapy/Group: Individual Therapy  Leafy Kindle 11/09/2015, 2:04 PM

## 2015-11-09 NOTE — Progress Notes (Signed)
Speech Language Pathology Daily Session Note  Patient Details  Name: Brad Tran MRN: [id number] Date of Birth: 1965-06-11  Today's Date: 11/09/2015 SLP Individual Time: 0902-1000 SLP Individual Time Calculation (min): 58 min   Short Term Goals: Week 2: SLP Short Term Goal 1 (Week 2): Pt will recall semi-complex daily information with supervision verbal cues for use of external aids.   SLP Short Term Goal 2 (Week 2): Pt will recognize and correct safety errors in the moment during semi-complex functional tasks with supervision verbal cues.   SLP Short Term Goal 3 (Week 2): Pt will identify at least 3 changes post CVA and their impact on his independence in the home environment with supervision question cues.    Skilled Therapeutic Interventions:   Skilled treatment session focused on addressing cognition goals. SLP facilitated session by providing Mod question cues to identify 3 changes post CVA and Max assist multimodal cues to anticipate impact of these deficits on home management upon discharge.  Patient requested assistance to brush his teeth and required Total assist to recall need to donn TEDs, AFO brace, and shoes prior to getting up with quad cane to walk to sink to brush teeth.  Once at sink patient was Mod I to complete self-care task.  Patient requested to move up his discharge date; SLP communicated this to therapy team and CSW.  Recommend family education with sister prior to discharge.  Continue with current plan of care.   Function:  Cognition Comprehension Comprehension assist level: Follows basic conversation/direction with no assist  Expression   Expression assist level: Expresses basic needs/ideas: With extra time/assistive device  Social Interaction Social Interaction assist level: Interacts appropriately 90% of the time - Needs monitoring or encouragement for participation or interaction.  Problem Solving Problem solving assist level: Solves basic 90% of the time/requires  cueing < 10% of the time  Memory Memory assist level: Recognizes or recalls 90% of the time/requires cueing < 10% of the time    Pain Pain Assessment Pain Assessment: No/denies pain  Therapy/Group: Individual Therapy  Charlane Ferretti., CCC-SLP [phone]  [name] 11/09/2015, 9:53 AM

## 2015-11-09 NOTE — Progress Notes (Signed)
Physical Therapy Session Note  Patient Details  Name: Brad Tran MRN: [id number] Date of Birth: 06-Jul-1965  Today's Date: 11/09/2015 PT Individual Time: 1415-1530 PT Individual Time Calculation (min): 75 min    Short Term Goals: Week 2:  PT Short Term Goal 1 (Week 2): =LTG d/t ELOS  Skilled Therapeutic Interventions/Progress Updates:   Pt received seated in recliner, denies pain and agreeable to treatment. Pt dons TEDs and shoes with RLE AFO with mod I and increased time. Gait to gym with no AD and min guard. Standing tolerance while LLE elevated on 6" step for R weight bearing and stance control, while pt engaged in game at table. Alternating toe taps to 6" step with min guard overall, two LOBs posteriorly requiring pt to sit on mat table to regain balance. Obstacle course with cones for pt to navigate around; min guard overall and pt demonstrating reduced impulsivity, slower speed and intentional foot placement. Continues to demonstrate occasional R knee recurvatum, decreased R stance time d/t hip strength/coordination impairments, and mild R foot drag. Stairs 1x8 on 3" stairs, 1x12 on 6" stairs with RLE ascent for strengthening and coordination. Nustep x8 min total with BLE for strengthening, aerobic endurance; pt with improved hip control during nustep allowing knee to maintain in midline over foot. Gait to return to room with no AD and min guard; one minor LOB while turning to R side. Remained seated in recliner, quick release belt intact and all needs in reach.   Therapy Documentation Precautions:  Precautions Precautions: Fall Precaution Comments: R hemi Restrictions Weight Bearing Restrictions: No Pain: Pain Assessment Pain Assessment: No/denies pain   See Function Navigator for Current Functional Status.   Therapy/Group: Individual Therapy  [name] 11/09/2015, 3:43 PM

## 2015-11-10 ENCOUNTER — Inpatient Hospital Stay (HOSPITAL_COMMUNITY): Payer: Self-pay | Admitting: Physical Therapy

## 2015-11-10 ENCOUNTER — Inpatient Hospital Stay (HOSPITAL_COMMUNITY): Payer: Self-pay | Admitting: Occupational Therapy

## 2015-11-10 ENCOUNTER — Inpatient Hospital Stay (HOSPITAL_COMMUNITY): Payer: Self-pay | Admitting: Speech Pathology

## 2015-11-10 LAB — CREATININE, SERUM
CREATININE: 0.9 mg/dL (ref 0.61–1.24)
GFR calc non Af Amer: >60 mL/min (ref >60)

## 2015-11-10 NOTE — Progress Notes (Signed)
Subjective/Complaints: We discussed progress and intensity of rehab as inpt vs outpt  ROS- denies pain or breathing issues  Objective: Vital Signs: Blood pressure 126/76, pulse 71, temperature 98.4 F (36.9 C), temperature source Oral, resp. rate 16, height _0  (1.676 m), weight 76.4 kg (168 lb 8 oz), SpO2 99 %. No results found. Results for orders placed or performed during the hospital encounter of 10/27/15 (from the past 72 hour(s))  Creatinine, serum     Status: None   Collection Time: 11/10/15  5:32 AM  Result Value Ref Range   Creatinine, Ser 0.90 0.61 - 1.24 mg/dL   GFR calc non Af Amer >60 >60 mL/min   GFR calc Af Amer >60 >60 mL/min    Comment: (NOTE) The eGFR has been calculated using the CKD EPI equation. This calculation has not been validated in all clinical situations. eGFR's persistently <60 mL/min signify possible Chronic Kidney Disease.      HEENT: normal Cardio: RRR and no murmur Resp: CTA B/L and unlabored GI: BS positive and NT, ND Extremity:  Pulses positive and No Edema Skin:   Intact Neuro: Alert/Oriented, Normal Sensory and Abnormal Motor 2- shoulder protraction and retraction, 3 R HF, KE, 0/5 R ankle DF/PF Musc/Skel:  Other no pain with UE or LE ROM, tenderness to palpation Right trap Gen NAD   Assessment/Plan: 1. 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 Physiatrist is providing close team supervision and 24 hour management of active medical problems listed below. Physiatrist and rehab team continue to assess barriers to discharge/monitor patient progress toward functional and medical goals. FIM: Function - Bathing Position: Shower Body parts bathed by patient: Right arm, Chest, Abdomen, Front perineal area, Right upper leg, Left upper leg, Right lower leg, Buttocks, Left lower  leg, Back Body parts bathed by helper: Left arm Bathing not applicable: Left arm Assist Level: Supervision or verbal cues  Function- Upper Body Dressing/Undressing What is the patient wearing?: Pull over shirt/dress Pull over shirt/dress - Perfomed by patient: Thread/unthread right sleeve, Thread/unthread left sleeve, Put head through opening, Pull shirt over trunk Pull over shirt/dress - Perfomed by helper: Thread/unthread right sleeve Assist Level: Supervision or verbal cues Function - Lower Body Dressing/Undressing What is the patient wearing?: Underwear, Pants, Maryln Manuel, Shoes Position: Education officer, museum at [employer] - Performed by patient: Thread/unthread left underwear leg, Pull underwear up/down, Thread/unthread right underwear leg Underwear - Performed by helper: Thread/unthread right underwear leg Pants- Performed by patient: Thread/unthread right pants leg, Thread/unthread left pants leg, Pull pants up/down Pants- Performed by helper: Thread/unthread right pants leg Non-skid slipper socks- Performed by patient: Don/doff left sock Non-skid slipper socks- Performed by helper: Don/doff right sock Socks - Performed by patient: Don/doff right sock, Don/doff left sock Shoes - Performed by patient: Don/doff right shoe, Don/doff left shoe, Fasten right, Fasten left Shoes - Performed by helper: Don/doff right shoe AFO - Performed by patient: Don/doff right AFO AFO - Performed by helper: Don/doff right AFO TED Hose - Performed by patient: Don/doff right TED hose, Don/doff left TED hose TED Hose - Performed by helper: Don/doff right TED hose, Don/doff left TED hose Assist for footwear: Supervision/touching assist Assist for lower body dressing: Supervision or verbal cues  Function - Toileting Toileting activity did not occur: Safety/medical concerns Toileting steps completed by patient: Performs perineal hygiene Toileting steps completed by helper: Adjust clothing  prior to  toileting, Adjust clothing after toileting Toileting Assistive Devices: Grab bar or rail Assist level: Touching or steadying assistance (Pt.75%)  Function - Air cabin crew transfer activity did not occur: Safety/medical concerns Toilet transfer assistive device: Elevated toilet seat/BSC over toilet, Grab bar Assist level to toilet: Moderate assist (Pt 50 - 74%/lift or lower) Assist level from toilet: Moderate assist (Pt 50 - 74%/lift or lower)  Function - Chair/bed transfer Chair/bed transfer method: Ambulatory Chair/bed transfer assist level: Supervision or verbal cues Chair/bed transfer assistive device: Orthosis Chair/bed transfer details: Verbal cues for precautions/safety, Verbal cues for technique, Verbal cues for sequencing  Function - Locomotion: Wheelchair Type: Manual Max wheelchair distance: 120 Assist Level: Supervision or verbal cues Assist Level: Supervision or verbal cues Wheel 150 feet activity did not occur: Safety/medical concerns Turns around,maneuvers to table,bed, and toilet,negotiates 3% grade,maneuvers on rugs and over doorsills: No Function - Locomotion: Ambulation Assistive device: Orthosis Max distance: 200 Assist level: Touching or steadying assistance (Pt > 75%) Assist level: Touching or steadying assistance (Pt > 75%) Walk 50 feet with 2 turns activity did not occur: Safety/medical concerns Assist level: Touching or steadying assistance (Pt > 75%) Walk 150 feet activity did not occur: Safety/medical concerns Assist level: Touching or steadying assistance (Pt > 75%) Walk 10 feet on uneven surfaces activity did not occur: Safety/medical concerns  Function - Comprehension Comprehension: Auditory Comprehension assist level: Follows basic conversation/direction with no assist  Function - Expression Expression: Verbal Expression assist level: Expresses basic needs/ideas: With no assist  Function - Social Interaction Social Interaction assist  level: Interacts appropriately 90% of the time - Needs monitoring or encouragement for participation or interaction.  Function - Problem Solving Problem solving assist level: Solves complex 90% of the time/cues < 10% of the time  Function - Memory Memory assist level: Recognizes or recalls 90% of the time/requires cueing < 10% of the time Patient normally able to recall (first 3 days only): Current season  Medical Problem List and Plan: 1.  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  DVT Prophylaxis/Anticoagulation: Subcutaneous Lovenox. Monitor for any bleeding episodes 3. Pain Management: Tylenol as needed, no further shoulder pain 4. Hyperlipidemia. Lipitor 5. Neuropsych: This patient is capable of making decisions on his own behalf. 6. Skin/Wound Care: Routine skin checks 7. Fluids/Electrolytes/Nutrition: Routine I&O's, creat  wnl 8. HTN: Monitor, lopressor, prn clonidine,occ BP spike , HRs now >75 , may increase lopressor, BPs responding nicely Vitals:   11/09/15 2011 11/10/15 0514  BP: (!) 144/96 126/76  Pulse: 98 71  Resp:  16  Temp:  98.4 F (36.9 C)   9. Prediabetes:Hgb A1C 5.7 Monitor   LOS (Days) 14 A FACE TO FACE EVALUATION WAS PERFORMED  Brad Tran E 11/10/2015, 7:35 AM

## 2015-11-10 NOTE — Progress Notes (Signed)
Occupational Therapy Session Note  Patient Details  Name: Brad Tran MRN: [id number] Date of Birth: 08-Aug-1965  Today's Date: 11/10/2015 OT Individual Time: 3545-6256 OT Individual Time Calculation (min): 61 min     Short Term Goals: Week 2:  OT Short Term Goal 1 (Week 2): Pt will complete stand step transfers to toilet at S with LRAD. OT Short Term Goal 2 (Week 2): Pt will incorporate RUE as stablizer during oral hygiene with Min verbal cues.  OT Short Term Goal 3 (Week 2): Pt will verbalize 2 reasons for protective handling precautions for RUE.  OT Short Term Goal 4 (Week 2): Pt will complete tub shower transfer with supervision and min cues for safety.   Skilled Therapeutic Interventions/Progress Updates:    Treatment session with focus on self care skills and NMR. Pt completed self care tasks at S with pt continuing to require verbal cues for safe handling of RUE and RLE during showering tasks and while donning/doffing clothing. Continue to address immediately in occupational context when observed.   Challenged pt short term memory by having pt demonstrate safe tub shower transfer using tub bench in ADL apartment with no cues provided other than directive to complete task. Pt demonstrated recall from previous session by performing stand to sit, ambulatory transfer using quad cane.   Engaged pt in NMR activity with weight bearing on RUE with clinician assist for stability. Pt instructed to lean over mat, bear weight on RUE while tapping numbered discs on mat with LUE. Increased challenge by having pt avoid use of LUE on mat as stabilizer. Pt verbalized enjoyment of activity.   Clinician provided AAROM with pt RUE at end of session with tapping to facilitate extensor activation. Flexor tone noted with active stretch initiated to counteract. Provided pt education regarding tone inhibition to preserve ROM.   Overall, pt ambulated approximately 475 ft using quad cane over course of session.  Noted R foot drop with increased fatigue from ambulation.   Plan to use e-stim on RUE extensors in follow-up session.   Therapy Documentation Precautions:  Precautions Precautions: Fall Precaution Comments: R hemi Restrictions Weight Bearing Restrictions: No General:   Vital Signs: Therapy Vitals Temp: 99.1 F (37.3 C) Temp Source: Oral Pulse Rate: 79 Resp: 17 BP: 134/89 Patient Position (if appropriate): Lying Oxygen Therapy SpO2: 98 % O2 Device: Not Delivered Pain:   ADL: ADL ADL Comments: see functional navigator Exercises:   Other Treatments:    See Function Navigator for Current Functional Status.   Therapy/Group: Individual Therapy  Leafy Kindle 11/10/2015, 2:54 PM

## 2015-11-10 NOTE — Progress Notes (Signed)
Physical Therapy Session Note  Patient Details  Name: Brad Tran MRN: [id number] Date of Birth: Aug 25, 1965  Today's Date: 11/10/2015 PT Individual Time: 0800-0900 and 9528-4132 PT Individual Time Calculation (min): 60 min and 42 min (total 102 min)    Short Term Goals: Week 2:  PT Short Term Goal 1 (Week 2): =LTG d/t ELOS  Skilled Therapeutic Interventions/Progress Updates:    Tx 1: Pt received seated in recliner, denies pain and agreeable to treatment. Dons B TEDs, shoes and RLE AFO with modI. Ambulated to sink with quad cane and S. Standing at sink, pt performs oral hygiene with mod I. Gait to gym with no AD and close S; inconsistent RLE foot clearance. Heel wedge taped onto AFO to prevent sliding in shoe and allow pt to easily transition between shoes. Floor transfer x2 with min guard first trial, S second trial. Pt educated in safety following a fall, including situations where pt would not want to attempt getting up alone. Assessed Berg with results as below. Patient demonstrates increased fall risk as noted by score of   42/56 on Berg Balance Scale.  (<36= high risk for falls, close to 100%; 37-45 significant >80%; 46-51 moderate >50%; 52-55 lower >25%). Pt educated on risk of significant risk of falls, recommendation that he continue using AD when d/c home to reduce risk of falls. In parallel bars, performed tandem stance, heel-toe walking for RLE hip abduction strengthening. R lateral step ups to 6" step with LUE support; performed for RLE strengthening. Gait to return to room close S, no AD. Minor LOB when turning R out of gym, pt educated regarding repetitive LOB over past several days when turning to R d/t tendency to cross L over R and insufficient coordination to catch balance with RLE, importance of slow turns and purposeful placement of LEs. Remained seated in recliner with quick release belt intact and all needs in reach.    Tx 2: pt received seated in recliner; denies pain and  agreeable to treatment. Gait to/from gym with quad cane and S. Dynamic standing balance on foam while reaching to retrieve cards and then match to board; S for balance with cues to maintain R knee over foot to prevent ankle from rolling and causing LOB. Gait in hall without AD, side steps, forward and backward walking while passing ball with student observer; no LOB and improving coordination with repetition. Able to recover minor LOBs without assist. Stairs 1x12 in gym stairs with L handrail and reciprocal pattern, 1x12 in stairwell with step-to pattern and S. Returned to room with gait as above; remained seated in recliner with quick release belt intact and all needs in reach.   Therapy Documentation Precautions:  Precautions Precautions: Fall Precaution Comments: R hemi Restrictions Weight Bearing Restrictions: No Pain: Pain Assessment Pain Assessment: No/denies pain   Standardized Balance Assessment Standardized Balance Assessment: Berg Balance Test Berg Balance Test Sit to Stand: Able to stand without using hands and stabilize independently Standing Unsupported: Able to stand safely 2 minutes Sitting with Back Unsupported but Feet Supported on Floor or Stool: Able to sit safely and securely 2 minutes Stand to Sit: Controls descent by using hands Transfers: Able to transfer safely, minor use of hands Standing Unsupported with Eyes Closed: Able to stand 10 seconds with supervision Standing Ubsupported with Feet Together: Able to place feet together independently and stand for 1 minute with supervision From Standing, Reach Forward with Outstretched Arm: Reaches forward but needs supervision From Standing Position, Pick up Object  from Floor: Able to pick up shoe, needs supervision From Standing Position, Turn to Look Behind Over each Shoulder: Looks behind one side only/other side shows less weight shift Turn 360 Degrees: Able to turn 360 degrees safely but slowly Standing Unsupported,  Alternately Place Feet on Step/Stool: Able to complete >2 steps/needs minimal assist Standing Unsupported, One Foot in Front: Able to plae foot ahead of the other independently and hold 30 seconds Standing on One Leg: Able to lift leg independently and hold > 10 seconds Total Score: 42   See Function Navigator for Current Functional Status.   Therapy/Group: Individual Therapy  [name] 11/10/2015, 9:10 AM

## 2015-11-10 NOTE — Progress Notes (Signed)
Speech Language Pathology Daily Session Note  Patient Details  Name: Brad Tran MRN: [id number] Date of Birth: 1965-05-19  Today's Date: 11/10/2015 SLP Individual Time: 1535-1600 SLP Individual Time Calculation (min): 25 min   Short Term Goals: Week 2: SLP Short Term Goal 1 (Week 2): Pt will recall semi-complex daily information with supervision verbal cues for use of external aids.   SLP Short Term Goal 2 (Week 2): Pt will recognize and correct safety errors in the moment during semi-complex functional tasks with supervision verbal cues.   SLP Short Term Goal 3 (Week 2): Pt will identify at least 3 changes post CVA and their impact on his independence in the home environment with supervision question cues.    Skilled Therapeutic Interventions:   Skilled treatment session focused on addressing cognition goals. SLP facilitated session with Max assist question cues to recall current deficits, how they impact function, and needed level of assist upon discharge.  Patient able to acknowledge that he needs and will need assist with tub/shower transfers; however, reports that he could do all other tasks.  He required Max assist cues to recall tasks from OT/PT sessions such as cooking, lose of balance with right turns, etc. SLP also facilitated session by requesting patient initiate contact with his sister to tentatively set-up family education with her tomorrow. Continue with current plan of care.   Function:  Cognition Comprehension Comprehension assist level: Follows basic conversation/direction with no assist  Expression   Expression assist level: Expresses basic needs/ideas: With no assist  Social Interaction Social Interaction assist level: Interacts appropriately 90% of the time - Needs monitoring or encouragement for participation or interaction.  Problem Solving Problem solving assist level: Solves basic 90% of the time/requires cueing < 10% of the time  Memory Memory assist level:  Recognizes or recalls 50 - 74% of the time/requires cueing 25 - 49% of the time    Pain Pain Assessment Pain Assessment: No/denies pain  Therapy/Group: Individual Therapy  Charlane Ferretti., CCC-SLP [phone]  Brad Tran 11/10/2015, 4:15 PM

## 2015-11-11 ENCOUNTER — Inpatient Hospital Stay (HOSPITAL_COMMUNITY): Payer: Self-pay | Admitting: Speech Pathology

## 2015-11-11 ENCOUNTER — Inpatient Hospital Stay (HOSPITAL_COMMUNITY): Payer: Self-pay | Admitting: Physical Therapy

## 2015-11-11 ENCOUNTER — Inpatient Hospital Stay (HOSPITAL_COMMUNITY): Payer: Self-pay | Admitting: Occupational Therapy

## 2015-11-11 NOTE — Progress Notes (Signed)
Speech Language Pathology Daily Session Note  Patient Details  Name: Brad Tran MRN: [id number] Date of Birth: December 09, 1965  Today's Date: 11/11/2015 SLP Individual Time: 5208-0223 SLP Individual Time Calculation (min): 45 min   Short Term Goals: Week 2: SLP Short Term Goal 1 (Week 2): Pt will recall semi-complex daily information with supervision verbal cues for use of external aids.   SLP Short Term Goal 2 (Week 2): Pt will recognize and correct safety errors in the moment during semi-complex functional tasks with supervision verbal cues.   SLP Short Term Goal 3 (Week 2): Pt will identify at least 3 changes post CVA and their impact on his independence in the home environment with supervision question cues.    Skilled Therapeutic Interventions:Pt seen for skilled therapy targeting cognitive goals for recalling daily information and safety awareness/deficits following stroke. Pt was able to recall details as related to hospitalization- 100% of meds with min verbal cueing. Pt continues to have limited awareness of the amount of assist he requires for safety. Independently he could only determine that he would need assistance with a shower and required min A of clarifying questions to reveal deficits.    Function:  Eating Eating     Eating Assist Level: No help, No cues           Cognition Comprehension Comprehension assist level: Follows complex conversation/direction with extra time/assistive device  Expression   Expression assist level: Expresses complex ideas: With extra time/assistive device  Social Interaction Social Interaction assist level: Interacts appropriately with others - No medications needed.  Problem Solving Problem solving assist level: Solves basic 90% of the time/requires cueing < 10% of the time  Memory Memory assist level: Recognizes or recalls 90% of the time/requires cueing < 10% of the time    Pain Pain Assessment Pain Assessment: No/denies  pain  Therapy/Group: Individual Therapy  Rocky Crafts MA, CCC-SLP 11/11/2015, 5:20 PM

## 2015-11-11 NOTE — Progress Notes (Signed)
Subjective/Complaints: Pt doing well this am Pt hopes to go home tomorrow  ROS- denies pain or breathing issues  Objective: Vital Signs: Blood pressure 123/73, pulse 82, temperature 98.5 F (36.9 C), temperature source Oral, resp. rate 18, height '5\' 6"'$  (1.676 m), weight 75 kg (165 lb 4.8 oz), SpO2 99 %. No results found. Results for orders placed or performed during the hospital encounter of 10/27/15 (from the past 72 hour(s))  Creatinine, serum     Status: None   Collection Time: 11/10/15  5:32 AM  Result Value Ref Range   Creatinine, Ser 0.90 0.61 - 1.24 mg/dL   GFR calc non Af Amer >60 >60 mL/min   GFR calc Af Amer >60 >60 mL/min    Comment: (NOTE) The eGFR has been calculated using the CKD EPI equation. This calculation has not been validated in all clinical situations. eGFR's persistently <60 mL/min signify possible Chronic Kidney Disease.      HEENT: normal Cardio: RRR and no murmur Resp: CTA B/L and unlabored GI: BS positive and NT, ND Extremity:  Pulses positive and No Edema Skin:   Intact Neuro: Alert/Oriented, Normal Sensory and Abnormal Motor 2- shoulder protraction and retraction, 3 R HF, KE, 0/5 R ankle DF/PF Musc/Skel:  Other no pain with UE or LE ROM, tenderness to palpation Right trap Gen NAD   Assessment/Plan: 1. 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 Physiatrist is providing close team supervision and 24 hour management of active medical problems listed below. Physiatrist and rehab team continue to assess barriers to discharge/monitor patient progress toward functional and medical goals.  Medically ready for d/c in am FIM: Function - Bathing Position: Shower Body parts bathed by patient: Right arm, Chest, Abdomen, Front perineal area, Right upper leg, Left upper leg, Right lower  leg, Buttocks, Left lower leg, Back Body parts bathed by helper: Left arm Bathing not applicable: Left arm Assist Level: Supervision or verbal cues  Function- Upper Body Dressing/Undressing What is the patient wearing?: Pull over shirt/dress Pull over shirt/dress - Perfomed by patient: Thread/unthread right sleeve, Thread/unthread left sleeve, Put head through opening, Pull shirt over trunk Pull over shirt/dress - Perfomed by helper: Thread/unthread right sleeve Assist Level: Supervision or verbal cues Function - Lower Body Dressing/Undressing What is the patient wearing?: Underwear, Pants, [employer], Shoes Position: Sitting EOB Underwear - Performed by patient: Thread/unthread left underwear leg, Pull underwear up/down, Thread/unthread right underwear leg Underwear - Performed by helper: Thread/unthread right underwear leg Pants- Performed by patient: Thread/unthread right pants leg, Thread/unthread left pants leg, Pull pants up/down Pants- Performed by helper: Thread/unthread right pants leg Non-skid slipper socks- Performed by patient: Don/doff left sock Non-skid slipper socks- Performed by helper: Don/doff right sock Socks - Performed by patient: Don/doff right sock, Don/doff left sock Shoes - Performed by patient: Don/doff right shoe, Don/doff left shoe, Fasten right, Fasten left Shoes - Performed by helper: Don/doff right shoe AFO - Performed by patient: Don/doff right AFO AFO - Performed by helper: Don/doff right AFO TED Hose - Performed by patient: Don/doff right TED hose, Don/doff left TED hose TED Hose - Performed by helper: Don/doff right TED hose, Don/doff left TED hose Assist for footwear: Supervision/touching assist Assist for lower body dressing: Supervision or verbal cues  Function - Toileting Toileting activity did not occur: Safety/medical concerns Toileting steps completed by patient: Adjust clothing prior to  toileting, Performs perineal hygiene, Adjust clothing after  toileting Toileting steps completed by helper: Adjust clothing after toileting Toileting Assistive Devices: Grab bar or rail Assist level: Touching or steadying assistance (Pt.75%)  Function - Air cabin crew transfer activity did not occur: Safety/medical concerns Toilet transfer assistive device: Elevated toilet seat/BSC over toilet Assist level to toilet: Moderate assist (Pt 50 - 74%/lift or lower) Assist level from toilet: Moderate assist (Pt 50 - 74%/lift or lower)  Function - Chair/bed transfer Chair/bed transfer method: Ambulatory Chair/bed transfer assist level: Supervision or verbal cues Chair/bed transfer assistive device: Orthosis Chair/bed transfer details: Verbal cues for precautions/safety, Verbal cues for technique, Verbal cues for sequencing  Function - Locomotion: Wheelchair Type: Manual Max wheelchair distance: 120 Assist Level: Supervision or verbal cues Assist Level: Supervision or verbal cues Wheel 150 feet activity did not occur: Safety/medical concerns Turns around,maneuvers to table,bed, and toilet,negotiates 3% grade,maneuvers on rugs and over doorsills: No Function - Locomotion: Ambulation Assistive device: Orthosis Max distance: 200 Assist level: Supervision or verbal cues Assist level: Supervision or verbal cues Walk 50 feet with 2 turns activity did not occur: Safety/medical concerns Assist level: Supervision or verbal cues Walk 150 feet activity did not occur: Safety/medical concerns Assist level: Supervision or verbal cues Walk 10 feet on uneven surfaces activity did not occur: Safety/medical concerns  Function - Comprehension Comprehension: Auditory Comprehension assist level: Follows complex conversation/direction with no assist  Function - Expression Expression: Verbal Expression assist level: Expresses complex ideas: With no assist  Function - Social Interaction Social Interaction assist level: Interacts appropriately with others  - No medications needed.  Function - Problem Solving Problem solving assist level: Solves complex problems: Recognizes & self-corrects  Function - Memory Memory assist level: Complete Independence: No helper Patient normally able to recall (first 3 days only): Current season  Medical Problem List and Plan: 1.  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 Formulized individual treatment plan based on medical history, underlying problem and comorbidities.  2.  DVT Prophylaxis/Anticoagulation: Subcutaneous Lovenox. Monitor for any bleeding episodes 3. Pain Management: Tylenol as needed, no further shoulder pain 4. Hyperlipidemia. Lipitor 5. Neuropsych: This patient is capable of making decisions on his own behalf. 6. Skin/Wound Care: Routine skin checks 7. Fluids/Electrolytes/Nutrition: Routine I&O's, creat  wnl 8. HTN: Monitor, lopressor, prn clonidine,occ BP spike , HRs now >75 , may increase lopressor, BPs responding nicely Vitals:   11/10/15 1926 11/11/15 0552  BP: (!) 151/89 123/73  Pulse: 93 82  Resp:  18  Temp:  98.5 F (36.9 C)   9. Prediabetes:Hgb A1C 5.7 Monitor   LOS (Days) 15 A FACE TO FACE EVALUATION WAS PERFORMED  Alyna Stensland E 11/11/2015, 8:09 AM

## 2015-11-11 NOTE — Progress Notes (Signed)
Physical Therapy Session Note  Patient Details  Name: Brad Tran MRN: [id number] Date of Birth: [birth year]  Today's Date: 11/11/2015 PT Individual Time: 1300-1400 and 1445-1525 PT Individual Time Calculation (min): 60 min and 40 min (total 100 min)    Short Term Goals: Week 2:  PT Short Term Goal 1 (Week 2): =LTG d/t ELOS  Skilled Therapeutic Interventions/Progress Updates:   Tx 1: Pt received seated in recliner, sister present, and agreeable to treatment. Education performed throughout session with sister; educated regarding R inattention, impulsivity, high fall risk and situations which would make pt more likely to lose balance such as cognitive dual task, crowded environments, surface change. Sister performed hands-on assist for ascending/descending steps using quad cane sideways on step to simulate home environment with no rails. Gait indoors/outdoors with quad cane and sister providing min guard; one LOB on surface change from different types of sidewalk; sister able to assist pt with regaining balance. Performed car transfer and bed mobility with S. Nustep x8 min with BLE and RUE for R sided NMR. Returned to room and remained seated in recliner with quick release belt intact at end of session, all needs in reach.   Tx 2: Pt received seated in recliner, denies pain and agreeable to treatment, Gait to/from gym with quad cane and close S. Kinetron x2 trials in standing alternating stepping with light to no LUE support. Prolonged standing on RLE on kinetron with LLE elevated for weight bearing, proprioception, ankle/hip strategy while engaged in LUE task at tabletop. Gait x300' with quad cane while retrieving/relocating items at various heights. Returned to room and remained seated in recliner with quick release belt intact and all needs in reach.   Therapy Documentation Precautions:  Precautions Precautions: Fall Precaution Comments: R hemi Restrictions Weight Bearing Restrictions:  No   See Function Navigator for Current Functional Status.   Therapy/Group: Individual Therapy  [name] 11/11/2015, 2:27 PM

## 2015-11-11 NOTE — Progress Notes (Signed)
Occupational Therapy Session Note  Patient Details  Name: Brad Tran MRN: [id number] Date of Birth: [birth year]  Today's Date: 11/11/2015 OT Individual Time: 1030-1100 and 1130-1200 OT Individual Time Calculation (min): 30 min and 30 min     Short Term Goals: Week 2:  OT Short Term Goal 1 (Week 2): Pt will complete stand step transfers to toilet at S with LRAD. OT Short Term Goal 2 (Week 2): Pt will incorporate RUE as stablizer during oral hygiene with Min verbal cues.  OT Short Term Goal 3 (Week 2): Pt will verbalize 2 reasons for protective handling precautions for RUE.  OT Short Term Goal 4 (Week 2): Pt will complete tub shower transfer with supervision and min cues for safety.   Skilled Therapeutic Interventions/Progress Updates:     1 of 2) Treatment session with focus on self care skills. Pt completed showering tasks at S overall. Verbal cues given to pt regarding problem solving requirement for grab bars as steady assist for showering tasks at standing. Pt stated that grab bar use required due to small size of shower in room. Plan to use ADL apartment shower tomorrow for follow up and to simulate showering at home.   Observed pt to stumble due to inattention to RLE, hitting objects with toe/foot in room x2 and x1 in bathroom. Pt stumbled despite verbal cues provided to redirect pt attention to RLE during ambulation. Pt acknowledged verbal cues without demonstrated carryover or application.   Left pt in room with all needs in reach.   2 of 2) Treatment session with focus on functional ambulation in home / community setting. Pt observed to hit R shoulder on door when leaving room. Pt gave no verbal response when asked what happened and instead, pt gave a slight laugh. Provided education regarding importance of R attention due to concerns for safety as result of R inattention.  Pt ambulated around cones placed on floor in ADL apartment x2. Pt instructed to ambulate with quad cane around  cones and not touch cones with either foot. Verbal cues delivered throughout session to no avail - pt consistently hit/moved cones with R foot. Activity reviewed with pt after first pass with cues for pt to attend to R foot at specific points in room/around cones such as by bed and sofa with slight loss of balance and ability to self correct. Pt continued to get foot stuck or otherwise stalled at same points in room.   Pt left in room with all needs in reach.   Therapy Documentation Precautions:  Precautions Precautions: Fall Precaution Comments: R hemi Restrictions Weight Bearing Restrictions: No General:   Vital Signs:   Pain: Pain Assessment Pain Assessment: No/denies pain ADL: ADL ADL Comments: see functional navigator  See Function Navigator for Current Functional Status.   Therapy/Group: Individual Therapy  Leafy Kindle 11/11/2015, 1:12 PM

## 2015-11-11 NOTE — Progress Notes (Signed)
Social Work Patient ID: Brad Tran, male   DOB: 07-24-1965, 50 y.o.   MRN: [id number]   Met with pt and spoke with sister-Brad Tran to discuss team conference goals supervision level and discharge 7/27, due to pt wanting moved up and sister here for education this afternoon. Discussed home health follow up, equipment, Match program and follow up for PCP. Both agreeable to all and will follow up with. Sister aware pt requires 24 hr supervision, daughter coming back 8/1. Pt very pleased with new plan and going home a day earlier.

## 2015-11-11 NOTE — Patient Care Conference (Signed)
Inpatient RehabilitationTeam Conference and Plan of Care Update Date: 11/11/2015   Time: 11:15 AM    Patient Name: Brad Tran      Medical Record Number: [id number]  Date of Birth: June 18, 1965 Sex: Male         Room/Bed: 4M09C/4M09C-01 Payor Info: Payor: MEDICAID POTENTIAL / Plan: MEDICAID POTENTIAL / Product Type: *No Product type* /    Admitting Diagnosis: LT CVA  Admit Date/Time:  10/27/2015  4:25 PM Admission Comments: No comment available   Primary Diagnosis:  <principal problem not specified> Principal Problem: <principal problem not specified>  Patient Active Problem List   Diagnosis Date Noted  . Right hemiparesis (HCC)   . Myofascial muscle pain   . 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 Prediabetes   . Stroke (HCC) 10/25/2015  . CVA (cerebral infarction) 10/25/2015  . Stroke (cerebrum) (HCC) 10/25/2015  . Essential hypertension 10/25/2015  . Hyperlipidemia 10/25/2015  . Acute ischemic stroke (HCC)   . Cerebral infarction due to unspecified mechanism   . Dysarthria     Expected Discharge Date: Expected Discharge Date: 11/12/15  Team Members Present: Physician leading conference: Dr. [name] Social Worker Present: Dossie Der, LCSW Nurse Present: Carmie End, RN PT Present: Alyson Reedy, PT OT Present: Rosalio Loud, OT;Other (comment) Gailen Shelter) SLP Present: Claudell Kyle, SLP PPS Coordinator present : Tora Duck, RN, CRRN     Current Status/Progress Goal Weekly Team Focus  Medical   Right shoulder pain resolved, increasing right upper extremity strength as well as right lower extremity strength  Maintain medical stability for home discharge, manage blood pressures  Discharge planning   Bowel/Bladder   Contient of Bowel/Bladder. LBM 11/10/15  Patient to remain continent of Bowel/Bladder while in [facility]  Monitor Bowel/Bladder function   Swallow/Nutrition/ Hydration             ADL's   Mod verbal  cues for supportive handling of RUE, close S transfers using AD, awareness of deficit  Supervision overall   Awareness of deficit. functional transfers, protective handling of affected side    Mobility   S transfers, S gait with quad cane, S stairs. R inattention and RLE incoordination contributing to dynamic balance impairments and high fall risk  S overall with LRAD  Safety awareness and impulsivity, pending family education, R side NMR   Communication             Safety/Cognition/ Behavioral Observations            Pain   Denied any pain or discomfort  <3  Assess and treat pain q shift and as needed   Skin   Scattered bruising  No new skin breakdown while in RH  Assess skin q shift and as needed      *See Care Plan and progress notes for long and short-term goals.  Barriers to Discharge: Impulsivity    Possible Resolutions to Barriers:  Educate patient and caregivers    Discharge Planning/Teaching Needs:  Sister to come in and attend therapies due to she and her husband will be taking pt home until daughter returns 8/1. Pt wants to leave sooner      Team Discussion:  Pt wanting to go home earlier, sister coming in for education this afternoon. Can move up a day to tomorrow. Family aware of 24 hr supervision recommendation. Will need PCP and assist with medication. INattention and impulivity an issue and reason for supervision. Needs cues at times.  Revisions to Treatment Plan:  DC tomorrow   Continued Need for Acute Rehabilitation Level of Care: The patient requires daily medical management by a physician with specialized training in physical medicine and rehabilitation for the following conditions: Daily direction of a multidisciplinary physical rehabilitation program to ensure safe treatment while eliciting the highest outcome that is of practical value to the patient.: Yes Daily medical management of patient stability for increased activity during participation in an  intensive rehabilitation regime.: Yes Daily analysis of laboratory values and/or radiology reports with any subsequent need for medication adjustment of medical intervention for : Neurological problems  Evie Croston, Lemar Livings 11/11/2015, 1:39 PM

## 2015-11-12 ENCOUNTER — Inpatient Hospital Stay (HOSPITAL_COMMUNITY): Payer: Self-pay | Admitting: Occupational Therapy

## 2015-11-12 ENCOUNTER — Inpatient Hospital Stay (HOSPITAL_COMMUNITY): Payer: Self-pay | Admitting: Physical Therapy

## 2015-11-12 ENCOUNTER — Inpatient Hospital Stay (HOSPITAL_COMMUNITY): Payer: Self-pay | Admitting: Speech Pathology

## 2015-11-12 MED ORDER — MUSCLE RUB 10-15 % EX CREA: 1.0000 "application " | TOPICAL_CREAM | Freq: Two times a day (BID) | CUTANEOUS | 0 refills | Status: DC | PRN

## 2015-11-12 MED ORDER — ASPIRIN EC 325 MG PO TBEC: 325.0000 mg | DELAYED_RELEASE_TABLET | Freq: Every day | ORAL | 0 refills | Status: DC

## 2015-11-12 MED ORDER — ASPIRIN 325 MG PO TABS: 325.0000 mg | ORAL_TABLET | Freq: Every day | ORAL | 0 refills | Status: DC

## 2015-11-12 MED ORDER — METOPROLOL TARTRATE 50 MG PO TABS: 50.0000 mg | ORAL_TABLET | Freq: Two times a day (BID) | ORAL | 0 refills | Status: DC

## 2015-11-12 MED ORDER — ATORVASTATIN CALCIUM 80 MG PO TABS: 80.0000 mg | ORAL_TABLET | Freq: Every day | ORAL | 0 refills | Status: DC

## 2015-11-12 NOTE — Plan of Care (Signed)
Problem: RH Attention Goal: LTG Patient will demonstrate focused/sustained (OT) LTG:  Patient will demonstrate focused/sustained/selective/alternating/divided attention during functional activities in specific environment with assist for # of minutes  (OT)  Outcome: Not Met (add Reason) Due to R hemi-inattention and required focus for R attention during functional tasks.

## 2015-11-12 NOTE — Progress Notes (Signed)
Occupational Therapy Session Note  Patient Details  Name: Brad Tran MRN: [id number] Date of Birth: 1965-10-14  Today's Date: 11/12/2015 OT Individual Time: 0900-1000 OT Individual Time Calculation (min): 60 min     Short Term Goals: Week 2:  OT Short Term Goal 1 (Week 2): Pt will complete stand step transfers to toilet at S with LRAD. OT Short Term Goal 2 (Week 2): Pt will incorporate RUE as stablizer during oral hygiene with Min verbal cues.  OT Short Term Goal 3 (Week 2): Pt will verbalize 2 reasons for protective handling precautions for RUE.  OT Short Term Goal 4 (Week 2): Pt will complete tub shower transfer with supervision and min cues for safety.   Skilled Therapeutic Interventions/Progress Updates:     Sister Larita Fife) present for duration of session as intermediary caregiver for pt prior to dtr return Aug 1st.   Treatment session with focus on self care skills, overall safety, and HEP. Pt completed shower in tub shower using tub transfer bench in ADL apartment. Pt ambulated to apartment with S and use of quad cane. Pt demonstrated carryover of tub bench transfer when entering and exiting tub using tub bench. Discussed recommendation for non skid strips to be installed in pt home tub with sister. Pt demonstrated proper handling of RUE during hemi dressing tasks which sister observed and noted for carryover and cues, as needed, at home. Also discussed home modifications with pt and sister for improved safety during ambulation such as removal of area rugs and trip hazards, particularly due to yesterday's performance.   Pt demonstrated R inattention by running into clinician on R side while ambulating with quad cane in room, veering R while ambulating in hallway, and by hitting door frame of bathroom with R shoulder while walking out - all of which was observed by pt's sister. Pt also demonstrated impulsivity with sister present as evidenced by dropping quad cane and retrieve items pt  dropped from where they were tucked under RUE. Pt stumbled and recovered.  Provided caregiver ed on hemi-inattention and safety concerns. Sister verbalized understanding and agreement.   Demonstrated HEP to sister and handling precautions for RUE. Sister verbalized understanding and demonstrated learning to clinician. Encouraged follow up care to both sister and pt as means for facilitating recovery overall.   Therapy Documentation Precautions:  Precautions Precautions: Fall Precaution Comments: R hemi Restrictions Weight Bearing Restrictions: No General:   Vital Signs:  Pain:  Pt with no c/o pain.  ADL: ADL ADL Comments: see functional navigator Exercises:   Other Treatments:    See Function Navigator for Current Functional Status.   Therapy/Group: Individual Therapy  Leafy Kindle 11/12/2015, 2:40 PM

## 2015-11-12 NOTE — Progress Notes (Signed)
Social Work  Discharge Note  The overall goal for the admission was met for:   Discharge location: [place] 8/1  Length of Stay: Yes-16 DAYS  Discharge activity level: Yes-SUPERVISION LEVEL  Home/community participation: Yes  Services provided included: MD, RD, PT, OT, SLP, RN, CM, TR, Pharmacy, Neuropsych and SW  Financial Services: Other: PENDING MEDICAID  Follow-up services arranged: Home Health: [facility]PT, OT,RN, SP, SW, DME: [facility] and Patient/Family has no preference for HH/DME agencies  Comments (or additional information):DAUGHTER AND SISTER HAVE BEEN HERE FOR EDUCATION AND AWARE OF PT'S DISCHARGE NEEDS-IE CUEING FOLLOW UP WITH DISABILITY AND MEDICAID APPLICATIONS. PCP SET UP [facility] FOR 8/31 @ 11:00. MATCH PROGRAM GIVEN TO SISTER FOR ASSIST WITH MEDICATIONS.  Patient/Family verbalized understanding of follow-up arrangements: Yes  Individual responsible for coordination of the follow-up plan: SELF & April-DAUGHTER-SISTER WHILE DAUGHTER GONE  Confirmed correct DME delivered: Elease Hashimoto 11/12/2015    Elease Hashimoto

## 2015-11-12 NOTE — Progress Notes (Signed)
Occupational Therapy Discharge Summary  Patient Details  Name: Brad Tran MRN: [id number] Date of Birth: [birth year]  Today's Date: 11/12/2015   Patient has met 12 of 13 long term goals due to improved activity tolerance, improved balance, functional use of  RIGHT upper extremity and improved attention.  Patient to discharge at overall Supervision level.  Patient's care partner is independent to provide the necessary cognitive assistance at discharge.  Recommend 24 hr/day assistance due to decreased awareness.   Reasons goals not met: Due to decreased awareness of deficits, persistent R hemi inattention, and lack of carryover of attentional learning.   Recommendation:  Patient will benefit from ongoing skilled OT services in home health setting to continue to advance functional skills in the area of BADL, iADL and Vocation.  Equipment: Tub shower bench   Reasons for discharge: discharge from hospital  Patient/family agrees with progress made and goals achieved: Yes  OT Discharge Precautions/Restrictions Precautions Precautions: Fall Precaution Comments: R hemi inattention, rolling R ankle, impulsivity, lack of awareness of deficit Restrictions Weight Bearing Restrictions: No General   Vital Signs  Pain Pain Assessment Pain Assessment: No/denies pain ADL ADL ADL Comments: see functional navigator Vision/Perception  Vision- History Baseline Vision/History: Wears glasses Wears Glasses: Distance only (Pt reports wearing when driving only) Patient Visual Report: No change from baseline Vision- Assessment Vision Assessment?: Yes Eye Alignment: Within Functional Limits Ocular Range of Motion: Within Functional Limits Alignment/Gaze Preference: Within Defined Limits Tracking/Visual Pursuits: Able to track stimulus in all quads without difficulty Visual Fields: Right visual field deficit  Cognition Overall Cognitive Status: Impaired/Different from baseline Arousal/Alertness:  Awake/alert Orientation Level: Oriented X4 Attention: Sustained Sustained Attention: Appears intact Selective Attention: Impaired Selective Attention Impairment: Functional basic Memory: Impaired Memory Impairment: Decreased recall of new information;Decreased short term memory Decreased Short Term Memory: Verbal basic Awareness: Impaired Awareness Impairment: Emergent impairment Problem Solving: Impaired Problem Solving Impairment: Functional basic Behaviors: Impulsive Safety/Judgment: Impaired Sensation Sensation Light Touch: Appears Intact Light Touch Impaired Details: Impaired RLE;Impaired RUE Stereognosis: Not tested Hot/Cold: Appears Intact Proprioception: Impaired by gross assessment Proprioception Impaired Details: Impaired RLE;Impaired RUE Coordination Gross Motor Movements are Fluid and Coordinated: No Fine Motor Movements are Fluid and Coordinated: No Finger Nose Finger Test: Not completed due to hemiplegic RUE.  Heel Shin Test: Unable to perform RLE, [facility] LLE 9 Hole Peg Test: Not completed due to hemiplegic RUE.  Motor  Motor Motor: Hemiplegia;Abnormal tone Motor - Discharge Observations: R hemiparesis UE >LE, hypotonia R extremities Mobility  Bed Mobility Bed Mobility: Supine to Sit;Sit to Supine Supine to Sit: 6: Modified independent (Device/Increase time) Sit to Supine: 6: Modified independent (Device/Increase time) Transfers Sit to Stand: 5: Supervision;With armrests Sit to Stand Details: Verbal cues for precautions/safety Stand to Sit: 5: Supervision;With armrests Stand to Sit Details (indicate cue type and reason): Verbal cues for precautions/safety  Trunk/Postural Assessment  Cervical Assessment Cervical Assessment: Within Functional Limits Thoracic Assessment Thoracic Assessment: Within Functional Limits Lumbar Assessment Lumbar Assessment: Within Functional Limits Postural Control Postural Control: Deficits on evaluation Righting Reactions:  delayed/inefficient stepping reactions with RLE  Balance Balance Balance Assessed: Yes Standardized Balance Assessment Standardized Balance Assessment: Berg Balance Test Berg Balance Test Sit to Stand: Able to stand without using hands and stabilize independently Standing Unsupported: Able to stand safely 2 minutes Sitting with Back Unsupported but Feet Supported on Floor or Stool: Able to sit safely and securely 2 minutes Stand to Sit: Sits safely with minimal use of hands Transfers: Able to transfer safely, minor  use of hands Standing Unsupported with Eyes Closed: Able to stand 10 seconds safely Standing Ubsupported with Feet Together: Able to place feet together independently and stand for 1 minute with supervision From Standing, Reach Forward with Outstretched Arm: Can reach forward >12 cm safely (5") From Standing Position, Pick up Object from Floor: Able to pick up shoe, needs supervision From Standing Position, Turn to Look Behind Over each Shoulder: Looks behind from both sides and weight shifts well Turn 360 Degrees: Able to turn 360 degrees safely but slowly Standing Unsupported, Alternately Place Feet on Step/Stool: Able to complete >2 steps/needs minimal assist Standing Unsupported, One Foot in Front: Able to place foot tandem independently and hold 30 seconds Standing on One Leg: Able to lift leg independently and hold 5-10 seconds Total Score: 47 Dynamic Sitting Balance Dynamic Sitting - Balance Support: Feet supported;No upper extremity supported Dynamic Sitting - Level of Assistance: 6: Modified independent (Device/Increase time) Dynamic Sitting - Balance Activities: Lateral lean/weight shifting;Reaching for objects;Forward lean/weight shifting;Reaching across midline Static Standing Balance Static Standing - Balance Support: During functional activity;No upper extremity supported Static Standing - Level of Assistance: 6: Modified independent (Device/Increase time) Static  Stance: Eyes closed Static Stance: Eyes Closed: x30 sec with S Dynamic Standing Balance Dynamic Standing - Balance Support: During functional activity Dynamic Standing - Level of Assistance: 5: Stand by assistance Dynamic Standing - Balance Activities: Reaching for objects;Reaching across midline;Reaching for weighted objects Extremity/Trunk Assessment RUE Assessment RUE Assessment: Exceptions to [facility] RUE AROM (degrees) Overall AROM Right Upper Extremity: Deficits RUE Overall AROM Comments: R hemiplegia with emerging shoulder abduction, internal rotation, and elbow flexion  RUE Strength RUE Overall Strength Comments: 2 to 2- / 5 MMT in shoulder. 0/5 in wrist and hands.  LUE Assessment LUE Assessment: Within Functional Limits   See Function Navigator for Current Functional Status.  Dierdre Searles 11/12/2015, 3:10 PM

## 2015-11-12 NOTE — Progress Notes (Signed)
Subjective/Complaints: No issues overnite Pleased about disharge Discussed need for pt to establish with PCP  ROS- denies pain or breathing issues  Objective: Vital Signs: Blood pressure (!) 142/78, pulse 71, temperature 97.9 F (36.6 C), temperature source Oral, resp. rate 20, height '5\' 6"'$  (1.676 m), weight 76.3 kg (168 lb 3.2 oz), SpO2 100 %. No results found. Results for orders placed or performed during the hospital encounter of 10/27/15 (from the past 72 hour(s))  Creatinine, serum     Status: None   Collection Time: 11/10/15  5:32 AM  Result Value Ref Range   Creatinine, Ser 0.90 0.61 - 1.24 mg/dL   GFR calc non Af Amer >60 >60 mL/min   GFR calc Af Amer >60 >60 mL/min    Comment: (NOTE) The eGFR has been calculated using the CKD EPI equation. This calculation has not been validated in all clinical situations. eGFR's persistently <60 mL/min signify possible Chronic Kidney Disease.      HEENT: normal Cardio: RRR and no murmur Resp: CTA B/L and unlabored GI: BS positive and NT, ND Extremity:  Pulses positive and No Edema Skin:   Intact Neuro: Alert/Oriented, Normal Sensory and Abnormal Motor 2- shoulder protraction and retraction, 3 R HF, KE, 0/5 R ankle DF/PF Musc/Skel:  Other no pain with UE or LE ROM, tenderness to palpation Right trap Gen NAD   Assessment/Plan: 1. 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  Stable for D/C today F/u PCP in 3-4 weeks F/u PM&R 2 weeks See D/C summary See D/C instructions FIM: Function - Bathing Position: Shower Body parts bathed by patient: Right arm, Chest, Abdomen, Front perineal area, Right upper leg, Left upper leg, Right lower leg, Buttocks, Left lower leg, Back Body parts bathed by helper: Left arm Bathing not applicable: Left arm Assist Level: Supervision or verbal cues  Function- Upper Body Dressing/Undressing What is the patient wearing?: Pull over  shirt/dress Pull over shirt/dress - Perfomed by patient: Thread/unthread right sleeve, Thread/unthread left sleeve, Put head through opening, Pull shirt over trunk Pull over shirt/dress - Perfomed by helper: Thread/unthread right sleeve Assist Level: Supervision or verbal cues Function - Lower Body Dressing/Undressing What is the patient wearing?: Underwear, Pants, [employer], Shoes Position: Other (comment) (Sitting on BSC in bathroom.) Underwear - Performed by patient: Thread/unthread left underwear leg, Pull underwear up/down, Thread/unthread right underwear leg Underwear - Performed by helper: Thread/unthread right underwear leg Pants- Performed by patient: Thread/unthread right pants leg, Thread/unthread left pants leg, Pull pants up/down Pants- Performed by helper: Thread/unthread right pants leg Non-skid slipper socks- Performed by patient: Don/doff left sock Non-skid slipper socks- Performed by helper: Don/doff right sock Socks - Performed by patient: Don/doff right sock, Don/doff left sock Shoes - Performed by patient: Don/doff right shoe, Don/doff left shoe, Fasten right, Fasten left Shoes - Performed by helper: Don/doff right shoe AFO - Performed by patient: Don/doff right AFO AFO - Performed by helper: Don/doff right AFO TED Hose - Performed by patient: Don/doff right TED hose, Don/doff left TED hose TED Hose - Performed by helper: Don/doff right TED hose, Don/doff left TED hose Assist for footwear: Supervision/touching assist Assist for lower body dressing: Supervision or verbal cues  Function - Toileting Toileting activity did not occur: Safety/medical concerns Toileting steps completed by patient: Adjust clothing prior to toileting, Performs perineal hygiene, Adjust clothing after toileting Toileting steps completed by helper: Adjust clothing prior to toileting, Adjust clothing after toileting Toileting Assistive Devices: Grab  bar or rail Assist level: Supervision or verbal  cues  Function - Air cabin crew transfer activity did not occur: Safety/medical concerns Toilet transfer assistive device: Elevated toilet seat/BSC over toilet, Grab bar Assist level to toilet: Supervision or verbal cues Assist level from toilet: Supervision or verbal cues  Function - Chair/bed transfer Chair/bed transfer method: Ambulatory Chair/bed transfer assist level: Supervision or verbal cues Chair/bed transfer assistive device: Orthosis, Cane Chair/bed transfer details: Verbal cues for precautions/safety, Verbal cues for technique, Verbal cues for sequencing  Function - Locomotion: Wheelchair Type: Manual Max wheelchair distance: 120 Assist Level: Supervision or verbal cues Assist Level: Supervision or verbal cues Wheel 150 feet activity did not occur: Safety/medical concerns Turns around,maneuvers to table,bed, and toilet,negotiates 3% grade,maneuvers on rugs and over doorsills: No Function - Locomotion: Ambulation Assistive device: Orthosis, Cane-quad Max distance: 300 Assist level: Supervision or verbal cues Assist level: Supervision or verbal cues Walk 50 feet with 2 turns activity did not occur: Safety/medical concerns Assist level: Supervision or verbal cues Walk 150 feet activity did not occur: Safety/medical concerns Assist level: Supervision or verbal cues Walk 10 feet on uneven surfaces activity did not occur: Safety/medical concerns Assist level: Supervision or verbal cues  Function - Comprehension Comprehension: Auditory Comprehension assist level: Follows complex conversation/direction with no assist  Function - Expression Expression: Verbal Expression assist level: Expresses complex ideas: With no assist  Function - Social Interaction Social Interaction assist level: Interacts appropriately with others - No medications needed.  Function - Problem Solving Problem solving assist level: Solves complex problems: Recognizes &  self-corrects  Function - Memory Memory assist level: Complete Independence: No helper Patient normally able to recall (first 3 days only): Current season  Medical Problem List and Plan: 1.  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  2.  DVT Prophylaxis/Anticoagulation: Subcutaneous Lovenox. Monitor for any bleeding episodes 3. Pain Management: Tylenol as needed, no further shoulder pain 4. Hyperlipidemia. Lipitor 5. Neuropsych: This patient is capable of making decisions on his own behalf. 6. Skin/Wound Care: Routine skin checks 7. Fluids/Electrolytes/Nutrition: Routine I&O's, creat  wnl 8. HTN: Monitor, lopressor, prn clonidine,occ BP spike , HRs now >75 , may increase lopressor, BPs responding nicely Vitals:   11/11/15 1935 11/12/15 0527  BP: 135/76 (!) 142/78  Pulse: 78 71  Resp:  20  Temp:  97.9 F (36.6 C)   9. Prediabetes:Hgb A1C 5.7 Monitor   LOS (Days) 16 A FACE TO FACE EVALUATION WAS PERFORMED  KIRSTEINS,ANDREW E 11/12/2015, 7:43 AM

## 2015-11-12 NOTE — Progress Notes (Signed)
Speech Language Pathology Discharge Summary  Patient Details  Name: Brad Tran MRN: [id number] Date of Birth: 1965-12-02  Today's Date: 11/12/2015 SLP Individual Time: 1000-1030 SLP Individual Time Calculation (min): 30 min    Skilled Therapeutic Interventions:  Pt was able to complete money counting task with 80% acc and increased time. Pt completed functional math reasoning problems with 80% acc due to errors with reading secondary to limited attention to detail. Discussed with pt and sister.      Patient has met 3 of 3 long term goals.  Patient to discharge at overall Supervision level.  Reasons goals not met:     Clinical Impression/Discharge Summary: Pt has met 3 of 3 long term goals, but verbalizes better awareness of deficits than he is able to demonstrate in functional activities. 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 Discussed with pt and sister, pt's need for assist with financial and medication management.   Care Partner:  Caregiver Able to Provide Assistance: Yes  Type of Caregiver Assistance: Physical;Cognitive  Recommendation:  24 hour supervision/assistance;Outpatient SLP;Home Health SLP  Rationale for SLP Follow Up: Reduce caregiver burden;Maximize cognitive function and independence   Equipment:     Reasons for discharge: Discharged from hospital   Patient/Family Agrees with Progress Made and Goals Achieved: Yes   Function:  Eating Eating                 Cognition Comprehension Comprehension assist level: Follows complex conversation/direction with extra time/assistive device  Expression   Expression assist level: Expresses basic needs/ideas: With extra time/assistive device  Social Interaction Social Interaction assist level: Interacts appropriately with others - No medications needed.  Problem Solving Problem solving assist level: Solves basic 90% of the time/requires cueing < 10% of  the time  Memory Memory assist level: Recognizes or recalls 90% of the time/requires cueing < 10% of the time   Vinetta Bergamo MA, CCC-SLP 11/12/2015, 4:08 PM

## 2015-11-12 NOTE — Discharge Instructions (Signed)
Inpatient Rehab Discharge Instructions  Brad Tran Discharge date and time: 11/12/15   Activities/Precautions/ Functional Status: Activity: no lifting, driving, or strenuous exercise till cleared by MD.  Diet: cardiac diet   Wound Care: none needed Functional status:  ___ No restrictions     ___ Walk up steps independently _X__ 24/7 supervision/assistance   ___ Walk up steps with assistance ___ Intermittent supervision/assistance  ___ Bathe/dress independently _X__ Walk with cane                           ___ Bathe/dress with assistance ___ Walk Independently    ___ Shower independently ___ Walk with assistance    ___ Shower with assistance _X__ No alcohol     ___ Return to work/school ________     _x STROKE/TIA DISCHARGE INSTRUCTIONS SMOKING Cigarette smoking nearly doubles your risk of having a stroke & is the single most alterable risk factor  If you smoke or have smoked in the last 12 months, you are advised to quit smoking for your health.  Most of the excess cardiovascular risk related to smoking disappears within a year of stopping.  Ask you doctor about anti-smoking medications  [place] Quit Line: 1-800-QUIT NOW  Free Smoking Cessation Classes [phone]  CHOLESTEROL Know your levels; limit fat & cholesterol in your diet  Lipid Panel     Component Value Date/Time   CHOL 269* 10/25/2015 0747   TRIG 269* 10/25/2015 0747   HDL 39* 10/25/2015 0747   CHOLHDL 6.9 10/25/2015 0747   VLDL 54* 10/25/2015 0747   LDLCALC 176* 10/25/2015 0747      Many patients benefit from treatment even if their cholesterol is at goal.  Goal: Total Cholesterol (CHOL) less than 160  Goal:  Triglycerides (TRIG) less than 150  Goal:  HDL greater than 40  Goal:  LDL (LDLCALC) less than 100   BLOOD PRESSURE American Stroke Association blood pressure target is less that 120/80 mm/Hg  Your discharge blood pressure is:  BP: 132/85 mmHg  Monitor your blood pressure  Limit your salt and  alcohol intake  Many individuals will require more than one medication for high blood pressure  DIABETES (A1c is a blood sugar average for last 3 months) Goal HGBA1c is under 7% (HBGA1c is blood sugar average for last 3 months)  Diabetes: No known diagnosis of diabetes    Lab Results  Component Value Date   HGBA1C 5.7* 10/25/2015     Your HGBA1c can be lowered with medications, healthy diet, and exercise.  Check your blood sugar as directed by your physician  Call your physician if you experience unexplained or low blood sugars.  PHYSICAL ACTIVITY/REHABILITATION Goal is 30 minutes at least 4 days per week  Activity: No driving, Therapies: Physical Therapy: Home Health Return to work:   Activity decreases your risk of heart attack and stroke and makes your heart stronger.  It helps control your weight and blood pressure; helps you relax and can improve your mood.  Participate in a regular exercise program.  Talk with your doctor about the best form of exercise for you (dancing, walking, swimming, cycling).  DIET/WEIGHT Goal is to maintain a healthy weight  Your discharge diet is: Diet Heart Room service appropriate?: Yes; Fluid consistency:: Thin  liquids Your height is:  Height: 5\' 6"  (167.6 cm) Your current weight is: Weight: 68.13 kg (150 lb 3.2 oz) Your Body Mass Index (BMI) is:  BMI (Calculated): 24.3  Following the type of diet specifically designed for you will help prevent another stroke.  You are at goal weight.   Your goal Body Mass Index (BMI) is 19-24.  Healthy food habits can help reduce 3 risk factors for stroke:  High cholesterol, hypertension, and excess weight.  RESOURCES Stroke/Support Group:  Call [phone]   STROKE EDUCATION PROVIDED/REVIEWED AND GIVEN TO PATIENT Stroke warning signs and symptoms How to activate emergency medical system (call 911). Medications prescribed at discharge. Need for follow-up after discharge. Personal risk factors for  stroke. Pneumonia vaccine given:  Flu vaccine given:  My questions have been answered, the writing is legible, and I understand these instructions.  I will adhere to these goals & educational materials that have been provided to me after my discharge from the hospital.    Special Instructions: 1. Needs assistance with medication and financial management.  2. Family to lock up guns, car keys and other items that could cause potential self harm.   COMMUNITY REFERRALS UPON DISCHARGE:    Home Health:   PT, OT, SP,RN, SW    Agency:[facility] Phone:[phone]   Date of last service:11/12/2015  Medical Equipment/Items Ordered:LBQC & TUB BENCH  Agency/Supplier:[facility]   [phone] Other:DISABILITY AND MEDICAID PENDING FAMILY TO FOLLOW UP WITH PCP ARRANGED VIA [facility] APPT 8/31 @ 11:00 AM   GENERAL COMMUNITY RESOURCES FOR PATIENT/FAMILY: Support Groups:[facility] SUPPORT GROUP EVERY SECOND Thursday @ 3:00-4:00 PM ON THE REHAB UNIT QUESTIONS CONTACT KATIE  [phone]  My questions have been answered and I understand these instructions. I will adhere to these goals and the provided educational materials after my discharge from the hospital.  Patient/Caregiver Signature _______________________________ Date __________  Clinician Signature _______________________________________ Date __________  Please bring this form and your medication list with you to all your follow-up doctor's appointments.

## 2015-11-12 NOTE — Progress Notes (Signed)
Physical Therapy Discharge Summary  Patient Details  Name: Brad Tran MRN: [id number] Date of Birth: 06-01-1965  Today's Date: 11/12/2015 PT Individual Time: 1100-1155 PT Individual Time Calculation (min): 55 min     Patient has met 11 of 11 long term goals due to improved activity tolerance, improved balance, improved postural control, increased strength, ability to compensate for deficits, functional use of  right upper extremity and right lower extremity, improved attention, improved awareness and improved coordination.  Patient to discharge at an ambulatory level Supervision.   Patient's care partner is independent to provide the necessary physical and cognitive assistance at discharge.  Reasons goals not met: All goals met  Recommendation:  Patient will benefit from ongoing skilled PT services in home health setting to continue to advance safe functional mobility, address ongoing impairments in strength, coordination, motor control, activity tolerance, dynamic balance, R attention, and minimize fall risk.  Equipment: Quad cane, AFO  Reasons for discharge: treatment goals met and discharge from hospital  Patient/family agrees with progress made and goals achieved: Yes  PT Discharge Precautions/RestrictionsPrecautions Precautions: Fall Precaution Comments: R hemi inattention, rolling R ankle, impulsivity, lack of awareness of deficit Restrictions Weight Bearing Restrictions: No Pain Pain Assessment Pain Score: 0-No pain Vision/Perception  Vision - Assessment Eye Alignment: Within Functional Limits Ocular Range of Motion: Within Functional Limits Alignment/Gaze Preference: Within Defined Limits Tracking/Visual Pursuits: Able to track stimulus in all quads without difficulty  Cognition Overall Cognitive Status: Impaired/Different from baseline Arousal/Alertness: Awake/alert Orientation Level: Oriented X4 Attention: Sustained Sustained Attention: Appears intact Selective  Attention: Impaired Selective Attention Impairment: Functional basic Memory: Impaired Memory Impairment: Decreased recall of new information;Decreased short term memory Decreased Short Term Memory: Verbal basic Awareness: Impaired Awareness Impairment: Emergent impairment Problem Solving: Impaired Problem Solving Impairment: Functional basic Behaviors: Impulsive Safety/Judgment: Impaired Sensation Sensation Light Touch: Impaired by gross assessment Light Touch Impaired Details: Impaired RLE;Impaired RUE Stereognosis: Not tested Hot/Cold: Not tested Proprioception: Impaired by gross assessment Proprioception Impaired Details: Impaired RLE;Impaired RUE Coordination Gross Motor Movements are Fluid and Coordinated: No Heel Shin Test: Unable to perform RLE, WFL LLE Motor  Motor Motor: Hemiplegia;Abnormal tone Motor - Discharge Observations: R hemiparesis UE >LE, hypotonia R extremities  Mobility Bed Mobility Bed Mobility: Supine to Sit;Sit to Supine Supine to Sit: 6: Modified independent (Device/Increase time) Sit to Supine: 6: Modified independent (Device/Increase time) Transfers Transfers: Yes Sit to Stand: 5: Supervision;With armrests Sit to Stand Details: Verbal cues for precautions/safety Stand to Sit: 5: Supervision;With armrests Stand to Sit Details (indicate cue type and reason): Verbal cues for precautions/safety Stand Pivot Transfers: 5: Supervision Stand Pivot Transfer Details: Verbal cues for gait pattern;Verbal cues for precautions/safety;Verbal cues for safe use of DME/AE Stand Pivot Transfer Details (indicate cue type and reason): cues for attention to RLE Locomotion  Ambulation Ambulation: Yes Ambulation/Gait Assistance: 5: Supervision Ambulation Distance (Feet): 250 Feet Assistive device: Large base quad cane Ambulation/Gait Assistance Details: Verbal cues for precautions/safety Ambulation/Gait Assistance Details: cues for R attention, foot  clearance Gait Gait: Yes Gait Pattern: Impaired Gait Pattern: Poor foot clearance - right;Decreased stance time - right;Lateral hip instability;Lateral trunk lean to left Gait velocity: 1.8 ft/sec indicative of high risk for falls Stairs / Additional Locomotion Stairs: Yes Stairs Assistance: 5: Supervision Stairs Assistance Details: Verbal cues for gait pattern;Verbal cues for precautions/safety Stair Management Technique: One rail Left;Forwards;Step to pattern Number of Stairs: 12 Height of Stairs: 6 Ramp: 5: Supervision Curb: 5: Supervision Wheelchair Mobility Wheelchair Mobility: No  Trunk/Postural Assessment  Cervical  Assessment Cervical Assessment: Within Functional Limits Thoracic Assessment Thoracic Assessment: Within Functional Limits Lumbar Assessment Lumbar Assessment: Within Functional Limits Postural Control Postural Control: Deficits on evaluation Righting Reactions: delayed/inefficient stepping reactions with RLE  Balance Balance Balance Assessed: Yes Standardized Balance Assessment Standardized Balance Assessment: Berg Balance Test Berg Balance Test Sit to Stand: Able to stand without using hands and stabilize independently Standing Unsupported: Able to stand safely 2 minutes Sitting with Back Unsupported but Feet Supported on Floor or Stool: Able to sit safely and securely 2 minutes Stand to Sit: Sits safely with minimal use of hands Transfers: Able to transfer safely, minor use of hands Standing Unsupported with Eyes Closed: Able to stand 10 seconds safely Standing Ubsupported with Feet Together: Able to place feet together independently and stand for 1 minute with supervision From Standing, Reach Forward with Outstretched Arm: Can reach forward >12 cm safely (5") From Standing Position, Pick up Object from Floor: Able to pick up shoe, needs supervision From Standing Position, Turn to Look Behind Over each Shoulder: Looks behind from both sides and weight  shifts well Turn 360 Degrees: Able to turn 360 degrees safely but slowly Standing Unsupported, Alternately Place Feet on Step/Stool: Able to complete >2 steps/needs minimal assist Standing Unsupported, One Foot in Front: Able to place foot tandem independently and hold 30 seconds Standing on One Leg: Able to lift leg independently and hold 5-10 seconds Total Score: 47 Dynamic Sitting Balance Dynamic Sitting - Balance Support: Feet supported;No upper extremity supported Dynamic Sitting - Level of Assistance: 6: Modified independent (Device/Increase time) Dynamic Sitting - Balance Activities: Lateral lean/weight shifting;Reaching for objects;Forward lean/weight shifting;Reaching across midline Static Standing Balance Static Standing - Balance Support: During functional activity;No upper extremity supported Static Standing - Level of Assistance: 6: Modified independent (Device/Increase time) Static Stance: Eyes closed Static Stance: Eyes Closed: x30 sec with S Dynamic Standing Balance Dynamic Standing - Balance Support: During functional activity Dynamic Standing - Level of Assistance: 5: Stand by assistance Dynamic Standing - Balance Activities: Reaching for objects;Reaching across midline;Reaching for weighted objects Extremity Assessment  RUE Assessment RUE Assessment: Exceptions to [facility] RUE AROM (degrees) Overall AROM Right Upper Extremity: Deficits RUE Overall AROM Comments: R hemiplegia with emerging shoulder abduction, internal rotation, and elbow flexion  RUE Strength RUE Overall Strength Comments: 2 to 2- / 5 MMT LUE Assessment LUE Assessment: Within Functional Limits RLE Assessment RLE Assessment: Exceptions to [facility] RLE AROM (degrees) Overall AROM Right Lower Extremity: Deficits RLE Strength Right Hip Flexion: 4-/5 Right Knee Flexion: 3+/5 Right Knee Extension: 4/5 Right Ankle Dorsiflexion: 0/5 Right Ankle Plantar Flexion: 0/5 Right Ankle Inversion: 0/5 Right Ankle  Eversion: 0/5 LLE Assessment LLE Assessment: Within Functional Limits LLE AROM (degrees) Overall AROM Left Lower Extremity: Within functional limits for tasks assessed LLE Strength Left Hip Flexion: 5/5 Left Knee Flexion: 5/5 Left Knee Extension: 5/5 Left Ankle Dorsiflexion: 5/5  Skilled Therapeutic Intervention: Pt received seated in recliner, denies pain and agreeable to treatment. Assessed all mobility, strength, balance, and coordination as above with S overall using quad cane and RLE AFO. Pt demonstrating slower speed with movement and more control with no significant LOBs during session. Stairs performed 2 reps; first with L handrail and 16 steps, second time using quad cane and 4 stairs to prepare for home entry. Requires cues for sequencing as pt attempts to descend with LLE first, and educated regarding flexibility of RLE AFO and importance of descending RLE first. Pt educated repeatedly on importance of wearing AFO  when home any time he is ambulating, as pt currently has 0/5 strength in ankle dorsiflexors/plantarflexors/inversion/eversion musculature. Nustep performed with BUE/BLE, and RUE in hand splint x10 min, level 6 with average 100 steps/min; cues to slow down to improve R knee control. Returned to room with gait using quad cane and S. Remained seated In recliner with quick release belt intact at end of session, all needs in reach. Pt with no further questions/concerns regarding d/c at this time.    See Function Navigator for Current Functional Status.  Benjiman Core Tygielski 11/12/2015, 12:25 PM

## 2015-11-12 NOTE — Progress Notes (Signed)
Patient and his sister discussed all the discharged instructions with Marissa Nestle ,PA,before they left the floor.

## 2015-11-15 NOTE — Discharge Summary (Signed)
Discharge summary job # [id number]

## 2015-11-15 NOTE — Discharge Summary (Signed)
Brad Tran, Brad Tran                 ACCOUNT NO.:  [id number]  MEDICAL RECORD NO.:  [id number]  LOCATION:  [id number]                        FACILITY:  [facility]  PHYSICIAN:  Brad Tran, P.A.  DATE OF BIRTH:  1966/03/19  DATE OF ADMISSION:  10/27/2015 DATE OF DISCHARGE:  11/12/2015                              DISCHARGE SUMMARY   DISCHARGE DIAGNOSES: 1. Left basal ganglia, corona radiata infarct. 2. Subcutaneous Lovenox for DVT prophylaxis. 3. Pain management. 4. Hyperlipidemia. 5. Hypertension. 6. Prediabetes.  HISTORY OF PRESENT ILLNESS:  This is a 50 year old right-handed limited English-speaking male unremarkable past medical history on no scheduled medication.  He lives with family independent prior to admission. Presented October 25, 2015, right-sided weakness, slurred speech.  Urine drug screen negative.  Cranial CT scan no acute changes.  Small chronic infarct left thalamus.  MRI of the brain showed acute left basal ganglia, nonhemorrhagic infarct.  MRA with no emergent large vessel occlusion or stenosis.  Carotid Dopplers with no ICA stenosis.  The patient did not receive tPA.  Maintained on aspirin for CVA prophylaxis. Subcutaneous Lovenox for DVT prophylaxis.  The patient was admitted for a comprehensive rehab program.  PAST MEDICAL HISTORY:  See discharge diagnoses.  SOCIAL HISTORY:  Independent prior to admission living with family. Working full time.  Functional status upon admission to rehab services was moderate assist, stand pivot transfers, moderate assist sit to stand; min to mod assist activities of daily living.  PHYSICAL EXAMINATION:  VITAL SIGNS:  Blood pressure 162/107, pulse 87, temperature 98, respirations 20. GENERAL:  This was an alert male, he was able to provide his name and place. LUNGS:  Clear to auscultation.  No wheeze. CARDIAC:  Regular rate and rhythm.  No murmur. ABDOMEN:  Soft, nontender.  Good bowel sounds. NEURO:  Followed simple demonstrated  commands.  REHABILITATION HOSPITAL COURSE:  Patient was admitted to inpatient rehab services with therapies initiated on a 3-hour daily basis, consisting of physical therapy, occupational therapy, speech therapy, and rehabilitation nursing.  The following issues were addressed during the patient's rehabilitation stay.  Pertaining to [name] left basal ganglia infarct remained stable, maintained on aspirin therapy.  He would follow up Neurology Services.  Subcutaneous Lovenox for DVT prophylaxis.  No bleeding episodes.  Maintained on Lipitor for hyperlipidemia.  Blood pressures controlled.  Monitor on Lopressor with permissive hypertension.  Noted to be prediabetic, hemoglobin A1c of 5.7 with diabetic teaching.  The patient received weekly collaborative interdisciplinary team conferences to discuss estimated length of stay, family teaching, any barriers to his discharge.  The patient showed improved activity tolerance, improved balance, improved postural control.  Increased strength ability to compensate for deficits, functional use of right upper extremity, right lower extremity. Improved attention awareness, improved coordination, ambulatory at a supervision level.  Could gather belongings for activities of daily living and homemaking.  Noted some right hemi-inattention.  Tolerating a regular diet.  He could communicate his needs.  Complete money counting task 80% accurate.  Completed functional math reasoning problems 80%. Full family teaching was completed and plan was discharge to home.  DISCHARGE MEDICATIONS: 1. Aspirin 325 mg p.o. daily. 2. Lipitor 80 mg p.o. daily. 3.  Lopressor 50 mg p.o. b.i.d.  DIET:  His diet was regular.  He would follow Dr. Erick Tran at the outpatient rehab center as directed; Dr. Roda Tran Neurology Services, Dr. Huntley Tran, nurse practitioner Medical Management December 17, 2015 at 11:00 a.m.  Follow up [facility].  The diet was  regular.     Brad Tran, P.A.     DA/MEDQ  D:  11/15/2015  T:  11/15/2015  Job:  [id number]

## 2015-11-16 ENCOUNTER — Telehealth: Payer: Self-pay

## 2015-11-16 NOTE — Telephone Encounter (Signed)
1. Are you/is patient experiencing any problems since coming home? Are there any questions regarding any aspect of care? No issues.  2. Are there any questions regarding medications administration/dosing? Are meds being taken as prescribed? Patient should review meds with caller to confirm. Meds confirmed.  3. Have there been any falls? No falls. 4. Has Home Health been to the house and/or have they contacted you? If not, have you tried to contact them? Can we help you contact them? No appointment scheduled yet. 5. Are bowels and bladder emptying properly? Are there any unexpected incontinence issues? If applicable, is patient following bowel/bladder programs? No issues.  6. Any fevers, problems with breathing, unexpected pain? No issues 7. Are there any skin problems or new areas of breakdown? No issues.  8. Has the patient/family member arranged specialty MD follow up (ie cardiology/neurology/renal/surgical/etc)?  Can we help arrange? Pt has made follow up appointments, except with Dr. Roda Shutters. Provided pt with the phone number.  9. Does the patient need any other services or support that we can help arrange? No services. 10. Are caregivers following through as expected in assisting the patient? Daughter is assisting pt.  11. Has the patient quit smoking, drinking alcohol, or using drugs as recommended? Pt is not smoking, drinking alcohol, or using drugs.   Spoke with pt. He is aware of appointment with [facility] on 11/23/15 @ 8:45am. Pt packet will be mailed.

## 2015-11-23 ENCOUNTER — Ambulatory Visit (HOSPITAL_BASED_OUTPATIENT_CLINIC_OR_DEPARTMENT_OTHER): Payer: Self-pay | Admitting: Physical Medicine & Rehabilitation

## 2015-11-23 ENCOUNTER — Encounter: Payer: Medicaid Other | Attending: Physical Medicine & Rehabilitation

## 2015-11-23 ENCOUNTER — Encounter: Payer: Self-pay | Admitting: Physical Medicine & Rehabilitation

## 2015-11-23 VITALS — BP 133/88 | HR 56 | Resp 16

## 2015-11-23 DIAGNOSIS — I69354 Hemiplegia and hemiparesis following cerebral infarction affecting left non-dominant side: Secondary | ICD-10-CM | POA: Insufficient documentation

## 2015-11-23 DIAGNOSIS — E785 Hyperlipidemia, unspecified: Secondary | ICD-10-CM

## 2015-11-23 DIAGNOSIS — G8191 Hemiplegia, unspecified affecting right dominant side: Secondary | ICD-10-CM

## 2015-11-23 DIAGNOSIS — Z5189 Encounter for other specified aftercare: Secondary | ICD-10-CM | POA: Diagnosis present

## 2015-11-23 DIAGNOSIS — I63312 Cerebral infarction due to thrombosis of left middle cerebral artery: Secondary | ICD-10-CM

## 2015-11-23 DIAGNOSIS — I69359 Hemiplegia and hemiparesis following cerebral infarction affecting unspecified side: Secondary | ICD-10-CM

## 2015-11-23 DIAGNOSIS — I10 Essential (primary) hypertension: Secondary | ICD-10-CM | POA: Insufficient documentation

## 2015-11-23 DIAGNOSIS — I639 Cerebral infarction, unspecified: Secondary | ICD-10-CM

## 2015-11-23 MED ORDER — ATORVASTATIN CALCIUM 80 MG PO TABS: 80.0000 mg | ORAL_TABLET | Freq: Every day | ORAL | 0 refills | Status: DC

## 2015-11-23 MED ORDER — METOPROLOL TARTRATE 50 MG PO TABS: 50.0000 mg | ORAL_TABLET | Freq: Two times a day (BID) | ORAL | 0 refills | Status: DC

## 2015-11-23 NOTE — Progress Notes (Signed)
Subjective:    Patient ID: Brad Tran, male    Brad Chance06/1967, 50 y.o.   MRN: [id number] 50 year old right-handed limited English-speaking male unremarkable past medical history on no scheduled medication.  He lives with family independent prior to admission. Presented October 25, 2015, right-sided weakness, slurred speech.  Urine drug screen negative.  Cranial CT scan no acute changes.  Small chronic infarct left thalamus.  MRI of the brain showed acute left basal ganglia, nonhemorrhagic infarct.  MRA with no emergent large vessel occlusion or stenosis.  Carotid Dopplers with no ICA stenosis.  The patient did not receive tPA.  Maintained on aspirin for CVA prophylaxis.  Comprehensive inpatient rehabilitation at [facility] stroke program DATE OF ADMISSION:  10/27/2015 DATE OF DISCHARGE:  11/12/2015  HPI PT,OT HH finishing up Does not have primary care physician, running out of medication in about 3 weeks. He is independent with bathing, dressing, grooming, toileting. He ambulates with a right AFO, as well as right arm sling as well as quad cane held in the left hand Patient stays with his daughter who provides supervision. He is not driving, he has not returned to work.    Pain Inventory Average Pain 0 Pain Right Now 0 My pain is no pain  In the last 24 hours, has pain interfered with the following? General activity 0 Relation with others 0 Enjoyment of life 0 What TIME of day is your pain at its worst? no pain Sleep (in general) Fair  Pain is worse with: no pain Pain improves with: no pain Relief from Meds: 0  Mobility walk with assistance use a cane how many minutes can you walk? 60+ ability to climb steps?  yes do you drive?  no Do you have any goals in this area?  yes  Function disabled: date disabled 10/25/2015 I need assistance with the following:  shopping Do you have any goals in this area?  no  Neuro/Psych No problems in this area  Prior  Studies hospital f/u  Physicians involved in your care hospital f/u   Family History  Problem Relation Age of Onset  . Stroke Brad Tran   . Heart attack Brad Tran    Social History   Social History  . Marital status: Divorced    Spouse name: N/A  . Number of children: N/A  . Years of education: N/A   Social History Main Topics  . Smoking status: Never Smoker  . Smokeless tobacco: Never Used  . Alcohol use Yes     Comment: A few, some days  . Drug use: No  . Sexual activity: Not Asked   Other Topics Concern  . None   Social History Narrative  . None   History reviewed. No pertinent surgical history. History reviewed. No pertinent past medical history. BP 133/88 (BP Location: Left Arm, Patient Position: Sitting, Cuff Size: Normal)   Pulse (!) 56   Resp 16   SpO2 96%   Opioid Risk Score:   Fall Risk Score:  `1  Depression screen PHQ 2/9  Depression screen PHQ 2/9 11/23/2015  Decreased Interest 0  Down, Depressed, Hopeless 0  PHQ - 2 Score 0  Altered sleeping 0  Tired, decreased energy 1  Change in appetite 0  Feeling bad or failure about yourself  0  Trouble concentrating 1  Moving slowly or fidgety/restless 1  Suicidal thoughts 0  PHQ-9 Score 3  Difficult doing work/chores Not difficult at all    Review of Systems  Constitutional: Negative.  HENT: Negative.   Eyes: Negative.   Respiratory: Negative.   Cardiovascular: Negative.   Gastrointestinal: Negative.   Endocrine: Negative.   Genitourinary: Negative.   Musculoskeletal: Positive for gait problem.  Skin: Negative.   Allergic/Immunologic: Negative.   Hematological: Negative.   Psychiatric/Behavioral: Negative.   All other systems reviewed and are negative.      Objective:   Physical Exam  Constitutional: He is oriented to person, place, and time. He appears well-developed and well-nourished.  HENT:  Head: Normocephalic and atraumatic.  Eyes: Conjunctivae and EOM are normal. Pupils are  equal, round, and reactive to light.  Neck: Normal range of motion.  Cardiovascular: Normal rate, regular rhythm and normal heart sounds.   No murmur heard. Pulmonary/Chest: Effort normal and breath sounds normal. No respiratory distress. He has no wheezes.  Abdominal: Soft. Bowel sounds are normal. He exhibits no distension. There is no tenderness.  Neurological: He is alert and oriented to person, place, and time. No sensory deficit. He exhibits abnormal muscle tone. Coordination and gait abnormal.  Motor strength is 3 minus in the right deltoid, biceps, triceps, 0/5 grip, 0/5, finger extensors  Motor strength is 4 at the right hip flexors 4+ in the knee extensors, trace ankle dorsiflexion and plantarflexion, absent toe flexion and toe extension  Tone Ashworth 2 at the right biceps, 0 at the finger flexors and wrist flexors.  Nursing note and vitals reviewed.         Assessment & Plan:  1. 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 Recommend outpatient PT, OT. Given that he still has very limited strength in the distal right upper and lower limb.  Physical medicine and rehabilitation follow-up in 4 weeks  Continue aspirin 325 mg for stroke prophylaxis  2. Hypertension: Continue metoprolol 50 mg twice a day Prescription refilled  3. Hyperlipidemia. Continue Lipitor 80 mg per day  Have made referral to community health and wellness. Internal medicine

## 2015-11-23 NOTE — Patient Instructions (Signed)
Referral made to outpatient PT and OT, they will call you. Referral made to community health and wellness. Internal medicine

## 2015-11-25 ENCOUNTER — Telehealth: Payer: Self-pay | Admitting: Physical Medicine & Rehabilitation

## 2015-11-25 NOTE — Telephone Encounter (Signed)
Patients daughter needed to let us know patient is feeling real dizzy, his blood pressure was in range of 110 to 120 over 65/70.  Please call her at [phone].

## 2015-11-26 NOTE — Telephone Encounter (Signed)
Spoke to daughter. She reports patient is doing better today. We discussed that the blood pressure yesterday was not very low and may not have been responsible for his dizziness.

## 2015-12-07 ENCOUNTER — Encounter: Payer: Self-pay | Admitting: Rehabilitation

## 2015-12-07 ENCOUNTER — Ambulatory Visit: Payer: Medicaid Other | Admitting: Occupational Therapy

## 2015-12-07 ENCOUNTER — Ambulatory Visit: Payer: Medicaid Other | Attending: Physical Medicine & Rehabilitation | Admitting: Rehabilitation

## 2015-12-07 DIAGNOSIS — R278 Other lack of coordination: Secondary | ICD-10-CM

## 2015-12-07 DIAGNOSIS — R2681 Unsteadiness on feet: Secondary | ICD-10-CM | POA: Insufficient documentation

## 2015-12-07 DIAGNOSIS — R2689 Other abnormalities of gait and mobility: Secondary | ICD-10-CM | POA: Insufficient documentation

## 2015-12-07 DIAGNOSIS — I69351 Hemiplegia and hemiparesis following cerebral infarction affecting right dominant side: Secondary | ICD-10-CM | POA: Diagnosis present

## 2015-12-07 NOTE — Therapy (Signed)
[facility] [address] [place], [place], [address] Phone: [phone]   Fax:  [phone]  Physical Therapy Evaluation  Patient Details  Name: Brad Tran MRN: [id number] Date of Birth: [birth year] Referring Provider: [name], MD  Encounter Date: 12/07/2015      PT End of Session - 12/07/15 1846    Visit Number 1   Number of Visits 13   Date for PT Re-Evaluation 01/21/16   Authorization Type Medicaid (awaiting approval from [place])   PT Start Time [phone]   PT Stop Time 0930   PT Time Calculation (min) 43 min   Activity Tolerance Patient tolerated treatment well   Behavior During Therapy [facility] for tasks assessed/performed      History reviewed. No pertinent past medical history.  History reviewed. No pertinent surgical history.  There were no vitals filed for this visit.       Subjective Assessment - 12/07/15 0849    Subjective "I want to work on my hand and my foot, its not moving."    Patient is accompained by: Family member   Pertinent History Pronouned "Choo-un" , daughter Brad Tran    Limitations Walking;House hold activities   Currently in Pain? No/denies            [facility] PT Assessment - 12/07/15 9528      Assessment   Medical Diagnosis CVA   Referring Provider [name], MD   Onset Date/Surgical Date 10/25/15   Hand Dominance Right   Prior Therapy acute, IP rehab, HHPT     Precautions   Precautions Fall     Restrictions   Weight Bearing Restrictions No     Balance Screen   Has the patient fallen in the past 6 months No   Has the patient had a decrease in activity level because of a fear of falling?  No   Is the patient reluctant to leave their home because of a fear of falling?  No     Home Environment   Living Environment --   Living Arrangements Non-relatives/Friends  has two roommates   Available Help at Discharge Family;Available PRN/intermittently  daughter there  until 7 at night   Type of Home House   Home Access Stairs to enter   Entrance Stairs-Number of Steps 3   Entrance Stairs-Rails Left   Home Layout One level   Home Equipment Cane - quad;Tub bench  R AFO     Prior Function   Level of Independence Independent   Vocation Part time employment  sometimes hours varied   Vocation Requirements Works at [employer] up to [employer], sometimes needing to climb on ladder     Cognition   Overall Cognitive Status Within Functional Limits for tasks assessed  daughter states delayed processing     Sensation   Light Touch Appears Intact   Hot/Cold Appears Intact   Proprioception Appears Intact     Coordination   Gross Motor Movements are Fluid and Coordinated Yes   Fine Motor Movements are Fluid and Coordinated No   Heel Shin Test Pt able to perform, somewhat decreased fluidity due to strength deficits, but grossly coordinated.       ROM / Strength   AROM / PROM / Strength Strength     Strength   Overall Strength Deficits   Overall Strength Comments LLE WFL, RLE hip flex 4/5, knee ext 5/5, knee flex 2/5, ankle DF 2-/5, ankle PF 1/5, ankle inversion and eversion 0/5  Transfers   Transfers Sit to Stand;Stand to Sit   Sit to Stand 6: Modified independent (Device/Increase time)   Five time sit to stand comments  9.91 secs without hands   Stand to Sit 6: Modified independent (Device/Increase time)     Ambulation/Gait   Ambulation/Gait Yes   Ambulation/Gait Assistance 5: Supervision   Ambulation Distance (Feet) 115 Feet  and another 52'   Assistive device Large base quad cane  with and without R AFO   Gait Pattern Step-through pattern;Decreased arm swing - left;Decreased stance time - right;Decreased step length - left;Decreased stride length;Decreased dorsiflexion - right;Right steppage;Right genu recurvatum;Trunk flexed;Poor foot clearance - right   Ambulation Surface Level;Indoor   Gait velocity 1.69 ft/sec    Stairs Yes    Stairs Assistance 5: Supervision   Stairs Assistance Details (indicate cue type and reason) cues for leading with LLE   Stair Management Technique With cane   Number of Stairs 4   Height of Stairs 6   Gait Comments Pt states he was ambulating at home without brace and with [facility], therefore had him perform to assess safety.  Note increased R ankle supination during swing phase of gait (felt that it was more weakness related than tone) and increased R knee recurvatum, therefore recommended that he maintain brace on during gait.                             PT Education - 12/07/15 1844    Education provided Yes   Education Details Education on evaluation findings, wearing brace when ambulatory, and initial HEP for RLE strengthening   Person(s) Educated Patient;Child(ren)   Methods Explanation;Demonstration;Handout;Verbal cues   Comprehension Verbalized understanding;Returned demonstration          PT Short Term Goals - 12/07/15 1913      PT SHORT TERM GOAL #1   Title Pt will initiate HEP in order to indicate improved functional mobility and decreased fall risk.  (Target Date: 12/28/15)   Baseline Pt has only two exercises for HEP at this time, requires max verbal cues.     Time 3   Period Weeks   Status New     PT SHORT TERM GOAL #2   Title Pt will improve gait speed to 2.28 ft/sec in order to indicate decreased fall risk and improved efficiency of gait.     Baseline 1.68 ft/sec on 12/07/15   Time 3   Period Weeks   Status New     PT SHORT TERM GOAL #3   Title Will assess BERG balance test and improve score by 4 points in order to indicate decreased fall risk.     Baseline unable ot assess due to to time constraints.     Time 3   Period Weeks   Status New     PT SHORT TERM GOAL #4   Title Pt will ambulate over varying indoor surfaces w/ LRAD at mod I level x 300' in order to indicate safe ambulation at home.     Baseline Pt is S at this time with [facility] and R  AFO   Time 3   Period Weeks   Status New     PT SHORT TERM GOAL #5   Title Pt will be able to verbalize understanding of CVA warning signs/risk factors in order to reduce time to seeking medical attention in case of future CVA.     Baseline dependent   Time  3   Period Weeks   Status New           PT Long Term Goals - 12/07/15 1918      PT LONG TERM GOAL #1   Title Pt will be independent with HEP in order to indicate improved functional mobility and decreased fall risk.  (Target date: 01/18/16)   Baseline has 2 exercises, max verbal cues    Time 6   Period Weeks   Status New     PT LONG TERM GOAL #2   Title Pt will improve BERG balance test by 8 points from baseline in order to indicate decreased fall risk.    Baseline unable to assess due to time constraint.    Time 6   Period Weeks   Status New     PT LONG TERM GOAL #3   Title Pt will improve gait speed to >2.62 ft/sec w/ LRAD in order to indicate pt is community ambulator.     Baseline 1.68 ft/sec on 12/07/15   Time 6   Period Weeks   Status New     PT LONG TERM GOAL #4   Title Pt will ambulate over varying outdoor surfaces (including grass, ramp, curb) x 500' w/ LRAD at mod I level in order to indicate safe return to community and leisure activities.     Baseline S over indoor surfaces with LBQC and R AFO   Time 6   Period Weeks     PT LONG TERM GOAL #5   Title Pt will perform floor transfer at mod I level in order to indicate safe fall recovery.     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  Pt was hospitalized from 10/25/15-11/12/15, receiving therapy from acute care as well as IP rehab stay for deficits.  Pt with R UE/LE weakness, decreased coordination and decreased safety awareness.  Note that he is limited due to  Medicaid pending with limited visits and is unable to return to work at this time.  Upon PT evaluation, note that gait speed is 1.68 ft/sec with use of R walk on AFO and LBQC.  Pt states that he sometimes ambulates at home without brace, therefore assessed this for safety.  Note increased R ankle instability as well as increased R knee recurvatum therefore recommend continued brace wear when ambulatory at this time.  Pt is of evolving presentation and moderate complexity based on PT POC.  Pt will benefit from skilled OP neuro PT in order to address defiicts.      Rehab Potential Good   Clinical Impairments Affecting Rehab Potential Medicaid visit limitations   PT Frequency 2x / week   PT Duration 6 weeks   PT Treatment/Interventions ADLs/Self Care Home Management;Electrical Stimulation;DME Instruction;Gait training;Stair training;Functional mobility training;Therapeutic activities;Therapeutic exercise;Balance training;Neuromuscular re-education;Patient/family education;Orthotic Fit/Training;Vestibular;Energy conservation   PT Next Visit Plan Perform BERG, add to HEP for RLE strengthening, gait with LRAD, RLE NMR   Consulted and Agree with Plan of Care Patient;Family member/caregiver   Family Member Consulted Daughter Brad Tran       Patient will benefit from skilled therapeutic intervention in order to improve the following deficits and impairments:  Abnormal gait, Decreased balance, Decreased coordination, Decreased  knowledge of precautions, Decreased knowledge of use of DME, Decreased mobility, Decreased safety awareness, Decreased strength, Impaired perceived functional ability, Improper body mechanics, Postural dysfunction  Visit Diagnosis: Hemiplegia and hemiparesis following cerebral infarction affecting right dominant side (HCC) - Plan: PT plan of care cert/re-cert  Unsteadiness on feet - Plan: PT plan of care cert/re-cert  Other abnormalities of gait and mobility - Plan: PT plan of care  cert/re-cert     Problem List Patient Active Problem List   Diagnosis Date Noted  . Right hemiparesis (HCC)   . Myofascial muscle pain   . 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 Prediabetes   . Stroke (HCC) 10/25/2015  . CVA (cerebral infarction) 10/25/2015  . Stroke (cerebrum) (HCC) 10/25/2015  . Essential hypertension 10/25/2015  . Hyperlipidemia 10/25/2015  . Acute ischemic stroke (HCC)   . Cerebral infarction due to unspecified mechanism   . Dysarthria     Harriet ButteEmily Tianni Escamilla, PT, MPT [facility] [address] [place], [place], [address] Phone: [phone]   Fax:  [phone] 12/07/15, 7:25 PM  Name: Donita BrooksChhoun Blaustein MRN: [id number] Date of Birth: 12/08/1965

## 2015-12-07 NOTE — Patient Instructions (Signed)
Knee Flexion: Sitting (Single Leg)    Sit on edge of chair with shoe off (have sock on or place foot on towel) and pull right leg under you as far as you can and then push out.   Repeat _10_ times per set. Repeat with other leg. Do _2_ sets per session. Do _5-7_ sessions per week.   http://tub.exer.us/53   Functional Quadriceps: Sit to Stand    Sit on edge of chair, feet flat on floor. Stand upright, extending knees fully.  Sit chair in front of a mirror so that you can see yourself when you stand.  Focus on putting equal weight on R and L leg when you stand.   Repeat __10__ times per set. Do _2___ sets per session. Do _2___ sessions per day.  http://orth.exer.us/734   Copyright  VHI. All rights reserved.    Copyright  VHI. All rights reserved.

## 2015-12-07 NOTE — Therapy (Signed)
[facility] [address] [place], [place], [address] Phone: [phone]   Fax:  [phone]  Occupational Therapy Evaluation  Patient Details  Name: Brad Tran MRN: [id number] Date of Birth: March 30, 1966 Referring Provider: Dr. [name]  Encounter Date: 12/07/2015      OT End of Session - 12/07/15 1658    Visit Number 1   Number of Visits 17   Date for OT Re-Evaluation 02/06/16   Authorization Type Medicaid pending; unsure of # of home health visits used   OT Start Time [phone]   OT Stop Time 1018   OT Time Calculation (min) 45 min   Activity Tolerance Patient tolerated treatment well   Behavior During Therapy [facility] for tasks assessed/performed      No past medical history on file.  No past surgical history on file.  There were no vitals filed for this visit.      Subjective Assessment - 12/07/15 0937    Subjective  Pt reports that he doesn't have but 1-2 exercises for RUE   Patient is accompained by: Family member  dtr Brad Tran   Pertinent History small chronic infarct L thalamus, acute L basal ganglia nonhemorrhagic infarct   Limitations fall risk   Patient Stated Goals be able to use RUE   Currently in Pain? No/denies           [facility] OT Assessment - 12/07/15 0939      Assessment   Diagnosis CVA (other cerebral infarction I63.8)   Referring Provider Dr. 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     ADL   Eating/Feeding Set up  using L hand   Grooming Modified independent  LUE   Upper Body Bathing Modified independent   Lower Body Bathing Modified independent   Upper Body Dressing Increased time  difficulty, without fasteners   Lower Body Dressing Modified independent  difficulty, without fasteners   Toilet Tranfer Modified independent   Toileting - Clothing Manipulation Modified independent   Toileting -  Hygiene Modified Independent   Tub/Shower Transfer Modified independent  standing some, sitting some   Biomedical engineer seat without back   ADL comments Pt using LUE for all ADLs and is not currently able to use dominant RUE functionally     IADL   Prior Level of Function Light Housekeeping Pt performing some light tasks mod I with LUE   Prior Level of Function Meal Prep dtr bring meals, pt warming things in microwave (did some cooking prior)   Prior Level of Function Community Mobility independent   Community Mobility Relies on family or friends for transportation   Medication Management Is responsible for taking medication in correct dosages at correct time     Written Expression   Dominant Hand Right   Handwriting --  unable  Vision - History   Baseline Vision Wears glasses for distance only  only driving, watching tv     Cognition   Overall Cognitive Status Within Functional Limits for tasks assessed  per pt/dtr difficulty with wordfinding in English only     Sensation   Light Touch Appears Intact   Hot/Cold Appears Intact   Additional Comments per pt report     Coordination   Other Pt is unable to use RUE functionally at this time     Edema   Edema mild-mod edema noted in R hand     Tone   Assessment Location Right Upper Extremity     ROM / Strength   AROM / PROM / Strength AROM;PROM     AROM   Overall AROM  Deficits  RUE   Overall AROM Comments RUE:  approx 30*  AAROM shoulder flex, approx 25% shoulder abduction in flexor synergy pattern, approx 50-75% elbow flex (synergy pattern), able to initiate trace elbow ext, finger flex, and wrist ext, no active finger ext, approx 50% supination supported     PROM   Overall PROM  Within functional limits for tasks performed  RUE      Strength   Overall Strength Deficits   Overall Strength Comments RUE--see AROM      Hand Function   Right Hand Grip (lbs) only trace finger flex     RUE Tone   RUE Tone Modified Ashworth     RUE Tone   Modified Ashworth Scale for Grading Hypertonia RUE Slight increase in muscle tone, manifested by a catch, followed by minimal resistance throughout the remainder (less than half) of the ROM  to 2 (particularly with elbow ext)                         OT Education - 12/07/15 1656    Education provided Yes   Education Details Edema management; initial HEP--see pt instructions   Person(s) Educated Patient;Child(ren)   Methods Explanation;Demonstration;Verbal cues;Handout   Comprehension Verbalized understanding;Returned demonstration;Verbal cues required          OT Short Term Goals - 12/07/15 1708      OT SHORT TERM GOAL #1   Title Pt will be independent with initial HEP.--check STGs 01/07/16   Baseline reports only 1-2 exercises for RUE from home health   Time 4   Period Weeks   Status New     OT SHORT TERM GOAL #2   Title Pt will verbalize understanding of edema management techniques and proper positioning of RUE to prevent pain/injury.   Baseline dependent, min-mod edema in R hand   Time 4   Period Weeks   Status New     OT SHORT TERM GOAL #3   Title Pt will be independent with splint wear/care prn for improved positioning.   Baseline no splint   Time 4   Period Weeks   Status New     OT SHORT TERM GOAL #4   Title Pt will demo at least 20* R shoulder flex in prep for functional reach.   Baseline no true active shoulder flex, approx  30* AAROM   Time 4   Period Weeks   Status New     OT SHORT TERM GOAL #5   Title Pt will be demo at least 25% gross finger flex in prep for functional grasp.   Baseline trace   Time 4   Period Weeks   Status New  Additional Short Term Goals   Additional Short Term Goals Yes     OT SHORT TERM GOAL #6   Title Pt will use RUE as stabilizer for selected tasks.   Baseline unable to use RUE functionally   Time 4   Period Weeks   Status New           OT Long Term Goals - 12/07/15 1713      OT LONG TERM GOAL #1   Title Pt will be independent with updated HEP--check LTGs 02/06/16   Baseline dependent   Time 8   Period Weeks   Status New     OT LONG TERM GOAL #2   Title Pt will perform simple cooking task mod I.   Baseline only warming food in microwave currently (family prepares)   Time 8   Period Weeks   Status New     OT LONG TERM GOAL #3   Title Pt will demo at least 30* R shoulder flex in prep for functional reach.   Baseline no true active shoulder flex, approx 30* AAROM   Time 8   Period Weeks   Status New     OT LONG TERM GOAL #4   Title Pt will use RUE as gross assist for selected tasks.   Baseline unable to use RUE functionally.   Time 8   Period Weeks   Status New     OT LONG TERM GOAL #5   Title Pt will be able to perform light weightbearing through RUE without pain to assist in transfers/IADLs.   Baseline unable to use RUE functionally.   Time 8   Period Weeks   Status New               Plan - 12/07/15 1659    Clinical Impression Statement Pt is a 50 y.o. male with diagnosis of CVA with R dominant-side hemiplegia (other cerebral infarction--I63.8) with hospitalization 10/25/15-11/12/15.  Pt with no significant PMH.  Pt was independent and working prior to CVA.  Pt presents with R hemiplegia with decr AROM, decr strength, decr coordination, and spasticity as well as decr balance for ADLs.  Pt would benefit from occupational therapy to  improve dominant RUE functional use and improve ADL/IADL performance/independence,   Rehab Potential Good   OT Frequency 2x / week   OT Duration 8 weeks  + evaluation recommended; however, may need to modify due to pt's financial concerns and possible medicaid visit limitations (medicaid pending)   OT Treatment/Interventions Self-care/ADL training;Fluidtherapy;DME and/or AE instruction;Moist Heat;Splinting;Patient/family education;Therapeutic exercises;Contrast Bath;Ultrasound;Therapeutic exercise;Therapeutic activities;Passive range of motion;Functional Mobility Training;Neuromuscular education;Cryotherapy;Electrical Stimulation;Parrafin;Manual Therapy;Energy conservation   Plan review and add to HEP, ?custom splint for positioning (for nighttime)   OT Home Exercise Plan Education provided:  12/07/15 edema managment, initial HEP   Recommended Other Services PT evaluation today   Consulted and Agree with Plan of Care Patient;Family member/caregiver   Family Member Consulted daughter      Patient will benefit from skilled therapeutic intervention in order to improve the following deficits and impairments:  Decreased mobility, Decreased strength, Impaired UE functional use, Decreased knowledge of use of DME, Impaired tone, Increased edema, Decreased coordination, Decreased range of motion, Decreased balance  Visit Diagnosis: Hemiplegia and hemiparesis following cerebral infarction affecting right dominant side (HCC)  Other lack of coordination  Unsteadiness on feet    Problem List Patient Active Problem List   Diagnosis Date Noted  . Right hemiparesis (HCC)   . Myofascial muscle pain   .  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 Prediabetes   . Stroke (HCC) 10/25/2015  . CVA (cerebral infarction) 10/25/2015  . Stroke (cerebrum) (HCC) 10/25/2015  . Essential hypertension 10/25/2015  . Hyperlipidemia 10/25/2015  . Acute ischemic stroke (HCC)   .  Cerebral infarction due to unspecified mechanism   . Dysarthria     [facility] Center[facility]REEMAN,ANGELA 12/07/2015, 5:18 PM  [facility] [address] [place], [place], [address] Phone: [phone]   Fax:  [phone]  Name: Brad Tran MRN: [id number] Date of Birth: 12/24/1965   Willa FraterAngela Freeman, OTR/L [facility] [address] [place], [place]  [address] [phone] phone [phone] 12/07/15 5:18 PM

## 2015-12-07 NOTE — Patient Instructions (Signed)
   Do these exercises 2 times/day.  1.  Bend fingers of your right hand with the other hand (bend all 3 joints).  Hold 5 sec.  Then straighten fingers/thumb.  Perform 10 times  2.  Sit and clasp hands together.  Reach down to the floor stretching out your elbow.  Hold 5 sec.  10 times.  3.  Place right hand on towel on the table.  Place left hand on top and slide towel straight out in front of you slowly 10 times.  Then move towel side to side 10 times.  Then make big circles 10 times each direction.  4.  Elevate you arm on a pillow when sitting or laying down.  Place pillow under elbow/hand so that hand does not hang down.  5. Massage your fingers/hand by starting at fingertips and rub from fingertips up your arm to push fluid away from the hand.

## 2015-12-17 ENCOUNTER — Encounter: Payer: Self-pay | Admitting: Family Medicine

## 2015-12-17 ENCOUNTER — Ambulatory Visit (INDEPENDENT_AMBULATORY_CARE_PROVIDER_SITE_OTHER): Payer: Self-pay | Admitting: Family Medicine

## 2015-12-17 VITALS — BP 136/93 | HR 90 | Temp 98.5°F | Ht 64.0 in | Wt 168.0 lb

## 2015-12-17 DIAGNOSIS — I10 Essential (primary) hypertension: Secondary | ICD-10-CM

## 2015-12-17 NOTE — Patient Instructions (Signed)
Come back when you get your medicaid card Continue current medications.

## 2015-12-22 ENCOUNTER — Encounter: Payer: Self-pay | Admitting: Physical Medicine & Rehabilitation

## 2015-12-22 ENCOUNTER — Ambulatory Visit (HOSPITAL_BASED_OUTPATIENT_CLINIC_OR_DEPARTMENT_OTHER): Payer: Self-pay | Admitting: Physical Medicine & Rehabilitation

## 2015-12-22 ENCOUNTER — Encounter: Payer: Medicaid Other | Attending: Physical Medicine & Rehabilitation

## 2015-12-22 VITALS — BP 147/91 | HR 72 | Resp 14

## 2015-12-22 DIAGNOSIS — I10 Essential (primary) hypertension: Secondary | ICD-10-CM | POA: Insufficient documentation

## 2015-12-22 DIAGNOSIS — I69354 Hemiplegia and hemiparesis following cerebral infarction affecting left non-dominant side: Secondary | ICD-10-CM | POA: Diagnosis not present

## 2015-12-22 DIAGNOSIS — Z5189 Encounter for other specified aftercare: Secondary | ICD-10-CM | POA: Insufficient documentation

## 2015-12-22 DIAGNOSIS — E785 Hyperlipidemia, unspecified: Secondary | ICD-10-CM | POA: Diagnosis not present

## 2015-12-22 DIAGNOSIS — I69359 Hemiplegia and hemiparesis following cerebral infarction affecting unspecified side: Secondary | ICD-10-CM

## 2015-12-22 NOTE — Patient Instructions (Signed)
May return to limited, part time work on lifting or carrying objects may do some sedentary work

## 2015-12-22 NOTE — Progress Notes (Signed)
Subjective:    Patient ID: Brad Tran, male    DOB: [birth year], 50 y.o.   MRN: [id number] 50 year old right-handed limited English-speaking male unremarkable past medical history on no scheduled medication.  He lives with family independent prior to admission. Presented October 25, 2015, right-sided weakness, slurred speech.  Urine drug screen negative.  Cranial CT scan no acute changes.  Small chronic infarct left thalamus.  MRI of the brain showed acute left basal ganglia, nonhemorrhagic infarct.  MRA with no emergent large vessel occlusion or stenosis.  Carotid Dopplers with no ICA stenosis.  The patient did not receive tPA.  Maintained on aspirin for CVA prophylaxis.  Comprehensive inpatient rehabilitation at [facility] stroke program DATE OF ADMISSION:  10/27/2015 DATE OF DISCHARGE:  11/12/2015  HPI  PT,OT HH finished Has started outpatient therapy. However, further treatments are being held pending. Medicaid approval Now has primary care physician No other new issues. No falls. Has not return to work yet.  Still using a quad cane, as well as right AFO for ambulation. Needs assistance from daughter for certain dressing tasks    Pain Inventory Average Pain 5 Pain Right Now 0 My pain is aching  In the last 24 hours, has pain interfered with the following? General activity 6 Relation with others 7 Enjoyment of life 7 What TIME of day is your pain at its worst? morning Sleep (in general) Good  Pain is worse with: bending and some activites Pain improves with: rest, heat/ice and other Relief from Meds: 1  Mobility use a cane how many minutes can you walk? 60 do you drive?  no  Function disabled: date disabled 10/25/2015  Neuro/Psych No problems in this area  Prior Studies Any changes since last visit?  no  Physicians involved in your care Any changes since last visit?  no   Family History  Problem Relation Age of Onset  . Stroke Father   . Heart attack  Father    Social History   Social History  . Marital status: Divorced    Spouse name: N/A  . Number of children: N/A  . Years of education: N/A   Social History Main Topics  . Smoking status: Never Smoker  . Smokeless tobacco: Never Used  . Alcohol use Yes     Comment: A few, some days  . Drug use: No  . Sexual activity: Not Asked   Other Topics Concern  . None   Social History Narrative  . None   No past surgical history on file. Past Medical History:  Diagnosis Date  . Hyperlipidemia   . Hypertension   . Stroke (HCC)    BP (!) 147/91 (BP Location: Left Arm, Patient Position: Sitting, Cuff Size: Normal)   Pulse 72   Resp 14   SpO2 96%   Opioid Risk Score:   Fall Risk Score:  `1  Depression screen PHQ 2/9  Depression screen [facility] 2/9 12/22/2015 11/23/2015  Decreased Interest 0 0  Down, Depressed, Hopeless 0 0  PHQ - 2 Score 0 0  Altered sleeping - 0  Tired, decreased energy - 1  Change in appetite - 0  Feeling bad or failure about yourself  - 0  Trouble concentrating - 1  Moving slowly or fidgety/restless - 1  Suicidal thoughts - 0  PHQ-9 Score - 3  Difficult doing work/chores - Not difficult at all    Review of Systems  Constitutional: Negative.   HENT: Negative.   Eyes: Negative.  Respiratory: Negative.   Cardiovascular: Negative.   Gastrointestinal: Negative.   Endocrine: Negative.   Genitourinary: Negative.   Musculoskeletal: Positive for gait problem.  Skin: Negative.   Allergic/Immunologic: Negative.   Hematological: Negative.   Psychiatric/Behavioral: Negative.   All other systems reviewed and are negative.      Objective:   Physical Exam  Constitutional: He is oriented to person, place, and time. He appears well-developed and well-nourished.  HENT:  Head: Normocephalic and atraumatic.  Eyes: Conjunctivae and EOM are normal. Pupils are equal, round, and reactive to light.  Neck: Normal range of motion.  Cardiovascular:  No murmur  heard. Pulmonary/Chest: No respiratory distress. He has no wheezes.  Abdominal: He exhibits no distension. There is no tenderness.  Neurological: He is alert and oriented to person, place, and time. No sensory deficit. He exhibits abnormal muscle tone. Coordination and gait abnormal.  Motor strength is 3 minus in the right deltoid, biceps, triceps, 1/5. Right thumb flexor, right index finger flexor, as well as right fifth finger flexor, 0/5, finger extensors  Motor strength is 4 at the right hip flexors 4+ in the knee extensors, trace ankle dorsiflexion and plantarflexion, absent toe flexion and toe extension  Tone Ashworth 2 at the right biceps, 0 at the finger flexors and wrist flexors.  Nursing note and vitals reviewed.         Assessment & Plan:  1. 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 Recommend outpatient PT, OT. Given that he still has very limited strength in the distal right upper and lower limb.He is waiting for Medicaid approval and we'll hold off on further treatments until that time  Physical medicine and rehabilitation follow-up in 4 weeks  Continue aspirin 325 mg for stroke prophylaxis  2. Hypertension: Continue metoprolol 50 mg twice a day Follow-up with nurse practitioner Marcell BarlowBarnhart. Internal medicine  3. Hyperlipidemia. Continue Lipitor 80 mg per day, as per internal medicine/PCP

## 2015-12-22 NOTE — Progress Notes (Signed)
Brad Tran, is a 50 y.o. male  ZOX:[id number]:[id number]  WJX:[id number]  DOB - 07/10/1965  CC:  Chief Complaint  Patient presents with  . New Patient (Initial Visit)    right hand discomfort comes and goes, has applied for Medicaid and has stopped PT until he gets coverage       HPI: Brad Flossie BuffyReth is a 50 y.o. male here to establish care. He has a history of hypertension, hyperlipidemia, pre-daibetes and has had a CVA in July. Has been in PT but has stopped due to lack of funds. Has applied and waiting for Medicaid. His medications are Lopressor, atorvastatin, and ASA. He does not need any refills today. A family member is here with him to help provide information. He reports doing well except for this bilateral weakness from the CVA. They wish to delay health maintenance issues until he has medicaid. He soes need colon cancer and prostate cancer screening. He reports eating a heart healthy diet, but getting little exercise.  No Known Allergies Past Medical History:  Diagnosis Date  . Hyperlipidemia   . Hypertension   . Stroke [facility](HCC)    Current Outpatient Prescriptions on File Prior to Visit  Medication Sig Dispense Refill  . aspirin EC 325 MG tablet Take 1 tablet (325 mg total) by mouth daily. 100 tablet 0  . atorvastatin (LIPITOR) 80 MG tablet Take 1 tablet (80 mg total) by mouth daily at 6 PM. 34 tablet 0  . metoprolol (LOPRESSOR) 50 MG tablet Take 1 tablet (50 mg total) by mouth 2 (two) times daily. 68 tablet 0  . Menthol-Methyl Salicylate (MUSCLE RUB) 10-15 % CREA Apply 1 application topically 2 (two) times daily as needed (to right shoulder for pain/muscle soreness). (Patient not taking: Reported on 11/23/2015) 113 g 0   No current facility-administered medications on file prior to visit.    Family History  Problem Relation Age of Onset  . Stroke Father   . Heart attack Father    Social History   Social History  . Marital status: Divorced    Spouse name: N/A  . Number of children:  N/A  . Years of education: N/A   Occupational History  . Not on file.   Social History Main Topics  . Smoking status: Never Smoker  . Smokeless tobacco: Never Used  . Alcohol use Yes     Comment: A few, some days  . Drug use: No  . Sexual activity: Not on file   Other Topics Concern  . Not on file   Social History Narrative  . No narrative on file    Review of Systems: Constitutional: Negative Skin: Negative HENT: Negative  Eyes: Negative  Neck: Negative Respiratory: Negative Cardiovascular: Negative Gastrointestinal: Negative Genitourinary: Negative  Musculoskeletal: Positive for right-sided weakness Neurological: Negative for Hematological: Negative  Psychiatric/Behavioral: Negative    Objective:   Vitals:   12/17/15 1107  BP: (!) 136/93  Pulse: 90  Temp: 98.5 F (36.9 C)    Physical Exam: Constitutional: Patient appears well-developed and well-nourished. No distress. HENT: Normocephalic, atraumatic, External right and left ear normal. Oropharynx is clear and moist.  Eyes: Conjunctivae and EOM are normal. PERRLA, no scleral icterus. Neck: Normal ROM. Neck supple. No lymphadenopathy, No thyromegaly. CVS: RRR, S1/S2 +, no murmurs, no gallops, no rubs Pulmonary: Effort and breath sounds normal, no stridor, rhonchi, wheezes, rales.  Abdominal: Soft. Normoactive BS,, no distension, tenderness, rebound or guarding.  Musculoskeletal: Normal range of motion. No edema and no tenderness.  Neuro: Positive for right sided weakness Skin: Skin is warm and dry. No rash noted. Not diaphoretic. No erythema. No pallor. Psychiatric: Normal mood and affect. Behavior, judgment, thought content normal.  Lab Results  Component Value Date   WBC 5.8 10/28/2015   HGB 17.0 10/28/2015   HCT 49.2 10/28/2015   MCV 85.0 10/28/2015   PLT 188 10/28/2015   Lab Results  Component Value Date   CREATININE 0.90 11/10/2015   BUN 19 10/28/2015   NA 139 10/28/2015   K 4.8 10/28/2015    CL 105 10/28/2015   CO2 27 10/28/2015    Lab Results  Component Value Date   HGBA1C 5.7 (H) 10/25/2015   Lipid Panel     Component Value Date/Time   CHOL 269 (H) 10/25/2015 0747   TRIG 269 (H) 10/25/2015 0747   HDL 39 (L) 10/25/2015 0747   CHOLHDL 6.9 10/25/2015 0747   VLDL 54 (H) 10/25/2015 0747   LDLCALC 176 (H) 10/25/2015 0747       Assessment and plan:   1. Essential hypertension -Continue current medications.    Return in about 3 months (around 03/17/2016).  The patient was given clear instructions to go to ER or return to medical center if symptoms don't improve, worsen or new problems develop. The patient verbalized understanding.    Henrietta Hoover FNP  12/22/2015, 8:30 AM

## 2016-01-07 ENCOUNTER — Encounter: Payer: Self-pay | Admitting: Neurology

## 2016-01-07 ENCOUNTER — Ambulatory Visit (INDEPENDENT_AMBULATORY_CARE_PROVIDER_SITE_OTHER): Payer: Medicaid Other | Admitting: Neurology

## 2016-01-07 ENCOUNTER — Telehealth: Payer: Self-pay | Admitting: Neurology

## 2016-01-07 VITALS — BP 132/90 | HR 57 | Ht 64.0 in | Wt 168.0 lb

## 2016-01-07 DIAGNOSIS — I63312 Cerebral infarction due to thrombosis of left middle cerebral artery: Secondary | ICD-10-CM

## 2016-01-07 DIAGNOSIS — I10 Essential (primary) hypertension: Secondary | ICD-10-CM | POA: Diagnosis not present

## 2016-01-07 DIAGNOSIS — E785 Hyperlipidemia, unspecified: Secondary | ICD-10-CM

## 2016-01-07 DIAGNOSIS — G8191 Hemiplegia, unspecified affecting right dominant side: Secondary | ICD-10-CM

## 2016-01-07 NOTE — Telephone Encounter (Signed)
Yes. No problem. The TCD is not urgent. We can wait until his medicaid approves. Thanks.  Marvel PlanJindong Elenore Wanninger, MD PhD Stroke Neurology 01/07/2016 5:00 PM

## 2016-01-07 NOTE — Patient Instructions (Addendum)
-   continue ASA and lipitor for stroke prevention - will do blood draw today - will do TCD bubble study and emboli detection - Follow up with your primary care physician for stroke risk factor modification. Recommend maintain blood pressure goal <130/80, diabetes with hemoglobin A1c goal below 7.0% and lipids with LDL cholesterol goal below 70 mg/dL.  - check BP at home  - aggressive home active exercise  - follow up in 3 months.

## 2016-01-07 NOTE — Telephone Encounter (Signed)
Coverages for This Account    Coverage ID Payor Plan Insurance ID  [id number] MEDICAID POTENTIAL MEDICAID POTENTIAL   [id number] MEDICAID PENDING MEDICAID PENDING   [id number] MEDICAID PENDING MEDICAID PENDING    [facility]. [facility]  Dr. Roda ShuttersXu  Would you like me to Run this [facility] . This could delay Dopplers is this ok ? Maybe 30 days day's or so. Please advise me. Thanks Annabelle Harmanana.

## 2016-01-07 NOTE — Progress Notes (Signed)
STROKE NEUROLOGY FOLLOW UP NOTE  NAME: Brad Tran DOB: Aug 24, 1965  REASON FOR VISIT: stroke follow up HISTORY FROM: Patient and chart  Today we had the pleasure of seeing Brad Tran in follow-up at our Neurology Clinic. Pt was accompanied by daughter.   History Summary Brad. Brad Tran is a 50 y.o. male with no significant past medical history but also not see doctor for years admitted on 10/25/2015 for dysarthria, gait disturbance, and falls. MRI showed acute left BG/CR infarct with old left thalamic lacunar infarct. MRA, CUS and TTE unremarkable. LDL 176 and A1c 5.7. He was discharged to CIR with full dose aspirin and Lipitor 80. Due to large size of infarct, 30 day cardiac event monitoring was recommended as outpatient.  Interval History During the interval time, the patient has been doing well. still has right hemiparesis, arm > leg. Finished home PT/OT, however waiting for Medicaid improvement to start outpatient PT/OT. Able to walk with cane without assistance. Follow up with PCP, put on metoprolol for blood pressure control. At home BP 130 - 140, today BP in clinic in 132/90. 30 day cardiac event monitoring has not been performed. Declined RESPECT ESUS trial.   REVIEW OF SYSTEMS: Full 14 system review of systems performed and notable only for those listed below and in HPI above, all others are negative:  Constitutional:   Cardiovascular:  Ear/Nose/Throat:   Skin:  Eyes:   Respiratory:   Gastroitestinal:  Constipation  Genitourinary:  Hematology/Lymphatic:   Endocrine:  Musculoskeletal:  Aching muscles  Allergy/Immunology:   Neurological:  Slurry speech  Psychiatric: Decreased energy  Sleep:   The following represents the patient's updated allergies and side effects list: No Known Allergies  The neurologically relevant items on the patient's problem list were reviewed on today's visit.  Neurologic Examination  A problem focused neurological exam (12 or more points of  the single system neurologic examination, vital signs counts as 1 point, cranial nerves count for 8 points) was performed.  Blood pressure 132/90, pulse (!) 57, height 5\' 4"  (1.626 m), weight 168 lb (76.2 kg).  General - Well nourished, well developed, in no apparent distress.  Ophthalmologic - Sharp disc margins OU.  Cardiovascular - Regular rate and rhythm with no murmur.  Mental Status -  Level of arousal and orientation to time, place, and person were intact. Language including expression, naming, repetition, comprehension was assessed and found intact. Fund of Knowledge was assessed and was intact.  Cranial Nerves II - XII - II - Visual field intact OU. III, IV, VI - Extraocular movements intact. V - Facial sensation intact bilaterally. VII - right facial droop. VIII - Hearing & vestibular intact bilaterally. X - Palate elevates symmetrically. XI - Chin turning & shoulder shrug intact bilaterally. XII - Tongue protrusion intact.  Motor Strength - The patient's strength was 3+/5 RUE deltoid, bicep and tricep, 2/5 finger movement, RLE proximal 4/5, distal foot drop on AFO.  Bulk was normal and fasciculations were absent.   Motor Tone - Muscle tone was assessed at the neck and appendages and was normal.  Reflexes - The patient's reflexes were 1+ in all extremities and he had no pathological reflexes.  Sensory - Light touch, temperature/pinprick were assessed and were normal.    Coordination - The patient had normal movements in the hands with no ataxia or dysmetria.  Tremor was absent.  Gait and Station - walk with cane, right hemiparetic gait.   Functional score  mRS = 3  0 - No symptoms.   1 - No significant disability. Able to carry out all usual activities, despite some symptoms.   2 - Slight disability. Able to look after own affairs without assistance, but unable to carry out all previous activities.   3 - Moderate disability. Requires some help, but able to  walk unassisted.   4 - Moderately severe disability. Unable to attend to own bodily needs without assistance, and unable to walk unassisted.   5 - Severe disability. Requires constant nursing care and attention, bedridden, incontinent.   6 - Dead.   NIH Stroke Scale   Level Of Consciousness 0=Alert; keenly responsive 1=Not alert, but arousable by minor stimulation 2=Not alert, requires repeated stimulation 3=Responds only with reflex movements 0  LOC Questions to Month and Age 75=Answers both questions correctly 1=Answers one question correctly 2=Answers neither question correctly 0  LOC Commands      -Open/Close eyes     -Open/close grip 0=Performs both tasks correctly 1=Performs one task correctly 2=Performs neighter task correctly 0  Best Gaze 0=Normal 1=Partial gaze palsy 2=Forced deviation, or total gaze paresis 0  Visual 0=No visual loss 1=Partial hemianopia 2=Complete hemianopia 3=Bilateral hemianopia (blind including cortical blindness) 0  Facial Palsy 0=Normal symmetrical movement 1=Minor paralysis (asymmetry) 2=Partial paralysis (lower face) 3=Complete paralysis (upper and lower face) 2  Motor  0=No drift, limb holds posture for full 10 seconds 1=Drift, limb holds posture, no drift to bed 2=Some antigravity effort, cannot maintain posture, drifts to bed 3=No effort against gravity, limb falls 4=No movement Right Arm 1     Leg 1    Left Arm 0     Leg 0  Limb Ataxia 0=Absent 1=Present in one limb 2=Present in two limbs 0  Sensory 0=Normal 1=Mild to moderate sensory loss 2=Severe to total sensory loss 0  Best Language 0=No aphasia, normal 1=Mild to moderate aphasia 2=Mute, global aphasia 3=Mute, global aphasia 0  Dysarthria 0=Normal 1=Mild to moderate 2=Severe, unintelligible or mute/anarthric 0  Extinction/Neglect 0=No abnormality 1=Extinction to bilateral simultaneous stimulation 2=Profound neglect 0  Total   4     Data reviewed: I personally  reviewed the images and agree with the radiology interpretations.  Brad Tran Head/brain Wo Cm 10/25/2015   MRI HEAD:  Acute LEFT basal ganglia nonhemorrhagic infarct.  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  MRA HEAD:  No emergent large vessel occlusion or severe stenosis. Mild luminal irregularity of the cerebral arteries compatible with atherosclerosis.   Ct Head W/o Cm 10/25/2015   1. No acute intracranial pathology seen on CT.  2. 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  3. Small mucus retention cyst or polyp at the right maxillary sinus.   CUS - There is no obvious evidence of hemodynamically significant internal carotid artery stenosis bilaterally. Vertebral arteries are patent with antegrade flow.  TTE - Left ventricle: The cavity size was normal. There was mild   concentric hypertrophy. Systolic function was normal. The   estimated ejection fraction was in the range of 60% to 65%. Wall   motion was normal; there were no regional wall motion   abnormalities. Doppler parameters are consistent with abnormal   left ventricular relaxation (grade 1 diastolic dysfunction). - Mitral valve: Calcified annulus. Impressions: - No cardiac source of emboli was indentified.  Component     Latest Ref Rng & Units 10/25/2015  Cholesterol     0 - 200 mg/dL 161 (H)  Triglycerides     <150 mg/dL 096 (H)  HDL  Cholesterol     >40 mg/dL 39 (L)  Total CHOL/HDL Ratio     RATIO 6.9  VLDL     0 - 40 mg/dL 54 (H)  LDL (calc)     0 - 99 mg/dL 161176 (H)  Hemoglobin [id number]     4.8 - 5.6 % 5.7 (H)  Mean Plasma Glucose     mg/dL 045117    Assessment: As you may recall, he is a 50 y.o. Asian male with PMH of no significant past medical history but also not see doctor for years admitted on 10/25/2015 for dysarthria, gait disturbance, and falls. MRI showed acute left BG/CR infarct with old left thalamic lacunar infarct. MRA, CUS and TTE unremarkable. LDL 176 and A1c 5.7. He was discharged to CIR with full dose  aspirin and Lipitor 80. Due to large size of infarct, 30 day cardiac event monitoring was recommended as outpatient but not done yet. Will do TCD bubble study and emboli detection, as well as hypercoagulable labs. He still has right hemiparesis, arm > leg. Currently on metoprolol for BP control. Declined RESPECT ESUS trial.   Plan:  - continue ASA and lipitor for stroke prevention - Check hypercoagulable/autoimmune labs due to young age - will do TCD bubble study and emboli detection for stroke workup - Will hold off 30 day cardiac event monitoring for now due to lack of insurance. - Follow up with your primary care physician for stroke risk factor modification. Recommend maintain blood pressure goal <130/80, diabetes with hemoglobin A1c goal below 7.0% and lipids with LDL cholesterol goal below 70 mg/dL.  - check BP at home  - aggressive home active exercise  - follow up in 3 months.   I spent more than 25 minutes of face to face time with the patient. Greater than 50% of time was spent in counseling and coordination of care. We discussed further stroke workup, aggressive home exercises, and follow up with Medicaid approval.   Orders Placed This Encounter  Procedures  . [facility]    Standing Status:   Future    Standing Expiration Date:   07/09/2016    Order Specific Question:   Reason for Exam (SYMPTOM  OR DIAGNOSIS REQUIRED)    Answer:   stroke    Order Specific Question:   Preferred imaging location?    Answer:   Internal  . US TRANSCRANIAL DOPPLER WITH BUBBLES    Standing Status:   Future    Standing Expiration Date:   03/08/2017    Order Specific Question:   Reason for Exam (SYMPTOM  OR DIAGNOSIS REQUIRED)    Answer:   stroke    Order Specific Question:   Preferred imaging location?    Answer:   Internal  . Hypercoagulable panel, comprehensive  . Systemic Lupus Profile A    No orders of the defined types were placed in this encounter.   Patient Instructions  -  continue ASA and lipitor for stroke prevention - will do blood draw today - will do TCD bubble study and emboli detection - Follow up with your primary care physician for stroke risk factor modification. Recommend maintain blood pressure goal <130/80, diabetes with hemoglobin A1c goal below 7.0% and lipids with LDL cholesterol goal below 70 mg/dL.  - check BP at home  - aggressive home active exercise  - follow up in 3 months.    Marvel PlanJindong Arth Nicastro, MD PhD [facility] [address], [address] [place], [place] [address] [phone]

## 2016-01-12 NOTE — Telephone Encounter (Signed)
Spoke to Patient's daughter and Patient just received his insurance card. Patient's daughter will bring to the office to have scanned in.

## 2016-01-13 ENCOUNTER — Telehealth: Payer: Self-pay

## 2016-01-13 MED ORDER — METOPROLOL TARTRATE 50 MG PO TABS: 50.0000 mg | ORAL_TABLET | Freq: Two times a day (BID) | ORAL | 3 refills | Status: DC

## 2016-01-13 MED ORDER — ASPIRIN EC 325 MG PO TBEC: 325.0000 mg | DELAYED_RELEASE_TABLET | Freq: Every day | ORAL | 0 refills | Status: DC

## 2016-01-13 MED ORDER — ATORVASTATIN CALCIUM 80 MG PO TABS: 80.0000 mg | ORAL_TABLET | Freq: Every day | ORAL | 3 refills | Status: DC

## 2016-01-13 MED FILL — ATORVASTATIN 80 MG TABLET: 80 | 30 days supply | Qty: 30 | Fill #0

## 2016-01-13 MED FILL — METOPROLOL TARTRATE 50 MG T: 50 | 30 days supply | Qty: 60 | Fill #0

## 2016-01-13 NOTE — Telephone Encounter (Signed)
Refills have been sent into pharmacy. Thanks!  

## 2016-01-15 LAB — HYPERCOAGULABLE PANEL, COMPREHENSIVE
APTT: 28.4 s
AT III Act/Nor PPP Chro: 123 %
Act. Prt C Resist w/FV Defic.: 2.7 ratio
Anticardiolipin Ab, IgG: <10 [GPL'U]
Anticardiolipin Ab, IgM: <10 [MPL'U]
Beta-2 Glycoprotein I, IgA: <10 SAU
Beta-2 Glycoprotein I, IgG: <10 SGU
Beta-2 Glycoprotein I, IgM: 13 SMU
DRVVT Screen Seconds: 44.5 s
Factor VII Antigen**: 122 %
Factor VIII Activity: 46 % — ABNORMAL LOW
Hexagonal Phospholipid Neutral: 8 s
Homocysteine: 9.3 umol/L
Prot C Ag Act/Nor PPP Imm: 103 %
Prot S Ag Act/Nor PPP Imm: 105 %
Protein C Ag/FVII Ag Ratio**: 0.8 ratio
Protein S Ag/FVII Ag Ratio**: 0.9 ratio

## 2016-01-15 LAB — SYSTEMIC LUPUS PROFILE A
ENA RNP AB: 0.2 AI (ref 0.0–0.9)
ENA SSA (RO) Ab: <0.2 AI (ref 0.0–0.9)
ENA SSB (LA) Ab: <0.2 AI (ref 0.0–0.9)
dsDNA Ab: 1 IU/mL (ref 0–9)

## 2016-01-18 ENCOUNTER — Ambulatory Visit: Payer: Medicaid Other | Admitting: Rehabilitation

## 2016-01-18 ENCOUNTER — Telehealth: Payer: Self-pay

## 2016-01-18 ENCOUNTER — Telehealth: Payer: Self-pay | Admitting: Neurology

## 2016-01-18 ENCOUNTER — Ambulatory Visit: Payer: Medicaid Other | Admitting: Occupational Therapy

## 2016-01-18 NOTE — Telephone Encounter (Signed)
Rn spoke with patient about lab work Rn stated his lab work was normal. Pt verbalized understanding. Rn explain each pt to lab.

## 2016-01-18 NOTE — Telephone Encounter (Signed)
-----   Message from Marvel PlanJindong Xu, MD sent at 01/17/2016  4:17 PM EDT ----- Could you please let the patient know that the blood tests done recently in our office was unremarkable. Please continue current treatment. Thanks.  Marvel PlanJindong Xu, MD PhD Stroke Neurology 01/17/2016 4:17 PM

## 2016-01-18 NOTE — Telephone Encounter (Signed)
Patient is ready to be scheduled for Doppler. Approval # [id number]. Good for 30 days exp. Date 02/17/2016. Thanks [employer].

## 2016-01-19 NOTE — Telephone Encounter (Signed)
Good job, Technical brewerDana.  Marvel PlanJindong Danniella Robben, MD PhD Stroke Neurology 01/19/2016 5:29 PM

## 2016-01-19 NOTE — Telephone Encounter (Signed)
Patient was approved for Doppler. Patient will be scheduled to have Dopplers done. Thanks Annabelle Harmanana.

## 2016-02-01 ENCOUNTER — Ambulatory Visit: Payer: Medicaid Other | Admitting: Rehabilitation

## 2016-02-01 ENCOUNTER — Ambulatory Visit: Payer: Medicaid Other | Attending: Physical Medicine & Rehabilitation | Admitting: Occupational Therapy

## 2016-02-01 DIAGNOSIS — R278 Other lack of coordination: Secondary | ICD-10-CM | POA: Insufficient documentation

## 2016-02-01 DIAGNOSIS — I69351 Hemiplegia and hemiparesis following cerebral infarction affecting right dominant side: Secondary | ICD-10-CM | POA: Diagnosis not present

## 2016-02-01 DIAGNOSIS — R2689 Other abnormalities of gait and mobility: Secondary | ICD-10-CM

## 2016-02-01 DIAGNOSIS — R2681 Unsteadiness on feet: Secondary | ICD-10-CM | POA: Insufficient documentation

## 2016-02-01 NOTE — Therapy (Signed)
[facility] [address] [place], [place], [address] Phone: [phone]   Fax:  [phone]  Physical Therapy Treatment  Patient Details  Name: Brad Tran MRN: [id number] Date of Birth: May 06, 1965 Referring Provider: [name], MD  Encounter Date: 02/01/2016      PT End of Session - 02/01/16 1234    Visit Number 2   Number of Visits 9  updated due to medicaid approval on 02/01/16   Date for PT Re-Evaluation 03/10/16  updated due to medicaid approval 02/01/16   Authorization Type Medicaid approved 8 visits from 01/29/16-03/10/16   Authorization - Visit Number 1   Authorization - Number of Visits 8   PT Start Time 0932   PT Stop Time 1015   PT Time Calculation (min) 43 min   Activity Tolerance Patient tolerated treatment well   Behavior During Therapy [facility] for tasks assessed/performed      Past Medical History:  Diagnosis Date  . Hyperlipidemia   . Hypertension   . Stroke [facility])     No past surgical history on file.  There were no vitals filed for this visit.      Subjective Assessment - 02/01/16 0940    Subjective "I don't walk with the cane all the time."    Patient is accompained by: Family member   Pertinent History Pronouned "Choo-un" , daughter April    Limitations Walking;House hold activities   Currently in Pain? No/denies                         [facility] Adult PT Treatment/Exercise - 02/01/16 0949      Standardized Balance Assessment   Standardized Balance Assessment Berg Balance Test     Berg Balance Test   Sit to Stand Able to stand without using hands and stabilize independently   Standing Unsupported Able to stand safely 2 minutes   Sitting with Back Unsupported but Feet Supported on Floor or Stool Able to sit safely and securely 2 minutes   Stand to Sit Sits safely with minimal use of hands   Transfers Able to transfer safely, minor use of hands   Standing  Unsupported with Eyes Closed Able to stand 10 seconds with supervision   Standing Ubsupported with Feet Together Able to place feet together independently and stand 1 minute safely   From Standing, Reach Forward with Outstretched Arm Can reach confidently >25 cm (10")   From Standing Position, Pick up Object from Floor Able to pick up shoe, needs supervision   From Standing Position, Turn to Look Behind Over each Shoulder Looks behind from both sides and weight shifts well   Turn 360 Degrees Able to turn 360 degrees safely but slowly   Standing Unsupported, Alternately Place Feet on Step/Stool Able to complete 4 steps without aid or supervision   Standing Unsupported, One Foot in Front Able to plae foot ahead of the other independently and hold 30 seconds   Standing on One Leg Tries to lift leg/unable to hold 3 seconds but remains standing independently   Total Score 46      NMR:  BERG balance test performed, see results above.  Educated pt on results and meaning during session.  Initiated HEP for RLE strengthening/NMR, see details in pt instructions on reps and exercises.            PT Education - 02/01/16 1234    Education provided Yes   Education Details Education on  HEP, see pt instruction for details.    Person(s) Educated Patient;Child(ren)   Methods Explanation;Demonstration;Handout   Comprehension Verbalized understanding;Returned demonstration          PT Short Term Goals - 02/01/16 1239      PT SHORT TERM GOAL #1   Title =LTG's due to new medicaid approval for 8 visits through 03/10/16           PT Long Term Goals - 02/01/16 1240      PT LONG TERM GOAL #1   Title Pt will be independent with HEP in order to indicate improved functional mobility and decreased fall risk.  (Target date: 03/10/16-just got approval from Medicaid during visit on 02/01/16)   Baseline has 2 exercises, max verbal cues    Time 4   Period Weeks   Status New     PT LONG TERM GOAL #2    Title Pt will improve BERG balance test by 8 points from baseline in order to indicate decreased fall risk.    Baseline 46/56 on 02/01/16   Time 4   Period Weeks   Status On-going     PT LONG TERM GOAL #3   Title Pt will improve gait speed to >2.62 ft/sec w/ LRAD in order to indicate pt is community ambulator.     Baseline 1.68 ft/sec on 12/07/15   Time 4   Period Weeks   Status On-going     PT LONG TERM GOAL #4   Title Pt will ambulate over varying outdoor surfaces (including grass, ramp, curb) x 500' w/ LRAD at mod I level in order to indicate safe return to community and leisure activities.     Baseline S over indoor surfaces with LBQC and R AFO   Time 4   Period Weeks   Status On-going     PT LONG TERM GOAL #5   Title Pt will perform floor transfer at mod I level in order to indicate safe fall recovery.     Baseline unable to assess due to time constraint   Time 4   Period Weeks   Status On-going     Additional Long Term Goals   Additional Long Term Goals Yes     PT LONG TERM GOAL #6   Title Pt will ambulate without AD, with brace up to 500' over indoor surfaces at mod I level in order to indicate improved independence with gait at home.    Baseline S to min/guard without brace on 02/01/16   Time 4   Period Weeks   Status New               Plan - 02/01/16 1610    Clinical Impression Statement Skilled session focused on initiation of HEP for R LE strengthening and NMR, see pt instruction for details, performing BERG with score of 46/56.  Also discussed getting scheduled for more visits, as he was approved 8 visits for PT.     Rehab Potential Good   Clinical Impairments Affecting Rehab Potential Medicaid visit limitations   PT Frequency 2x / week   PT Duration 6 weeks   PT Treatment/Interventions ADLs/Self Care Home Management;Electrical Stimulation;DME Instruction;Gait training;Stair training;Functional mobility training;Therapeutic activities;Therapeutic  exercise;Balance training;Neuromuscular re-education;Patient/family education;Orthotic Fit/Training;Vestibular;Energy conservation   PT Next Visit Plan RLE NMR, try gait with SPC, quad control on RLE, increased R glute med activation during gait .   Consulted and Agree with Plan of Care Patient;Family member/caregiver   Family Member Consulted Daughter April  Patient will benefit from skilled therapeutic intervention in order to improve the following deficits and impairments:  Abnormal gait, Decreased balance, Decreased coordination, Decreased knowledge of precautions, Decreased knowledge of use of DME, Decreased mobility, Decreased safety awareness, Decreased strength, Impaired perceived functional ability, Improper body mechanics, Postural dysfunction  Visit Diagnosis: Hemiplegia and hemiparesis following cerebral infarction affecting right dominant side (HCC) - Plan: PT plan of care cert/re-cert  Unsteadiness on feet - Plan: PT plan of care cert/re-cert  Other abnormalities of gait and mobility - Plan: PT plan of care cert/re-cert     Problem List Patient Active Problem List   Diagnosis Date Noted  . Right hemiparesis (HCC)   . Myofascial muscle pain   . 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 Prediabetes   . Stroke (HCC) 10/25/2015  . CVA (cerebral infarction) 10/25/2015  . Stroke (cerebrum) (HCC) 10/25/2015  . Essential hypertension 10/25/2015  . Hyperlipidemia 10/25/2015  . Acute ischemic stroke (HCC)   . Cerebral infarction due to unspecified mechanism   . Dysarthria     Harriet ButteEmily Demarea Lorey, PT, MPT [facility] [address] [place], [place], [address] Phone: [phone]   Fax:  [phone] 02/01/16, 12:47 PM  Name: Brad Tran MRN: [id number] Date of Birth: 01/12/1966

## 2016-02-01 NOTE — Patient Instructions (Addendum)
Weight Bearing Hand Sit    Place hands flat at sides. Lean body weight to right side (can reach with the left hand keeping elbow straight and shoulder in alignment). Hold 10 seconds.  KEEP CHEST UP Repeat 10 times. Do 1-2 sessions per day.   Scapular: Stabilization    With palms resting comfortably on table, gently lean forward over hand. Hold 10 seconds. Relax. Repeat 10 times per set. . Do 1-2 sessions per day.       Lay on your back, clasp hands together with thumb facing up.  Keep elbows straight.  Slowly raise arms up as high as you can without pain.  ONLY STRETCH.  Hold 10sec.  Repeat 10x, 2 times/day.            SELF ASSISTED WITH OBJECT: Shoulder Flexion / Elbow Extension (Crutch)    Place one hand on cane (tilted). Move arm forward, straighten elbow. 15 reps per set, 1-2 sets per day.  Copyright  VHI. All rights reserved.

## 2016-02-01 NOTE — Patient Instructions (Signed)
Functional Quadriceps: Sit to Stand    Sit on edge of chair, feet flat on floor. Stand upright, extending knees fully. Repeat _15___ times per set. Do __1 sets per session. Do _2___ sessions per day.  http://orth.exer.us/734   Copyright  VHI. All rights reserved.   SINGLE LIMB STANCE    Stand at counter top for support (try to only use one hand) Stance: right leg on floor. Raise left leg. Hold _20__ seconds. Repeat with other leg. _3__ reps per set, __2_ sets per day, _5-7__ days per week  Copyright  VHI. All rights reserved.   "I love a Licensed conveyancerarade" Lift    Stand next to counter top for support, march in place as high as you can and slowly!!.  The right leg may get tired before you are done, just raise as high as you can without using trunk to lift leg.   Repeat __10__ times. Do __2__ sessions per day.  http://gt2.exer.us/344   Copyright  VHI. All rights reserved.   Bracing With Leg Extension in Bridging (Hook-Lying)    With neutral spine, tighten pelvic floor and abdominals and hold. Hold left leg up or place over top of couch and bend R leg. Push through right leg to lift hips, make sure that right leg remains in line with body. Repeat _10__ times. Do _2__ times a day.   Copyright  VHI. All rights reserved.   Adductor exercise - Supine    Lie on floor or bed, knees bent, feet flat. Keeping feet together, lower knees toward floor until stretch felt at inner thighs.  Then slowly bring them back together, focusing on equal movement and improved control in R leg.  Repeat _10__ times. Do _2__ times per day.  Copyright  VHI. All rights reserved.

## 2016-02-02 NOTE — Therapy (Signed)
[facility] [address] [place], [place], [address] Phone: [phone]   Fax:  [phone]  Occupational Therapy Treatment  Patient Details  Name: Brad Tran MRN: [id number] Date of Birth: 07-09-1965 Referring Provider: Dr. Alysia Penna  Encounter Date: 02/01/2016      OT End of Session - 02/01/16 1350    Visit Number 2   Number of Visits 17   Date for OT Re-Evaluation 02/06/16   Authorization Type Medicaid approved 16 OT visits 01/29/16-03/24/16   Authorization - Visit Number 1   Authorization - Number of Visits 16   OT Start Time 1018   OT Stop Time 1100   OT Time Calculation (min) 42 min   Activity Tolerance Patient tolerated treatment well   Behavior During Therapy [facility] for tasks assessed/performed      Past Medical History:  Diagnosis Date  . Hyperlipidemia   . Hypertension   . Stroke [facility])     No past surgical history on file.  There were no vitals filed for this visit.      Subjective Assessment - 02/01/16 1349    Subjective  Pt reports that he has been having some pain (since eval)   Pertinent History small chronic infarct L thalamus, acute L basal ganglia nonhemorrhagic infarct   Limitations fall risk   Patient Stated Goals be able to use RUE   Currently in Pain? Yes   Pain Score --  0-5/10   Pain Location Arm   Pain Orientation Right   Pain Descriptors / Indicators Aching;Sharp   Pain Onset More than a month ago   Pain Frequency Intermittent   Aggravating Factors  movement prior to stretching   Pain Relieving Factors stretching, proper positioning                               OT Education - 02/02/16 0810    Education Details Added ex to HEP--see pt instructions; instructed pt to also practice lightly grasping object and then trying to relax prior to releasing    Person(s) Educated Patient;Child(ren)   Methods Explanation;Demonstration;Verbal cues;Handout   Comprehension Verbalized understanding;Returned demonstration;Verbal cues required  min cues          OT Short Term Goals - 02/01/16 1356      OT SHORT TERM GOAL #1   Title Pt will be independent with initial HEP.--check STGs 01/07/16   Baseline reports only 1-2 exercises for RUE from home health   Time 4   Period Weeks   Status New     OT SHORT TERM GOAL #2   Title Pt will verbalize understanding of edema management techniques and proper positioning of RUE to prevent pain/injury.   Baseline dependent, min-mod edema in R hand   Time 4   Period Weeks   Status New     OT SHORT TERM GOAL #3   Title Pt will be independent with splint wear/care prn for improved positioning.   Baseline no splint   Time 4   Period Weeks   Status New     OT SHORT TERM GOAL #4   Title Pt will demo at least 20* R shoulder flex in prep for functional reach.   Baseline no true active shoulder flex, approx 30* AAROM   Time 4   Period Weeks   Status New     OT SHORT TERM GOAL #5   Title Pt will be demo at  least 25% gross finger flex in prep for functional grasp.   Baseline trace   Time 4   Period Weeks   Status Achieved  02/01/16     OT SHORT TERM GOAL #6   Title Pt will use RUE as stabilizer for selected tasks.   Baseline unable to use RUE functionally   Time 4   Period Weeks   Status New           OT Long Term Goals - 12/07/15 1713      OT LONG TERM GOAL #1   Title Pt will be independent with updated HEP--check LTGs 02/06/16   Baseline dependent   Time 8   Period Weeks   Status New     OT LONG TERM GOAL #2   Title Pt will perform simple cooking task mod I.   Baseline only warming food in microwave currently (family prepares)   Time 8   Period Weeks   Status New     OT LONG TERM GOAL #3   Title Pt will demo at least 30* R shoulder flex in prep for functional reach.   Baseline no true active shoulder flex, approx 30* AAROM   Time 8   Period Weeks   Status New     OT  LONG TERM GOAL #4   Title Pt will use RUE as gross assist for selected tasks.   Baseline unable to use RUE functionally.   Time 8   Period Weeks   Status New     OT LONG TERM GOAL #5   Title Pt will be able to perform light weightbearing through RUE without pain to assist in transfers/IADLs.   Baseline unable to use RUE functionally.   Time 8   Period Weeks   Status New               Plan - 02/01/16 1356    Clinical Impression Statement Pt has not been seen since evaluation due to awaiting Medicaid authorization.  Current POC/goals remain appropriate.  However, pt has met 1 STG with improved active finger flex and ability to initiate finger extension now (inconsistently due to tone).  Pt responded well to session with improved pain and PROM at end of session as well as demonstrating AAROM outside of flexor synergy pattern.   Rehab Potential Good   OT Frequency 2x / week   OT Duration 8 weeks  + evaluation recommended; however, may need to modify due to pt's financial concerns and possible medicaid visit limitations (medicaid pending)   OT Treatment/Interventions Self-care/ADL training;Fluidtherapy;DME and/or AE instruction;Moist Heat;Splinting;Patient/family education;Therapeutic exercises;Contrast Bath;Ultrasound;Therapeutic exercise;Therapeutic activities;Passive range of motion;Functional Mobility Training;Neuromuscular education;Cryotherapy;Electrical Stimulation;Parrafin;Manual Therapy;Energy conservation   Plan review HEPs and continue with neuro re-ed   OT Home Exercise Plan Education provided:  12/07/15 edema managment, initial HEP; 02/01/16  added to HEP   Consulted and Agree with Plan of Care Patient;Family member/caregiver   Family Member Consulted daughter      Patient will benefit from skilled therapeutic intervention in order to improve the following deficits and impairments:  Decreased mobility, Decreased strength, Impaired UE functional use, Decreased knowledge of  use of DME, Impaired tone, Increased edema, Decreased coordination, Decreased range of motion, Decreased balance  Visit Diagnosis: Hemiplegia and hemiparesis following cerebral infarction affecting right dominant side (HCC)  Other lack of coordination  Other abnormalities of gait and mobility    Problem List Patient Active Problem List   Diagnosis Date Noted  . Right hemiparesis ([place])   .  Myofascial muscle pain   . Cerebral infarction due to thrombosis of left middle cerebral artery ([place])   . Prediabetes   . Stroke ([place]) 10/25/2015  . CVA (cerebral infarction) 10/25/2015  . Stroke (cerebrum) ([place]) 10/25/2015  . Essential hypertension 10/25/2015  . Hyperlipidemia 10/25/2015  . Acute ischemic stroke ([place])   . Cerebral infarction due to unspecified mechanism   . Dysarthria     [facility] 02/02/2016, 8:15 AM  [facility] [address] [place], [place], [address] Phone: [phone]   Fax:  [phone]  Name: Kelcey Wickstrom MRN: [id number] Date of Birth: 1966-02-05   Vianne Bulls, OTR/L [facility] [address] [place], [place]  [address] [phone] phone [phone] 02/02/16 8:16 AM

## 2016-02-09 ENCOUNTER — Ambulatory Visit: Payer: Medicaid Other | Admitting: Occupational Therapy

## 2016-02-09 ENCOUNTER — Ambulatory Visit: Payer: Medicaid Other | Admitting: Physical Therapy

## 2016-02-10 ENCOUNTER — Telehealth: Payer: Self-pay

## 2016-02-10 MED ORDER — ATORVASTATIN CALCIUM 80 MG PO TABS: 80.0000 mg | ORAL_TABLET | Freq: Every day | ORAL | 3 refills | Status: DC

## 2016-02-10 MED ORDER — METOPROLOL TARTRATE 50 MG PO TABS: 50.0000 mg | ORAL_TABLET | Freq: Two times a day (BID) | ORAL | 3 refills | Status: DC

## 2016-02-10 MED FILL — METOPROLOL TARTRATE 50 MG T: 50 | 34 days supply | Qty: 68 | Fill #0

## 2016-02-10 MED FILL — ATORVASTATIN 80 MG TABLET: 80 | 34 days supply | Qty: 34 | Fill #0

## 2016-02-10 NOTE — Telephone Encounter (Signed)
This has been refilled and sent into pharmacy. Thanks!  

## 2016-02-11 ENCOUNTER — Ambulatory Visit: Payer: Medicaid Other | Admitting: Rehabilitation

## 2016-02-11 ENCOUNTER — Ambulatory Visit: Payer: Medicaid Other | Admitting: Occupational Therapy

## 2016-02-11 ENCOUNTER — Encounter: Payer: Self-pay | Admitting: Rehabilitation

## 2016-02-11 DIAGNOSIS — R278 Other lack of coordination: Secondary | ICD-10-CM

## 2016-02-11 DIAGNOSIS — I69351 Hemiplegia and hemiparesis following cerebral infarction affecting right dominant side: Secondary | ICD-10-CM | POA: Diagnosis not present

## 2016-02-11 DIAGNOSIS — R2689 Other abnormalities of gait and mobility: Secondary | ICD-10-CM

## 2016-02-11 DIAGNOSIS — R2681 Unsteadiness on feet: Secondary | ICD-10-CM

## 2016-02-11 NOTE — Patient Instructions (Signed)
   1.  Lay on your back, arch your back up and bring your shoulder blades together.  Hold 5 sec, count out loud.  Repeat 10x, 1-2x/day.  2.  Lay on your back, with arms by your side.  Reach down toward your heels trying to push shoulders away from your ears.  Repeat 10x, 1-2x/day.  3.  Lay on your back.  Bend right elbow.  Use left hand to hold/support right arm below elbow.  Straighten right elbow slowly, hold 5sec then slowly relax.  Make sure you do not let your shoulder go up to your ear.  Easy, controlled movement.    4.  Make sure you do not lift anything with right arm.  When you try to raise right arm make sure your elbow is straight, thumb is up, shoulder is down, and elbow is in next to your body.    Weight Bearing Hand Sit    Place hands flat at sides (fingers pointing out to the side). Lean body weight to right side (can reach with the left hand keeping elbow straight and shoulder in alignment--don't let it turn in). Hold 10 seconds.  KEEP CHEST UP Repeat 10 times. Do 1-2 sessions per day.

## 2016-02-11 NOTE — Therapy (Signed)
[facility] [address] [place], [place], [address] Phone: [phone]   Fax:  [phone]  Physical Therapy Treatment  Patient Details  Name: Brad Tran MRN: [id number] Date of Birth: October 04, 1965 Referring Provider: [name], MD  Encounter Date: 02/11/2016      PT End of Session - 02/11/16 1554    Visit Number 3   Number of Visits 9  updated due to medicaid approval on 02/01/16   Date for PT Re-Evaluation 03/10/16  updated due to medicaid approval 02/01/16   Authorization Type Medicaid approved 8 visits from 01/29/16-03/10/16   Authorization - Visit Number 2   Authorization - Number of Visits 8   PT Start Time 0933   PT Stop Time 1015   PT Time Calculation (min) 42 min   Activity Tolerance Patient tolerated treatment well   Behavior During Therapy [facility] for tasks assessed/performed      Past Medical History:  Diagnosis Date  . Hyperlipidemia   . Hypertension   . Stroke [facility])     History reviewed. No pertinent surgical history.  There were no vitals filed for this visit.      Subjective Assessment - 02/11/16 0934    Subjective "I want to go to the gym"    Patient is accompained by: Family member   Pertinent History Pronouned "Choo-un" , daughter April    Limitations Walking;House hold activities   Currently in Pain? No/denies   Pain Score 0-No pain             Gait:  Note that pts daughter states that he is walking at home without brace and shoes but that she purchase an air cast in order to protect ankle during gait in this manner.  PT assessed gait for short distance without brace x 10' and QC (quad cane) however continue to note ankle weakness, esp ability to activate eversion and therefore ankle in danger of rolling/sustaining injury.  Then assessed gait x 10' with QC with air cast donned.  Note forceful genu recurvatum at knee, therefore educated pt and daughter that this is not  recommended for safety of knee.  Further assessed with air cast and heel wedge x 10' w/ QC with no noted improvement, therefore continued to discourage gait in this manner at home.  Upon returning pts brace into shoe and performing short gait, continued to note mild recurvatum, but not as forceful as with aircast, therefore PT placed small heel wedge in shoe with noted improvement, therefore will maintain this in shoe.  Performed gait x 115' in this manner with QC at S to mod I level.  Then provided pt with SPC x 230' at S level.  Min cues for decreased step length on RLE and increased step length on LLE.  Educated that they would likely have to purchase cane on their own due to Medicare already covering QC in hospital.  Pt and daughter verbalized understanding.    NMR:  Worked on sit<> stand with use of mirror for improved visual feedback with focus on R quad control.  Cues for slower movement to increase attention to R knee.  Transitioned to sit<>stand with 3" block under LLE to further increase weight shift to the R as he still tends to stand biased to the L.  Cues for slower controlled movement, but demonstrates good knee control.  Ended with TKE exercise with red theraband x 10 reps with cues for extending just into band then stopping as to prevent  locking out.  Provided this for HEP, see pt instruction.    Self Care:  Discussed pt returning to gym per OT notifying PT on this matter.  Education to avoid sauna due to possible elevation in BP and swelling.  Also state that PT would like to see him on treadmill in this clinic first to assess what R knee will do when fatigued.  Pt verbalized understanding.                     PT Education - 02/11/16 0936    Education provided Yes   Education Details Educated to hold on gym activities until therapist could observe him on treadmill at next session for safety as well as safety of L knee to ensure decreased recurvatum, esp with fatigue.     Person(s) Educated Patient;Child(ren)   Methods Explanation   Comprehension Verbalized understanding          PT Short Term Goals - 02/01/16 1239      PT SHORT TERM GOAL #1   Title =LTG's due to new medicaid approval for 8 visits through 03/10/16           PT Long Term Goals - 02/01/16 1240      PT LONG TERM GOAL #1   Title Pt will be independent with HEP in order to indicate improved functional mobility and decreased fall risk.  (Target date: 03/10/16-just got approval from Medicaid during visit on 02/01/16)   Baseline has 2 exercises, max verbal cues    Time 4   Period Weeks   Status New     PT LONG TERM GOAL #2   Title Pt will improve BERG balance test by 8 points from baseline in order to indicate decreased fall risk.    Baseline 46/56 on 02/01/16   Time 4   Period Weeks   Status On-going     PT LONG TERM GOAL #3   Title Pt will improve gait speed to >2.62 ft/sec w/ LRAD in order to indicate pt is community ambulator.     Baseline 1.68 ft/sec on 12/07/15   Time 4   Period Weeks   Status On-going     PT LONG TERM GOAL #4   Title Pt will ambulate over varying outdoor surfaces (including grass, ramp, curb) x 500' w/ LRAD at mod I level in order to indicate safe return to community and leisure activities.     Baseline S over indoor surfaces with LBQC and R AFO   Time 4   Period Weeks   Status On-going     PT LONG TERM GOAL #5   Title Pt will perform floor transfer at mod I level in order to indicate safe fall recovery.     Baseline unable to assess due to time constraint   Time 4   Period Weeks   Status On-going     Additional Long Term Goals   Additional Long Term Goals Yes     PT LONG TERM GOAL #6   Title Pt will ambulate without AD, with brace up to 500' over indoor surfaces at mod I level in order to indicate improved independence with gait at home.    Baseline S to min/guard without brace on 02/01/16   Time 4   Period Weeks   Status New                Plan - 02/11/16 1554    Clinical Impression Statement Skilled session focused on  assessment of air cast during gait as daughter says he uses something like this at home without shoes to walk around home with.  Discussed that he needs to wear brace with shoes when walking not only for R foot clearance, but also to help with R knee control.  Pt and daughter verbalized understanding.   Also worked on gait with cane and added to HEP for R quad control.  See pt instruction.     Rehab Potential Good   Clinical Impairments Affecting Rehab Potential Medicaid visit limitations   PT Frequency 2x / week   PT Duration 6 weeks   PT Treatment/Interventions ADLs/Self Care Home Management;Electrical Stimulation;DME Instruction;Gait training;Stair training;Functional mobility training;Therapeutic activities;Therapeutic exercise;Balance training;Neuromuscular re-education;Patient/family education;Orthotic Fit/Training;Vestibular;Energy conservation   PT Next Visit Plan Look at gait on treadmill-is he safe to do at gym (esp due to recuvatum and with fatigue) RLE NMR, try gait with SPC and without AD for challenge, quad control on RLE, increased R glute med activation during gait .   Consulted and Agree with Plan of Care Patient;Family member/caregiver   Family Member Consulted Daughter April       Patient will benefit from skilled therapeutic intervention in order to improve the following deficits and impairments:  Abnormal gait, Decreased balance, Decreased coordination, Decreased knowledge of precautions, Decreased knowledge of use of DME, Decreased mobility, Decreased safety awareness, Decreased strength, Impaired perceived functional ability, Improper body mechanics, Postural dysfunction  Visit Diagnosis: Hemiplegia and hemiparesis following cerebral infarction affecting right dominant side (HCC)  Unsteadiness on feet  Other abnormalities of gait and mobility     Problem List Patient  Active Problem List   Diagnosis Date Noted  . Right hemiparesis (HCC)   . Myofascial muscle pain   . 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 Prediabetes   . Stroke (HCC) 10/25/2015  . CVA (cerebral infarction) 10/25/2015  . Stroke (cerebrum) (HCC) 10/25/2015  . Essential hypertension 10/25/2015  . Hyperlipidemia 10/25/2015  . Acute ischemic stroke (HCC)   . Cerebral infarction due to unspecified mechanism   . Dysarthria     Harriet Butte, PT, MPT [facility] [address] [place], [place], [address] Phone: [phone]   Fax:  [phone] 02/11/16, 4:09 PM  Name: Khaliel Morey MRN: [id number] Date of Birth: [birth year]

## 2016-02-11 NOTE — Therapy (Signed)
[facility] [address] [place], [place], [address] Phone: [phone]   Fax:  [phone]  Occupational Therapy Treatment  Patient Details  Name: Brad Tran MRN: [id number] Date of Birth: 1965-05-12 Referring Provider: Dr. [name]  Encounter Date: 02/11/2016      OT End of Session - 02/11/16 0906    Visit Number 3   Number of Visits 17   Date for OT Re-Evaluation 02/06/16   Authorization Type Medicaid approved 16 OT visits 01/29/16-03/24/16   Authorization - Visit Number 2   Authorization - Number of Visits 16   OT Start Time 0859  pt arrived late   OT Stop Time 0930   OT Time Calculation (min) 31 min   Activity Tolerance Patient tolerated treatment well   Behavior During Therapy [facility] for tasks assessed/performed      Past Medical History:  Diagnosis Date  . Hyperlipidemia   . Hypertension   . Stroke [facility])     No past surgical history on file.  There were no vitals filed for this visit.      Subjective Assessment - 02/11/16 0902    Subjective  Pt reports that exercises are going well at home    Pertinent History small chronic infarct L thalamus, acute L basal ganglia nonhemorrhagic infarct   Limitations fall risk   Patient Stated Goals be able to use RUE   Currently in Pain? No/denies         In supine, self PROM/AAROM shoulder flex, min cueing for positioning/scapular depression.  In supine, tricep ext with RUE supported with min-mod cues for scapular depression.  Pt/dtr. Ask questions about using sauna.  Recommended against due to edema in R hand and possible affect on BP.  Also asked about gym activities.  Instructed pt to only perform OT HEP for RUE and do not lift anything with RUE as this can contribute to pain due to poor positioning and compensation patterns (pt reports pain after lifting gallon of water).  Pt/dtr. Verbalized understanding.  Pt instructed importance of proper  positioning and avoiding compensation to prevent pain and future complications.  Wt. Bearing through R hand on mat in abduction with lateral wt. Shift and body on arm movements with min-mod cueing initially for positioning and to avoid compensation patterns.                        OT Education - 02/11/16 1634    Education Details Added to HEP--see pt instructions   Person(s) Educated Patient   Methods Explanation;Demonstration;Handout   Comprehension Verbalized understanding;Returned demonstration;Verbal cues required;Need further instruction          OT Short Term Goals - 02/01/16 1356      OT SHORT TERM GOAL #1   Title Pt will be independent with initial HEP.--check STGs 01/07/16   Baseline reports only 1-2 exercises for RUE from home health   Time 4   Period Weeks   Status New     OT SHORT TERM GOAL #2   Title Pt will verbalize understanding of edema management techniques and proper positioning of RUE to prevent pain/injury.   Baseline dependent, min-mod edema in R hand   Time 4   Period Weeks   Status New     OT SHORT TERM GOAL #3   Title Pt will be independent with splint wear/care prn for improved positioning.   Baseline no splint   Time 4   Period Weeks  Status New     OT SHORT TERM GOAL #4   Title Pt will demo at least 20* R shoulder flex in prep for functional reach.   Baseline no true active shoulder flex, approx 30* AAROM   Time 4   Period Weeks   Status New     OT SHORT TERM GOAL #5   Title Pt will be demo at least 25% gross finger flex in prep for functional grasp.   Baseline trace   Time 4   Period Weeks   Status Achieved  02/01/16     OT SHORT TERM GOAL #6   Title Pt will use RUE as stabilizer for selected tasks.   Baseline unable to use RUE functionally   Time 4   Period Weeks   Status New           OT Long Term Goals - 12/07/15 1713      OT LONG TERM GOAL #1   Title Pt will be independent with updated HEP--check  LTGs 02/06/16   Baseline dependent   Time 8   Period Weeks   Status New     OT LONG TERM GOAL #2   Title Pt will perform simple cooking task mod I.   Baseline only warming food in microwave currently (family prepares)   Time 8   Period Weeks   Status New     OT LONG TERM GOAL #3   Title Pt will demo at least 30* R shoulder flex in prep for functional reach.   Baseline no true active shoulder flex, approx 30* AAROM   Time 8   Period Weeks   Status New     OT LONG TERM GOAL #4   Title Pt will use RUE as gross assist for selected tasks.   Baseline unable to use RUE functionally.   Time 8   Period Weeks   Status New     OT LONG TERM GOAL #5   Title Pt will be able to perform light weightbearing through RUE without pain to assist in transfers/IADLs.   Baseline unable to use RUE functionally.   Time 8   Period Weeks   Status New               Plan - 02/11/16 0910    Clinical Impression Statement Pt is progressing with incr tricep activation and incr RUE control.     Rehab Potential Good   OT Frequency 2x / week   OT Duration 8 weeks  +eval   OT Treatment/Interventions Self-care/ADL training;Fluidtherapy;DME and/or AE instruction;Moist Heat;Splinting;Patient/family education;Therapeutic exercises;Contrast Bath;Ultrasound;Therapeutic exercise;Therapeutic activities;Passive range of motion;Functional Mobility Training;Neuromuscular education;Cryotherapy;Electrical Stimulation;Parrafin;Manual Therapy;Energy conservation   Plan continue with neuro re-ed; emphasize proper positioning/shoulder alignment without compensation   OT Home Exercise Plan Education provided:  12/07/15 edema managment, initial HEP; 02/01/16  added to HEP   Consulted and Agree with Plan of Care Patient;Family member/caregiver   Family Member Consulted daughter      Patient will benefit from skilled therapeutic intervention in order to improve the following deficits and impairments:  Decreased  mobility, Decreased strength, Impaired UE functional use, Decreased knowledge of use of DME, Impaired tone, Increased edema, Decreased coordination, Decreased range of motion, Decreased balance  Visit Diagnosis: Hemiplegia and hemiparesis following cerebral infarction affecting right dominant side (HCC)  Unsteadiness on feet  Other abnormalities of gait and mobility  Other lack of coordination    Problem List Patient Active Problem List   Diagnosis Date Noted  .  Right hemiparesis (HCC)   . Myofascial muscle pain   . 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 Prediabetes   . Stroke (HCC) 10/25/2015  . CVA (cerebral infarction) 10/25/2015  . Stroke (cerebrum) (HCC) 10/25/2015  . Essential hypertension 10/25/2015  . Hyperlipidemia 10/25/2015  . Acute ischemic stroke (HCC)   . Cerebral infarction due to unspecified mechanism   . Dysarthria     [facility] PonceFREEMAN,Shearon Clonch 02/11/2016, 4:35 PM  [facility] [address] [place], [place], [address] Phone: [phone]   Fax:  [phone]  Name: Brad Tran MRN: [id number] Date of Birth: 11/01/1965   Willa FraterAngela Deedra Pro, OTR/L [facility] [address] [place], [place]  [address] [phone] 02/11/16 4:35 PM

## 2016-02-11 NOTE — Patient Instructions (Signed)
   TERMINAL KNEE EXTENSION - TKE  Start in a standing position with an elastic band attached to your slightly bent knee.  (Your toes should be touching the floor)  Next, draw your knee back towards a straightened position so that your heel touches the floor.  Make sure that you just straighten your knee into the band, do not lock it out.  Repeat x 10 reps.  Do 2-3 times per day.

## 2016-02-16 ENCOUNTER — Ambulatory Visit: Payer: Medicaid Other | Admitting: Physical Therapy

## 2016-02-16 ENCOUNTER — Encounter: Payer: Self-pay | Admitting: Physical Therapy

## 2016-02-16 ENCOUNTER — Ambulatory Visit: Payer: Medicaid Other | Admitting: Occupational Therapy

## 2016-02-16 DIAGNOSIS — R2689 Other abnormalities of gait and mobility: Secondary | ICD-10-CM

## 2016-02-16 DIAGNOSIS — R2681 Unsteadiness on feet: Secondary | ICD-10-CM

## 2016-02-16 DIAGNOSIS — I69351 Hemiplegia and hemiparesis following cerebral infarction affecting right dominant side: Secondary | ICD-10-CM

## 2016-02-16 NOTE — Therapy (Signed)
[facility] [address] [place], [place], [address] Phone: [phone]   Fax:  [phone]  Occupational Therapy Treatment  Patient Details  Name: Brad BrooksChhoun Talton MRN: [id number] Date of Birth: 04/20/1965 Referring Provider: Dr. Claudette LawsAndrew Kirsteins  Encounter Date: 02/16/2016      OT End of Session - 02/16/16 1142    Visit Number 4   Number of Visits 17   Date for OT Re-Evaluation 02/06/16   Authorization Type Medicaid approved 16 OT visits 01/29/16-03/24/16   Authorization - Visit Number 3   Authorization - Number of Visits 16   OT Start Time 1100   OT Stop Time 1145   OT Time Calculation (min) 45 min   Activity Tolerance Patient tolerated treatment well      Past Medical History:  Diagnosis Date  . Hyperlipidemia   . Hypertension   . Stroke [facility](HCC)     No past surgical history on file.  There were no vitals filed for this visit.      Subjective Assessment - 02/16/16 1102    Pertinent History small chronic infarct L thalamus, acute L basal ganglia nonhemorrhagic infarct   Limitations fall risk   Patient Stated Goals be able to use RUE   Currently in Pain? No/denies                      OT Treatments/Exercises (OP) - 02/16/16 0001      Neurological Re-education Exercises   Other Exercises 1 Supine: begain with self P/ROM ex's in shoulder flexion. Progressed to AA/ROM BUE's using PVC frame with min assist/facilitation to prevent sh. abd and IR RUE.   Other Exercises 2 Seated: AA/ROM BUE's in low range rolling physioball out on floor for sh. flexion and elbow extension. Progressed to same activity with RUE only. Then began AA/ROM in midrange using UE Ranger for sh. flex/elbow ext followed by horizontal abd. with scapula retraction     Modalities   Modalities Electrical Stimulation     Electrical Stimulation   Electrical Stimulation Location dorsal forearm   Electrical Stimulation Action  wrist/finger extension   Electrical Stimulation Parameters 50 pps, 250 pw, 10 sec. on/off cycle x 10 min   Electrical Stimulation Goals Neuromuscular facilitation                  OT Short Term Goals - 02/01/16 1356      OT SHORT TERM GOAL #1   Title Pt will be independent with initial HEP.--check STGs 01/07/16   Baseline reports only 1-2 exercises for RUE from home health   Time 4   Period Weeks   Status New     OT SHORT TERM GOAL #2   Title Pt will verbalize understanding of edema management techniques and proper positioning of RUE to prevent pain/injury.   Baseline dependent, min-mod edema in R hand   Time 4   Period Weeks   Status New     OT SHORT TERM GOAL #3   Title Pt will be independent with splint wear/care prn for improved positioning.   Baseline no splint   Time 4   Period Weeks   Status New     OT SHORT TERM GOAL #4   Title Pt will demo at least 20* R shoulder flex in prep for functional reach.   Baseline no true active shoulder flex, approx 30* AAROM   Time 4   Period Weeks   Status New  OT SHORT TERM GOAL #5   Title Pt will be demo at least 25% gross finger flex in prep for functional grasp.   Baseline trace   Time 4   Period Weeks   Status Achieved  02/01/16     OT SHORT TERM GOAL #6   Title Pt will use RUE as stabilizer for selected tasks.   Baseline unable to use RUE functionally   Time 4   Period Weeks   Status New           OT Long Term Goals - 12/07/15 1713      OT LONG TERM GOAL #1   Title Pt will be independent with updated HEP--check LTGs 02/06/16   Baseline dependent   Time 8   Period Weeks   Status New     OT LONG TERM GOAL #2   Title Pt will perform simple cooking task mod I.   Baseline only warming food in microwave currently (family prepares)   Time 8   Period Weeks   Status New     OT LONG TERM GOAL #3   Title Pt will demo at least 30* R shoulder flex in prep for functional reach.   Baseline no true  active shoulder flex, approx 30* AAROM   Time 8   Period Weeks   Status New     OT LONG TERM GOAL #4   Title Pt will use RUE as gross assist for selected tasks.   Baseline unable to use RUE functionally.   Time 8   Period Weeks   Status New     OT LONG TERM GOAL #5   Title Pt will be able to perform light weightbearing through RUE without pain to assist in transfers/IADLs.   Baseline unable to use RUE functionally.   Time 8   Period Weeks   Status New               Plan - 02/16/16 1143    Clinical Impression Statement Pt with RUE pain only with improper positioning, but reduced or eliminated after proper stretching   Rehab Potential Good   OT Frequency 2x / week   OT Duration 8 weeks   OT Treatment/Interventions Self-care/ADL training;Fluidtherapy;DME and/or AE instruction;Moist Heat;Splinting;Patient/family education;Therapeutic exercises;Contrast Bath;Ultrasound;Therapeutic exercise;Therapeutic activities;Passive range of motion;Functional Mobility Training;Neuromuscular education;Cryotherapy;Electrical Stimulation;Parrafin;Manual Therapy;Energy conservation   Plan continue NMR RUE (try wt bearing), estim   OT Home Exercise Plan Education provided:  12/07/15 edema managment, initial HEP; 02/01/16  added to HEP      Patient will benefit from skilled therapeutic intervention in order to improve the following deficits and impairments:  Decreased mobility, Decreased strength, Impaired UE functional use, Decreased knowledge of use of DME, Impaired tone, Increased edema, Decreased coordination, Decreased range of motion, Decreased balance  Visit Diagnosis: Hemiplegia and hemiparesis following cerebral infarction affecting right dominant side [facility](HCC)    Problem List Patient Active Problem List   Diagnosis Date Noted  . Right hemiparesis (HCC)   . Myofascial muscle pain   . 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 Prediabetes   .  Stroke (HCC) 10/25/2015  . CVA (cerebral infarction) 10/25/2015  . Stroke (cerebrum) (HCC) 10/25/2015  . Essential hypertension 10/25/2015  . Hyperlipidemia 10/25/2015  . Acute ischemic stroke (HCC)   . Cerebral infarction due to unspecified mechanism   . Dysarthria     Kelli ChurnBallie, Broly Hatfield Johnson, OTR/L 02/16/2016, 11:47 AM  [facility]  [address] [place], [place], [address] Phone: [phone]   Fax:  [phone]  Name: Mathews Stuhr MRN: [id number] Date of Birth: 31-Mar-1966

## 2016-02-17 ENCOUNTER — Ambulatory Visit (INDEPENDENT_AMBULATORY_CARE_PROVIDER_SITE_OTHER): Payer: Medicaid Other

## 2016-02-17 ENCOUNTER — Telehealth: Payer: Self-pay

## 2016-02-17 DIAGNOSIS — Z0289 Encounter for other administrative examinations: Secondary | ICD-10-CM

## 2016-02-17 DIAGNOSIS — I63312 Cerebral infarction due to thrombosis of left middle cerebral artery: Secondary | ICD-10-CM

## 2016-02-17 NOTE — Telephone Encounter (Signed)
Pt in office for bubble study. Placed 22G IV in Left AC x1 attempt. Cleaned with chlorhexidine prior to insertion. +blood return and flushes without resistance. Secured with tegaderm and tape. Pt tolerated well. No complaint of pain upon flushing IV. No visible signs of swelling or bruising. Site clean/dry/intact. Placed IV at 1350.

## 2016-02-18 ENCOUNTER — Ambulatory Visit: Payer: Medicaid Other | Attending: Physical Medicine & Rehabilitation | Admitting: Rehabilitation

## 2016-02-18 ENCOUNTER — Encounter: Payer: Self-pay | Admitting: Rehabilitation

## 2016-02-18 ENCOUNTER — Ambulatory Visit: Payer: Medicaid Other | Admitting: Occupational Therapy

## 2016-02-18 DIAGNOSIS — R278 Other lack of coordination: Secondary | ICD-10-CM

## 2016-02-18 DIAGNOSIS — R2681 Unsteadiness on feet: Secondary | ICD-10-CM | POA: Diagnosis present

## 2016-02-18 DIAGNOSIS — I69351 Hemiplegia and hemiparesis following cerebral infarction affecting right dominant side: Secondary | ICD-10-CM | POA: Insufficient documentation

## 2016-02-18 DIAGNOSIS — R2689 Other abnormalities of gait and mobility: Secondary | ICD-10-CM

## 2016-02-18 NOTE — Therapy (Signed)
[facility] [address] [place], [place], [address] Phone: [phone]   Fax:  [phone]  Physical Therapy Treatment  Patient Details  Name: Brad Tran MRN: [id number] Date of Birth: 19-Jul-1965 Referring Provider: [name], MD  Encounter Date: 02/16/2016   02/16/16 1241  PT Visits / Re-Eval  Visit Number 4  Number of Visits 9 (updated due to medicaid approval on 02/01/16)  Date for PT Re-Evaluation 03/10/16 (updated due to medicaid approval 02/01/16)  Authorization  Authorization Type Medicaid approved 8 visits from 01/29/16-03/10/16  Authorization - Visit Number 3  Authorization - Number of Visits 8  PT Time Calculation  PT Start Time 1235  PT Stop Time 1315  PT Time Calculation (min) 40 min  PT - End of Session  Activity Tolerance Patient tolerated treatment well  Behavior During Therapy [facility] for tasks assessed/performed     Past Medical History:  Diagnosis Date  . Hyperlipidemia   . Hypertension   . Stroke [facility])     History reviewed. No pertinent surgical history.  There were no vitals filed for this visit.     02/16/16 1240  Symptoms/Limitations  Subjective No new complaints. Still with right should pain when he moves it the wrong way. No new falls. Mostly wants to know if he can get into the sauna at the gym, deferred this question to Dr. Roda Shutters (has apt tomorrow 02/17/16)  Patient is accompained by: Family member  Pertinent History Pronouned "Choo-un" , daughter April   Limitations Walking;House hold activities  Pain Assessment  Currently in Pain? No/denies  Pain Score 0      02/16/16 1245  Transfers  Transfers Sit to Stand;Stand to Sit  Sit to Stand 6: Modified independent (Device/Increase time)  Stand to Sit 6: Modified independent (Device/Increase time)  Ambulation/Gait  Ambulation/Gait Yes  Ambulation/Gait Assistance 5: Supervision  Ambulation/Gait Assistance Details on  treadmill with bil UE support, except when reaching up to control treadmill. BP 152/95 afterwards, with HR 76-78 during. cues for increased right step length and for foot clearance with swing phase. BP after 3-4 minutes of rest: 153/97.                                           Ambulation Distance (Feet) 230 Feet (x1, 450 x1, + distance on treadmill at 0.8 mph)  Assistive device Straight cane (treadmill)  Gait Pattern Step-through pattern;Decreased arm swing - left;Decreased stance time - right;Decreased step length - left;Decreased stride length;Decreased dorsiflexion - right;Right steppage;Right genu recurvatum;Trunk flexed;Poor foot clearance - right  Ambulation Surface Level;Indoor       02/16/16 0822  PT Education  Education provided Yes  Education Details gym exericises: okay to use recumbent bikes, have someone with him for treadmill with limited time of no more than 10 minutes. Deferred sauna clearance to MD (see's neuro md this week)  Person(s) Educated Patient;Child(ren)  Methods Explanation;Demonstration;Verbal cues  Comprehension Verbalized understanding;Returned demonstration;Verbal cues required;Need further instruction           PT Short Term Goals - 02/01/16 1239      PT SHORT TERM GOAL #1   Title =LTG's due to new medicaid approval for 8 visits through 03/10/16           PT Long Term Goals - 02/01/16 1240      PT LONG TERM GOAL #1   Title  Pt will be independent with HEP in order to indicate improved functional mobility and decreased fall risk.  (Target date: 03/10/16-just got approval from Medicaid during visit on 02/01/16)   Baseline has 2 exercises, max verbal cues    Time 4   Period Weeks   Status New     PT LONG TERM GOAL #2   Title Pt will improve BERG balance test by 8 points from baseline in order to indicate decreased fall risk.    Baseline 46/56 on 02/01/16   Time 4   Period Weeks   Status On-going     PT LONG TERM GOAL #3   Title Pt will  improve gait speed to >2.62 ft/sec w/ LRAD in order to indicate pt is community ambulator.     Baseline 1.68 ft/sec on 12/07/15   Time 4   Period Weeks   Status On-going     PT LONG TERM GOAL #4   Title Pt will ambulate over varying outdoor surfaces (including grass, ramp, curb) x 500' w/ LRAD at mod I level in order to indicate safe return to community and leisure activities.     Baseline S over indoor surfaces with LBQC and R AFO   Time 4   Period Weeks   Status On-going     PT LONG TERM GOAL #5   Title Pt will perform floor transfer at mod I level in order to indicate safe fall recovery.     Baseline unable to assess due to time constraint   Time 4   Period Weeks   Status On-going     Additional Long Term Goals   Additional Long Term Goals Yes     PT LONG TERM GOAL #6   Title Pt will ambulate without AD, with brace up to 500' over indoor surfaces at mod I level in order to indicate improved independence with gait at home.    Baseline S to min/guard without brace on 02/01/16   Time 4   Period Weeks   Status New        02/16/16 1242  Plan  Clinical Impression Statement Today's skilled session addressed safety on treadmill for return to gym program. Pt able to safely get onto/off of treadmill, and adjust the settings while on it. Recommended someone with him for first few times for safety. BP did elevate with 10 mintues, so recommeded he not exced this time frame and to rest between machines. Deferred sauna use to MD due to cardiac issues as well. Remainder of session addressed gait with cane working on increased distances to assess for LE fatigue. No recurvatum noted today with use of brace and heel wedge. Pt is making steady progress toward goals and should benefit from continued PT to progress toward unmet goals.     Pt will benefit from skilled therapeutic intervention in order to improve on the following deficits Abnormal gait;Decreased balance;Decreased coordination;Decreased  knowledge of precautions;Decreased knowledge of use of DME;Decreased mobility;Decreased safety awareness;Decreased strength;Impaired perceived functional ability;Improper body mechanics;Postural dysfunction  Rehab Potential Good  Clinical Impairments Affecting Rehab Potential Medicaid visit limitations  PT Frequency 2x / week  PT Duration 6 weeks  PT Treatment/Interventions ADLs/Self Care Home Management;Electrical Stimulation;DME Instruction;Gait training;Stair training;Functional mobility training;Therapeutic activities;Therapeutic exercise;Balance training;Neuromuscular re-education;Patient/family education;Orthotic Fit/Training;Vestibular;Energy conservation  PT Next Visit Plan RLE NMR, try gait with SPC and without AD for challenge, quad control on RLE, increased R glute med activation during gait .  Consulted and Agree with Plan of Care Patient;Family member/caregiver  Family Member Consulted Daughter April           Patient will benefit from skilled therapeutic intervention in order to improve the following deficits and impairments:  Abnormal gait, Decreased balance, Decreased coordination, Decreased knowledge of precautions, Decreased knowledge of use of DME, Decreased mobility, Decreased safety awareness, Decreased strength, Impaired perceived functional ability, Improper body mechanics, Postural dysfunction  Visit Diagnosis: Hemiplegia and hemiparesis following cerebral infarction affecting right dominant side (HCC)  Unsteadiness on feet  Other abnormalities of gait and mobility     Problem List Patient Active Problem List   Diagnosis Date Noted  . Right hemiparesis (HCC)   . Myofascial muscle pain   . 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 Prediabetes   . Stroke (HCC) 10/25/2015  . CVA (cerebral infarction) 10/25/2015  . Stroke (cerebrum) (HCC) 10/25/2015  . Essential hypertension 10/25/2015  . Hyperlipidemia 10/25/2015  .  Acute ischemic stroke (HCC)   . Cerebral infarction due to unspecified mechanism   . Dysarthria     Sallyanne KusterKathy Bury, PTA, [facility] [address], [address] [place], [place] [address] [phone] 02/18/16, 8:35 AM   Name: Brad Tran MRN: [id number] Date of Birth: 05/03/1965

## 2016-02-18 NOTE — Therapy (Signed)
[facility] [address] [place], [place], [address] Phone: [phone]   Fax:  [phone]  Occupational Therapy Treatment  Patient Details  Name: Brad Tran MRN: [id number] Date of Birth: Aug 11, 1965 Referring Provider: Dr. [name]  Encounter Date: 02/18/2016      OT End of Session - 02/18/16 1009    Visit Number 5   Number of Visits 17   Date for OT Re-Evaluation 03/24/16   Authorization Type Medicaid approved 16 OT visits 01/29/16-03/24/16   Authorization - Visit Number 4   Authorization - Number of Visits 16   OT Start Time 0808   OT Stop Time 0848   OT Time Calculation (min) 40 min   Activity Tolerance Patient tolerated treatment well   Behavior During Therapy [facility] for tasks assessed/performed      Past Medical History:  Diagnosis Date  . Hyperlipidemia   . Hypertension   . Stroke [facility])     No past surgical history on file.  There were no vitals filed for this visit.      Subjective Assessment - 02/18/16 1007    Subjective  Pt denies pain at home   Patient is accompained by: Family member   Pertinent History small chronic infarct L thalamus, acute L basal ganglia nonhemorrhagic infarct   Limitations fall risk   Patient Stated Goals be able to use RUE   Currently in Pain? No/denies      In supine, self ROM shoulder flex with BUEs with min facilitation at scapula and elbow for improved positioning.    In supine, AAROM shoulder flex with PVC frame with min facilitation at scapula and elbow for improved positioning.  Chest press with ball with min cues/faciliation for proper positioning.  In supine, roll to R side for wt. Bearing and body on arm movements with min facilitation/cues.  In sitting, wt. Bearing through R hand on mat with body on arm movements with min cueing/faciliation for proper positioning/posture.  In sitting, AAROM shoulder flex low range with focus on elbow ext and  breaking out of flexor synergy pattern while holding cylinder object with min-mod cueing/faciliation (to target).  In standing, wt. Bearing through BUEs with forward/backward wt. Shifts with focus on scapular mobility and RUE stability (body on arm movements) with min facilitation/cues.  In standing, low range functional reach (forward and laterally) with mod cueing/facilitation for posture/avoiding flexor synergy pattern and for normal movement patterns to grasp/release of cylinder object (max difficulty with release and needed assist).                           OT Education - 02/18/16 1014    Education Details Proper positioning of LUE and avoiding compensation patterns   Person(s) Educated Patient;Child(ren)   Methods Explanation;Demonstration;Verbal cues   Comprehension Verbalized understanding;Returned demonstration;Verbal cues required;Need further instruction          OT Short Term Goals - 02/18/16 1011      OT SHORT TERM GOAL #1   Title Pt will be independent with initial HEP.--check STGs modified to 02/25/16 due to delay in [place] approval   Baseline reports only 1-2 exercises for RUE from home health   Time 4   Period Weeks   Status New     OT SHORT TERM GOAL #2   Title Pt will verbalize understanding of edema management techniques and proper positioning of RUE to prevent pain/injury.   Baseline dependent, min-mod edema in  R hand   Time 4   Period Weeks   Status New     OT SHORT TERM GOAL #3   Title Pt will be independent with splint wear/care prn for improved positioning.   Baseline no splint   Time 4   Period Weeks   Status New     OT SHORT TERM GOAL #4   Title Pt will demo at least 20* R shoulder flex in prep for functional reach.   Baseline no true active shoulder flex, approx 30* AAROM   Time 4   Period Weeks   Status New     OT SHORT TERM GOAL #5   Title Pt will be demo at least 25% gross finger flex in prep for functional grasp.    Baseline trace   Time 4   Period Weeks   Status Achieved  02/01/16     OT SHORT TERM GOAL #6   Title Pt will use RUE as stabilizer for selected tasks.   Baseline unable to use RUE functionally   Time 4   Period Weeks   Status New           OT Long Term Goals - 02/18/16 1013      OT LONG TERM GOAL #1   Title Pt will be independent with updated HEP--check LTGs 03/24/16 (modified due to delay with medicaid approval)   Baseline dependent   Time 8   Period Weeks   Status New     OT LONG TERM GOAL #2   Title Pt will perform simple cooking task mod I.   Baseline only warming food in microwave currently (family prepares)   Time 8   Period Weeks   Status New     OT LONG TERM GOAL #3   Title Pt will demo at least 30* R shoulder flex in prep for functional reach.   Baseline no true active shoulder flex, approx 30* AAROM   Time 8   Period Weeks   Status New     OT LONG TERM GOAL #4   Title Pt will use RUE as gross assist for selected tasks.   Baseline unable to use RUE functionally.   Time 8   Period Weeks   Status New     OT LONG TERM GOAL #5   Title Pt will be able to perform light weightbearing through RUE without pain to assist in transfers/IADLs.   Baseline unable to use RUE functionally.   Time 8   Period Weeks   Status New               Plan - 02/18/16 1009    Clinical Impression Statement Pt is proressing towards goals, but continues to report pain with improper positioning only as pain alleviated with improved positioning during session.  Pt needs mod cueing for proper positioning and avoiding compensation patterns (flexor synergy)   Rehab Potential Good   OT Frequency 2x / week   OT Duration 8 weeks   OT Treatment/Interventions Self-care/ADL training;Fluidtherapy;DME and/or AE instruction;Moist Heat;Splinting;Patient/family education;Therapeutic exercises;Contrast Bath;Ultrasound;Therapeutic exercise;Therapeutic activities;Passive range of  motion;Functional Mobility Training;Neuromuscular education;Cryotherapy;Electrical Stimulation;Parrafin;Manual Therapy;Energy conservation   Plan continue neuro re-ed, ?estim for finger ext   OT Home Exercise Plan Education provided:  12/07/15 edema managment, initial HEP; 02/01/16  added to HEP   Consulted and Agree with Plan of Care Patient;Family member/caregiver   Family Member Consulted daughter      Patient will benefit from skilled therapeutic intervention in order to improve the  following deficits and impairments:  Decreased mobility, Decreased strength, Impaired UE functional use, Decreased knowledge of use of DME, Impaired tone, Increased edema, Decreased coordination, Decreased range of motion, Decreased balance  Visit Diagnosis: Hemiplegia and hemiparesis following cerebral infarction affecting right dominant side (HCC)  Other lack of coordination  Other abnormalities of gait and mobility    Problem List Patient Active Problem List   Diagnosis Date Noted  . Right hemiparesis (HCC)   . Myofascial muscle pain   . 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 Prediabetes   . Stroke (HCC) 10/25/2015  . CVA (cerebral infarction) 10/25/2015  . Stroke (cerebrum) (HCC) 10/25/2015  . Essential hypertension 10/25/2015  . Hyperlipidemia 10/25/2015  . Acute ischemic stroke (HCC)   . Cerebral infarction due to unspecified mechanism   . Dysarthria     [facility] KansasFREEMAN,Asencion Loveday 02/18/2016, 10:15 AM  [facility] [address] [place], [place], [address] Phone: [phone]   Fax:  [phone]  Name: Brad BrooksChhoun Tran MRN: [id number] Date of Birth: 03/04/1966   Willa FraterAngela Novalee Horsfall, OTR/L [facility] [address] [place], [place]  [address] [phone] phone [phone] 02/18/16 10:16 AM

## 2016-02-18 NOTE — Therapy (Signed)
[facility] [address] [place], [place], [address] Phone: [phone]   Fax:  [phone]  Physical Therapy Treatment  Patient Details  Name: Brad BrooksChhoun Tran MRN: [id number] Date of Birth: 05/16/1965 Referring Provider: Claudette LawsAndrew Kirsteins, MD  Encounter Date: 02/18/2016      PT End of Session - 02/18/16 1412    Visit Number 5   Number of Visits 9  updated due to medicaid approval on 02/01/16   Date for PT Re-Evaluation 03/10/16  updated due to medicaid approval 02/01/16   Authorization Type Medicaid approved 8 visits from 01/29/16-03/10/16   Authorization - Visit Number 4   Authorization - Number of Visits 8   PT Start Time 0847   PT Stop Time 0930   PT Time Calculation (min) 43 min   Activity Tolerance Patient tolerated treatment well   Behavior During Therapy [facility] for tasks assessed/performed      Past Medical History:  Diagnosis Date  . Hyperlipidemia   . Hypertension   . Stroke [facility](HCC)     History reviewed. No pertinent surgical history.  There were no vitals filed for this visit.      Subjective Assessment - 02/18/16 0905    Subjective No complaints, no falls.     Patient is accompained by: Family member   Pertinent History Pronouned "Choo-un" , daughter April    Limitations Walking;House hold activities   Currently in Pain? No/denies   Pain Score 0-No pain                         [facility] Adult PT Treatment/Exercise - 02/18/16 0926      Transfers   Transfers Sit to Stand;Stand to Sit   Sit to Stand 6: Modified independent (Device/Increase time)   Stand to Sit 6: Modified independent (Device/Increase time)     Ambulation/Gait   Ambulation/Gait Yes   Ambulation/Gait Assistance 5: Supervision;4: Min guard  min/guard for facilitation   Ambulation/Gait Assistance Details Had pt ambulate during session x 230' without AD with R AFO donned.   Note pt able to ambulate at S to mod I level,  however provided min cues and light facilitation for improved posture, decreased trunk rotation and improved forward lateral weight shift over RLE during stance.  Also provided cues for increased attention to R knee control.  Note that larger wedge does assist with decreased genu recurvatum, however note that when pt slows gait speed and attends to this, he can also better control recurvatum.     Ambulation Distance (Feet) 230 Feet   Assistive device None   Gait Pattern Step-through pattern;Decreased arm swing - left;Decreased stance time - right;Decreased step length - left;Decreased stride length;Decreased dorsiflexion - right;Right steppage;Right genu recurvatum;Trunk flexed;Poor foot clearance - right   Ambulation Surface Level;Indoor     Self-Care   Self-Care Other Self-Care Comments   Other Self-Care Comments  Discussion regarding equipment use at gym and limiting to 10 mins at a time due to last session BP elevating with 10 mins of activity.  Also educated about limiting time in sauna and taking a break prior to getting onto treadmill to ensure BP has reduced to safe level.  Pt and daughter verbalized understanding.      Neuro Re-ed    Neuro Re-ed Details  Sit<>partial sit x 10 reps with cues for increased R lateral weight shift during task, tall kneeling task transitioning to half kneeling and back for NMR and proximal control  in RLE. Performed x 10 reps with cues for improved R hip protraction                 PT Education - 02/18/16 0906    Education provided Yes   Education Details Continue to go over gym exercises.  Per daughter and pt, MD okayed use of Sauna, PT recoommends using sparingly maybe only 5 mins and ensuring that he rests prior to doing more cardio work out.     Person(s) Educated Patient;Child(ren)   Methods Explanation   Comprehension Verbalized understanding          PT Short Term Goals - 02/01/16 1239      PT SHORT TERM GOAL #1   Title =LTG's due to  new medicaid approval for 8 visits through 03/10/16           PT Long Term Goals - 02/01/16 1240      PT LONG TERM GOAL #1   Title Pt will be independent with HEP in order to indicate improved functional mobility and decreased fall risk.  (Target date: 03/10/16-just got approval from Medicaid during visit on 02/01/16)   Baseline has 2 exercises, max verbal cues    Time 4   Period Weeks   Status New     PT LONG TERM GOAL #2   Title Pt will improve BERG balance test by 8 points from baseline in order to indicate decreased fall risk.    Baseline 46/56 on 02/01/16   Time 4   Period Weeks   Status On-going     PT LONG TERM GOAL #3   Title Pt will improve gait speed to >2.62 ft/sec w/ LRAD in order to indicate pt is community ambulator.     Baseline 1.68 ft/sec on 12/07/15   Time 4   Period Weeks   Status On-going     PT LONG TERM GOAL #4   Title Pt will ambulate over varying outdoor surfaces (including grass, ramp, curb) x 500' w/ LRAD at mod I level in order to indicate safe return to community and leisure activities.     Baseline S over indoor surfaces with LBQC and R AFO   Time 4   Period Weeks   Status On-going     PT LONG TERM GOAL #5   Title Pt will perform floor transfer at mod I level in order to indicate safe fall recovery.     Baseline unable to assess due to time constraint   Time 4   Period Weeks   Status On-going     Additional Long Term Goals   Additional Long Term Goals Yes     PT LONG TERM GOAL #6   Title Pt will ambulate without AD, with brace up to 500' over indoor surfaces at mod I level in order to indicate improved independence with gait at home.    Baseline S to min/guard without brace on 02/01/16   Time 4   Period Weeks   Status New               Plan - 02/18/16 1610    Clinical Impression Statement Skilled session focused on gait without device in order to better challenge balance and work on improved postural control during gait as  well as NMR for RLE.     Rehab Potential Good   Clinical Impairments Affecting Rehab Potential Medicaid visit limitations   PT Frequency 2x / week   PT Duration 6 weeks   PT  Treatment/Interventions ADLs/Self Care Home Management;Electrical Stimulation;DME Instruction;Gait training;Stair training;Functional mobility training;Therapeutic activities;Therapeutic exercise;Balance training;Neuromuscular re-education;Patient/family education;Orthotic Fit/Training;Vestibular;Energy conservation   PT Next Visit Plan gait outdoors without AD, R quad control/NMR   Consulted and Agree with Plan of Care Patient;Family member/caregiver   Family Member Consulted Daughter April       Patient will benefit from skilled therapeutic intervention in order to improve the following deficits and impairments:  Abnormal gait, Decreased balance, Decreased coordination, Decreased knowledge of precautions, Decreased knowledge of use of DME, Decreased mobility, Decreased safety awareness, Decreased strength, Impaired perceived functional ability, Improper body mechanics, Postural dysfunction  Visit Diagnosis: Hemiplegia and hemiparesis following cerebral infarction affecting right dominant side (HCC)  Unsteadiness on feet  Other abnormalities of gait and mobility     Problem List Patient Active Problem List   Diagnosis Date Noted  . Right hemiparesis (HCC)   . Myofascial muscle pain   . 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 Prediabetes   . Stroke (HCC) 10/25/2015  . CVA (cerebral infarction) 10/25/2015  . Stroke (cerebrum) (HCC) 10/25/2015  . Essential hypertension 10/25/2015  . Hyperlipidemia 10/25/2015  . Acute ischemic stroke (HCC)   . Cerebral infarction due to unspecified mechanism   . Dysarthria     Harriet ButteEmily Alonnie Bieker, PT, MPT [facility] [address] [place], [place], [address] Phone: [phone]   Fax:   [phone] 02/18/16, 2:40 PM  Name: Brad BrooksChhoun Forehand MRN: [id number] Date of Birth: 11/21/1965

## 2016-02-22 ENCOUNTER — Encounter: Payer: Self-pay | Admitting: Rehabilitation

## 2016-02-22 ENCOUNTER — Ambulatory Visit: Payer: Medicaid Other | Admitting: Rehabilitation

## 2016-02-22 ENCOUNTER — Ambulatory Visit: Payer: Medicaid Other | Admitting: Occupational Therapy

## 2016-02-22 DIAGNOSIS — R2689 Other abnormalities of gait and mobility: Secondary | ICD-10-CM

## 2016-02-22 DIAGNOSIS — I69351 Hemiplegia and hemiparesis following cerebral infarction affecting right dominant side: Secondary | ICD-10-CM | POA: Diagnosis not present

## 2016-02-22 DIAGNOSIS — R278 Other lack of coordination: Secondary | ICD-10-CM

## 2016-02-22 DIAGNOSIS — R2681 Unsteadiness on feet: Secondary | ICD-10-CM

## 2016-02-22 NOTE — Therapy (Signed)
[facility] [address] [place], [place], [address] Phone: [phone]   Fax:  [phone]  Physical Therapy Treatment  Patient Details  Name: Brad Tran MRN: [id number] Date of Birth: 10/10/1965 Referring Provider: Claudette LawsAndrew Kirsteins, MD  Encounter Date: 02/22/2016      PT End of Session - 02/22/16 1448    Visit Number 6   Number of Visits 9  updated due to medicaid approval on 02/01/16   Date for PT Re-Evaluation 03/10/16  updated due to medicaid approval 02/01/16   Authorization Type Medicaid approved 8 visits from 01/29/16-03/10/16   Authorization - Visit Number 5   Authorization - Number of Visits 8   PT Start Time 1447   PT Stop Time 1530   PT Time Calculation (min) 43 min   Activity Tolerance Patient tolerated treatment well   Behavior During Therapy [facility] for tasks assessed/performed      Past Medical History:  Diagnosis Date  . Hyperlipidemia   . Hypertension   . Stroke [facility](HCC)     History reviewed. No pertinent surgical history.  There were no vitals filed for this visit.      Subjective Assessment - 02/22/16 1448    Subjective "I went to the sauna."  Per daughter, felt ok and had no BP changes.     Patient is accompained by: Family member   Pertinent History Pronouned "Choo-un" , daughter April    Limitations Walking;House hold activities   Currently in Pain? No/denies                         [facility] Treatment/Exercise - 02/22/16 0001      Ambulation/Gait   Ambulation/Gait Yes   Ambulation/Gait Assistance 6: Modified independent (Device/Increase time);5: Supervision   Ambulation/Gait Assistance Details Mod I for indoor surfaces up to 200' during session without AD.  Performed gait outdoors over unlevel paved surfaces as well as over grassy surface.  Note that he is mod I over paved surfaces, but over grassy surfaces requires S and also note increased supination at ankle  however he was able to correct with cues.  Recommend S for grassy surfaces at this time.  Performed gait up to 500' outdoors.    Assistive device None   Gait Pattern Step-through pattern;Decreased arm swing - left;Decreased stance time - right;Decreased step length - left;Decreased stride length;Decreased dorsiflexion - right;Right steppage;Right genu recurvatum;Trunk flexed;Poor foot clearance - right   Ambulation Surface Level;Unlevel;Indoor;Outdoor;Paved;Grass   Gait Comments Note slightly increased spasticity in RLE, esp at ankle during gait.  Esp noted increased ankle inversion during swing and stance phase of gait.  OT also noting increased spasticity in UE, therefore has sent message to MD.       Self-Care   Self-Care Other Self-Care Comments   Other Self-Care Comments  Discussed increased spasticity in LE and how to decrease with WB and stretching.  Pt verbalized understanding.       Neuro Re-ed    Neuro Re-ed Details  quadruped NMR for core and proximal stability as well as RUE/LE WB.  While in quadruped had pt move LUE forward and back x 10 reps, shifting forward and backward x 10 reps and ending with alternating LEs with PT providing facilitation and assist at R axilla and elbow for appropriate positioning.  Cues for improved R lateral weight shift and decreased trunk rotation.  Posterior and lateral lunges x 10 reps with L foot on pillow  case for decreased friction.  Seated nustep x 5 mins at level 6 resistance with BLEs only with focus on quality movement in RLE.                  PT Education - 02/22/16 2005    Education provided Yes   Education Details discussed increasing tone/spasticity in RLE/UE   Person(s) Educated Patient;Child(ren)   Methods Explanation   Comprehension Verbalized understanding          PT Short Term Goals - 02/01/16 1239      PT SHORT TERM GOAL #1   Title =LTG's due to new medicaid approval for 8 visits through 03/10/16           PT  Long Term Goals - 02/01/16 1240      PT LONG TERM GOAL #1   Title Pt will be independent with HEP in order to indicate improved functional mobility and decreased fall risk.  (Target date: 03/10/16-just got approval from Medicaid during visit on 02/01/16)   Baseline has 2 exercises, max verbal cues    Time 4   Period Weeks   Status New     PT LONG TERM GOAL #2   Title Pt will improve BERG balance test by 8 points from baseline in order to indicate decreased fall risk.    Baseline 46/56 on 02/01/16   Time 4   Period Weeks   Status On-going     PT LONG TERM GOAL #3   Title Pt will improve gait speed to >2.62 ft/sec w/ LRAD in order to indicate pt is community ambulator.     Baseline 1.68 ft/sec on 12/07/15   Time 4   Period Weeks   Status On-going     PT LONG TERM GOAL #4   Title Pt will ambulate over varying outdoor surfaces (including grass, ramp, curb) x 500' w/ LRAD at mod I level in order to indicate safe return to community and leisure activities.     Baseline S over indoor surfaces with LBQC and R AFO   Time 4   Period Weeks   Status On-going     PT LONG TERM GOAL #5   Title Pt will perform floor transfer at mod I level in order to indicate safe fall recovery.     Baseline unable to assess due to time constraint   Time 4   Period Weeks   Status On-going     Additional Long Term Goals   Additional Long Term Goals Yes     PT LONG TERM GOAL #6   Title Pt will ambulate without AD, with brace up to 500' over indoor surfaces at mod I level in order to indicate improved independence with gait at home.    Baseline S to min/guard without brace on 02/01/16   Time 4   Period Weeks   Status New               Plan - 02/22/16 1448    Clinical Impression Statement Skilled session focused on gait without device over outdoor surfaces.  Pt safe to ambulate at mod I level on paved surfaces, however recommend S for grassy surfaces.  Note somewhat increased spasticity in RLE  during gait.  Discussed with pt/dtr and OT and MD to be notified.     Rehab Potential Good   Clinical Impairments Affecting Rehab Potential Medicaid visit limitations   PT Frequency 2x / week   PT Duration 6 weeks   PT  Treatment/Interventions ADLs/Self Care Home Management;Electrical Stimulation;DME Instruction;Gait training;Stair training;Functional mobility training;Therapeutic activities;Therapeutic exercise;Balance training;Neuromuscular re-education;Patient/family education;Orthotic Fit/Training;Vestibular;Energy conservation   PT Next Visit Plan gait outdoors without AD, R quad control/NMR   Consulted and Agree with Plan of Care Patient;Family member/caregiver   Family Member Consulted Daughter April       Patient will benefit from skilled therapeutic intervention in order to improve the following deficits and impairments:  Abnormal gait, Decreased balance, Decreased coordination, Decreased knowledge of precautions, Decreased knowledge of use of DME, Decreased mobility, Decreased safety awareness, Decreased strength, Impaired perceived functional ability, Improper body mechanics, Postural dysfunction  Visit Diagnosis: Hemiplegia and hemiparesis following cerebral infarction affecting right dominant side (HCC)  Other abnormalities of gait and mobility  Unsteadiness on feet     Problem List Patient Active Problem List   Diagnosis Date Noted  . Right hemiparesis (HCC)   . Myofascial muscle pain   . 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 Prediabetes   . Stroke (HCC) 10/25/2015  . CVA (cerebral infarction) 10/25/2015  . Stroke (cerebrum) (HCC) 10/25/2015  . Essential hypertension 10/25/2015  . Hyperlipidemia 10/25/2015  . Acute ischemic stroke (HCC)   . Cerebral infarction due to unspecified mechanism   . Dysarthria     Harriet Butte, PT, MPT [facility] [address] [place], [place],  [address] Phone: [phone]   Fax:  [phone] 02/22/16, 8:12 PM  Name: Brad Tran MRN: [id number] Date of Birth: 01/28/1966

## 2016-02-22 NOTE — Therapy (Signed)
[facility] [address] [place], [place], [address] Phone: [phone]   Fax:  [phone]  Occupational Therapy Treatment  Patient Details  Name: Brad Tran MRN: [id number] Date of Birth: 08/02/1965 Referring Provider: Dr. Claudette LawsAndrew Kirsteins  Encounter Date: 02/22/2016      OT End of Session - 02/22/16 1647    Visit Number 6   Number of Visits 17   Date for OT Re-Evaluation 03/24/16   Authorization Type Medicaid approved 16 OT visits 01/29/16-03/24/16   Authorization - Visit Number 5   Authorization - Number of Visits 16   OT Start Time 1339   OT Stop Time 1421   OT Time Calculation (min) 42 min   Activity Tolerance Patient tolerated treatment well   Behavior During Therapy [facility] for tasks assessed/performed      Past Medical History:  Diagnosis Date  . Hyperlipidemia   . Hypertension   . Stroke [facility](HCC)     No past surgical history on file.  There were no vitals filed for this visit.      Subjective Assessment - 02/22/16 1644    Subjective  Pt denies pain at home, but initially reports pain with RUE extension which appears to be due to spasticity   Patient is accompained by: Family member   Pertinent History small chronic infarct L thalamus, acute L basal ganglia nonhemorrhagic infarct   Limitations fall risk   Patient Stated Goals be able to use RUE   Currently in Pain? No/denies        In supine, AAROM shoulder flex with PVC frame with min-mod facilitation at scapula and elbow for improved positioning.  Chest press with PVC frame with min cues/faciliation for proper positioning.  In supine, roll to R side for wt. Bearing and body on arm movements with min facilitation/cues.  Then worked on rolling for supine>sit using normal movement patterns with min cueing.  In sitting, wt. Bearing through R hand on mat with body on arm movements with min cueing/faciliation for proper positioning/posture.  In  sitting, AAROM shoulder flex with PVC frame with min-mod facilitation and cues for normal movement patterns.  Instructed pt to perform wt. Bearing in standing while performing ADLs such as brushing teeth, shaving, etc.  In standing, low range functional reach (forward and laterally) with mod cueing/facilitation for posture/avoiding flexor synergy pattern and for normal movement patterns to grasp/release of small cylinder pegs (mod-max difficulty with release with stretching intermittently).  In prone, wt. Bearing through elbows with head/chest lifts for incr scapular stability/depression with min cues/faciliation.  In prone, scapular retraction with BUEs with mod cueing/facilitation.                       OT Education - 02/22/16 1645    Education Details Normal movement patterns; recommended pt/dtr contact Dr. Wynn BankerKirsteins' office due to incr spasticity   Person(s) Educated Patient;Child(ren)   Methods Explanation   Comprehension Verbalized understanding          OT Short Term Goals - 02/18/16 1011      OT SHORT TERM GOAL #1   Title Pt will be independent with initial HEP.--check STGs modified to 02/25/16 [place]   Baseline reports only 1-2 exercises for RUE from home health   Time 4   Period Weeks   Status New     OT SHORT TERM GOAL #2   Title Pt will verbalize understanding of edema management techniques and  proper positioning of RUE to prevent pain/injury.   Baseline dependent, min-mod edema in R hand   Time 4   Period Weeks   Status New     OT SHORT TERM GOAL #3   Title Pt will be independent with splint wear/care prn for improved positioning.   Baseline no splint   Time 4   Period Weeks   Status New     OT SHORT TERM GOAL #4   Title Pt will demo at least 20* R shoulder flex in prep for functional reach.   Baseline no true active shoulder flex, approx 30* AAROM   Time 4   Period Weeks   Status New     OT SHORT TERM GOAL #5    Title Pt will be demo at least 25% gross finger flex in prep for functional grasp.   Baseline trace   Time 4   Period Weeks   Status Achieved  02/01/16     OT SHORT TERM GOAL #6   Title Pt will use RUE as stabilizer for selected tasks.   Baseline unable to use RUE functionally   Time 4   Period Weeks   Status New           OT Long Term Goals - 02/18/16 1013      OT LONG TERM GOAL #1   Title Pt will be independent with updated HEP--check LTGs 03/24/16 (modified due to delay with medicaid approval)   Baseline dependent   Time 8   Period Weeks   Status New     OT LONG TERM GOAL #2   Title Pt will perform simple cooking task mod I.   Baseline only warming food in microwave currently (family prepares)   Time 8   Period Weeks   Status New     OT LONG TERM GOAL #3   Title Pt will demo at least 30* R shoulder flex in prep for functional reach.   Baseline no true active shoulder flex, approx 30* AAROM   Time 8   Period Weeks   Status New     OT LONG TERM GOAL #4   Title Pt will use RUE as gross assist for selected tasks.   Baseline unable to use RUE functionally.   Time 8   Period Weeks   Status New     OT LONG TERM GOAL #5   Title Pt will be able to perform light weightbearing through RUE without pain to assist in transfers/IADLs.   Baseline unable to use RUE functionally.   Time 8   Period Weeks   Status New               Plan - 02/22/16 1648    Clinical Impression Statement Pt demo incr spasticity which is impacting attempts at RUE functional use.  Pt does respond well to wt. bearing and stretching, but requires a significant amount of strategies prior to functional use.   Rehab Potential Good   OT Frequency 2x / week   OT Duration 8 weeks   OT Treatment/Interventions Self-care/ADL training;Fluidtherapy;DME and/or AE instruction;Moist Heat;Splinting;Patient/family education;Therapeutic exercises;Contrast Bath;Ultrasound;Therapeutic  exercise;Therapeutic activities;Passive range of motion;Functional Mobility Training;Neuromuscular education;Cryotherapy;Electrical Stimulation;Parrafin;Manual Therapy;Energy conservation   Plan continue neuro re-ed, begin checking STGs   OT Home Exercise Plan Education provided:  12/07/15 edema managment, initial HEP; 02/01/16  added to HEP   Consulted and Agree with Plan of Care Patient;Family member/caregiver   Family Member Consulted daughter      Patient will  benefit from skilled therapeutic intervention in order to improve the following deficits and impairments:  Decreased mobility, Decreased strength, Impaired UE functional use, Decreased knowledge of use of DME, Impaired tone, Increased edema, Decreased coordination, Decreased range of motion, Decreased balance  Visit Diagnosis: Hemiplegia and hemiparesis following cerebral infarction affecting right dominant side (HCC)  Other lack of coordination  Other abnormalities of gait and mobility    Problem List Patient Active Problem List   Diagnosis Date Noted  . Right hemiparesis (HCC)   . Myofascial muscle pain   . 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 Prediabetes   . Stroke (HCC) 10/25/2015  . CVA (cerebral infarction) 10/25/2015  . Stroke (cerebrum) (HCC) 10/25/2015  . Essential hypertension 10/25/2015  . Hyperlipidemia 10/25/2015  . Acute ischemic stroke (HCC)   . Cerebral infarction due to unspecified mechanism   . Dysarthria     [facility] 02/22/2016, 4:50 PM  [facility] [address] [place], [place], [address] Phone: [phone]   Fax:  [phone]  Name: Brad Tran MRN: [id number] Date of Birth: 1966-04-09   Willa Frater, OTR/L [facility] [address] [place], [place]  [address] [phone] phone [phone] 02/22/16 4:51 PM

## 2016-02-23 ENCOUNTER — Telehealth: Payer: Self-pay | Admitting: *Deleted

## 2016-02-23 NOTE — Telephone Encounter (Signed)
-----   Message from Erick ColaceAndrew E Kirsteins, MD sent at 02/22/2016  7:58 PM EST ----- Regarding: RE: incr spasticity Can we get him in for eval ASAP, may need to do injection Thanks ----- Message ----- From: Marlis EdelsonAngela D Freeman, OT Sent: 02/22/2016   5:08 PM To: Erick ColaceAndrew E Kirsteins, MD, Elissa HeftyJason M Nicoletta Subject: incr spasticity                                Dr. Wynn BankerKirsteins,   Mr. Brad Tran has improved active movement of RUE, but is also having increasing spasticity over the last few weeks.  He may benefit from medical management of spasticity as it is interfering with attempts of RUE functional use.  He is not scheduled with you until December; however, unfortunately he is only approved by Medicaid to be seen by OT through 03/27/16.  PT also reports increasing spasticity in RLE, but it is not affecting function as much.  I just wanted to make you aware to see if this could be address while he was still seeing OT.  Thanks,  Willa FraterAngela Freeman, OTR/L [facility] [address] [place], [place]  [address] [phone] phone [phone] 02/22/16 5:19 PM

## 2016-02-23 NOTE — Telephone Encounter (Signed)
Called patient and LVM for pt to call and set up an appt. ((30 min appt in case an injection is needed ))

## 2016-02-24 ENCOUNTER — Telehealth: Payer: Self-pay

## 2016-02-24 NOTE — Telephone Encounter (Signed)
Patient's daughter called wanting to reschdule her dad's  appointment an be seen sooner, advise daughter that Brad Tran has an appointment schedule for 02/26/2016 @ 11am to see Dr. Wynn BankerKirsteins.  Appointment date and time Confirmed

## 2016-02-25 ENCOUNTER — Encounter: Payer: Self-pay | Admitting: Rehabilitation

## 2016-02-25 ENCOUNTER — Ambulatory Visit: Payer: Medicaid Other | Admitting: Rehabilitation

## 2016-02-25 ENCOUNTER — Ambulatory Visit: Payer: Medicaid Other | Admitting: Occupational Therapy

## 2016-02-25 DIAGNOSIS — R278 Other lack of coordination: Secondary | ICD-10-CM

## 2016-02-25 DIAGNOSIS — I69351 Hemiplegia and hemiparesis following cerebral infarction affecting right dominant side: Secondary | ICD-10-CM | POA: Diagnosis not present

## 2016-02-25 DIAGNOSIS — R2689 Other abnormalities of gait and mobility: Secondary | ICD-10-CM

## 2016-02-25 DIAGNOSIS — R2681 Unsteadiness on feet: Secondary | ICD-10-CM

## 2016-02-25 NOTE — Therapy (Signed)
[facility] [address] [place], [place], [address] Phone: [phone]   Fax:  [phone]  Occupational Therapy Treatment  Patient Details  Name: Brad Tran MRN: [id number] Date of Birth: Jun 26, 1965 Referring Provider: Dr. [name]  Encounter Date: 02/25/2016      OT End of Session - 02/25/16 1318    Visit Number 7   Number of Visits 17   Date for OT Re-Evaluation 03/24/16   Authorization Type Medicaid approved 16 OT visits 01/29/16-03/24/16   Authorization - Visit Number 6   Authorization - Number of Visits 16   OT Start Time 1318   OT Stop Time 1400   OT Time Calculation (min) 42 min   Activity Tolerance Patient tolerated treatment well   Behavior During Therapy [facility] for tasks assessed/performed      Past Medical History:  Diagnosis Date  . Hyperlipidemia   . Hypertension   . Stroke [facility])     No past surgical history on file.  There were no vitals filed for this visit.      Subjective Assessment - 02/25/16 1317    Subjective  "It's easier if I relax"   Patient is accompained by: Family member   Pertinent History small chronic infarct L thalamus, acute L basal ganglia nonhemorrhagic infarct   Limitations fall risk   Patient Stated Goals be able to use RUE   Currently in Pain? No/denies       In supine, AAROM shoulder flex with PVC frame with min-mod facilitation at  elbow for improved positioning.  Shoulder abduction with cane with min-mod facilitation for positioning/normal movement patterns.  In sitting, wt. Bearing through R hand on mat with body on arm movements with min cueing for proper positioning/posture.  In sitting, AAROM shoulder flex with PVC frame with min-mod facilitation and cues for normal movement patterns.  ER with cane with mod facilitation/cues for isolated movement and avoiding compensation.  In sitting, with UEs place in slight ER/low range abduction, pt performed  scapular retraction/depression with min-mod facilitation and min cues.  In standing, low range functional reach (forward and laterally) with min-mod cueing/facilitation for posture/avoiding flexor synergy pattern and for normal movement patterns to grasp/release of small cylinder pegs.  Pt instructed/cued for intentional relaxation of shoulder/elbow.  Pt improved with repetition.  Manual:  Gentle joint mobs to R shoulder.                         OT Education - 02/25/16 1558    Education Details Importance of relaxation and avoiding compensation patterns, particularly elbow flex and shoulder IR (as it can contribute to bicep tendonitis/impingement and pain)   Person(s) Educated Patient;Child(ren)   Methods Explanation;Demonstration;Verbal cues;Tactile cues   Comprehension Verbalized understanding;Returned demonstration;Verbal cues required;Need further instruction;Tactile cues required  improved during session          OT Short Term Goals - 02/25/16 1603      OT SHORT TERM GOAL #1   Title Pt will be independent with initial HEP.--check STGs modified to 02/25/16 due to delay in [place] approval   Baseline reports only 1-2 exercises for RUE from home health   Time 4   Period Weeks   Status Achieved     OT SHORT TERM GOAL #2   Title Pt will verbalize understanding of edema management techniques and proper positioning of RUE to prevent pain/injury.   Baseline dependent, min-mod edema in R hand   Time 4  Period Weeks   Status On-going  02/25/16  needs min cueing, improves with positioning      OT SHORT TERM GOAL #3   Title Pt will be independent with splint wear/care prn for improved positioning.   Baseline no splint   Time 4   Period Weeks   Status Deferred  02/25/16 at this time as pt responds well to wt. bearing (may consider full composite extension splint prn)     OT SHORT TERM GOAL #4   Title Pt will demo at least 20* R shoulder flex in prep for functional  reach.   Baseline no true active shoulder flex, approx 30* AAROM   Time 4   Period Weeks   Status New     OT SHORT TERM GOAL #5   Title Pt will be demo at least 25% gross finger flex in prep for functional grasp.   Baseline trace   Time 4   Period Weeks   Status Achieved  02/01/16     OT SHORT TERM GOAL #6   Title Pt will use RUE as stabilizer for selected tasks.   Baseline unable to use RUE functionally   Time 4   Period Weeks   Status On-going  02/25/16            OT Long Term Goals - 02/18/16 1013      OT LONG TERM GOAL #1   Title Pt will be independent with updated HEP--check LTGs 03/24/16 (modified due to delay with medicaid approval)   Baseline dependent   Time 8   Period Weeks   Status New     OT LONG TERM GOAL #2   Title Pt will perform simple cooking task mod I.   Baseline only warming food in microwave currently (family prepares)   Time 8   Period Weeks   Status New     OT LONG TERM GOAL #3   Title Pt will demo at least 30* R shoulder flex in prep for functional reach.   Baseline no true active shoulder flex, approx 30* AAROM   Time 8   Period Weeks   Status New     OT LONG TERM GOAL #4   Title Pt will use RUE as gross assist for selected tasks.   Baseline unable to use RUE functionally.   Time 8   Period Weeks   Status New     OT LONG TERM GOAL #5   Title Pt will be able to perform light weightbearing through RUE without pain to assist in transfers/IADLs.   Baseline unable to use RUE functionally.   Time 8   Period Weeks   Status New               Plan - 02/25/16 1601    Clinical Impression Statement Pt with incr spasticity but does respond well to wt. bearing and stretching.  However, spasticity (with IR, elbow flex) appears to be contributing to shoulder pain as it is alleviated with positioning, active relaxation, and spasticity management strategies.  Improved low range functional reaching and grasp/release today.   Rehab  Potential Good   OT Frequency 2x / week   OT Duration 8 weeks   OT Treatment/Interventions Self-care/ADL training;Fluidtherapy;DME and/or AE instruction;Moist Heat;Splinting;Patient/family education;Therapeutic exercises;Contrast Bath;Ultrasound;Therapeutic exercise;Therapeutic activities;Passive range of motion;Functional Mobility Training;Neuromuscular education;Cryotherapy;Electrical Stimulation;Parrafin;Manual Therapy;Energy conservation   Plan continue with neuro re-ed, check STGs   OT Home Exercise Plan Education provided:  12/07/15 edema managment, initial HEP; 02/01/16  added to  HEP   Consulted and Agree with Plan of Care Patient;Family member/caregiver   Family Member Consulted daughter      Patient will benefit from skilled therapeutic intervention in order to improve the following deficits and impairments:  Decreased mobility, Decreased strength, Impaired UE functional use, Decreased knowledge of use of DME, Impaired tone, Increased edema, Decreased coordination, Decreased range of motion, Decreased balance  Visit Diagnosis: Hemiplegia and hemiparesis following cerebral infarction affecting right dominant side (HCC)  Other lack of coordination  Other abnormalities of gait and mobility  Unsteadiness on feet    Problem List Patient Active Problem List   Diagnosis Date Noted  . Right hemiparesis (HCC)   . Myofascial muscle pain   . 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 Prediabetes   . Stroke (HCC) 10/25/2015  . CVA (cerebral infarction) 10/25/2015  . Stroke (cerebrum) (HCC) 10/25/2015  . Essential hypertension 10/25/2015  . Hyperlipidemia 10/25/2015  . Acute ischemic stroke (HCC)   . Cerebral infarction due to unspecified mechanism   . Dysarthria     [facility],ANGELA 02/25/2016, 4:06 PM  [facility] [address] [place], [place], [address] Phone: [phone]   Fax:   [phone]  Name: Donita BrooksChhoun Tiso MRN: [id number] Date of Birth: 12/26/1965   Willa FraterAngela Freeman, OTR/L [facility] [address] [place], [place]  [address] [phone] phone [phone] 02/25/16 4:06 PM

## 2016-02-25 NOTE — Therapy (Signed)
[facility] [address] [place], [place], [address] Phone: [phone]   Fax:  [phone]  Physical Therapy Treatment  Patient Details  Name: Brad BrooksChhoun Tran MRN: [id number] Date of Birth: 12/11/1965 Referring Provider: Claudette LawsAndrew Kirsteins, MD  Encounter Date: 02/25/2016      PT End of Session - 02/25/16 1407    Visit Number 7   Number of Visits 9  updated due to medicaid approval on 02/01/16   Date for PT Re-Evaluation 03/10/16  updated due to medicaid approval 02/01/16   Authorization Type Medicaid approved 8 visits from 01/29/16-03/10/16   Authorization - Visit Number 5   Authorization - Number of Visits 8   PT Start Time 1400   PT Stop Time 1445   PT Time Calculation (min) 45 min   Activity Tolerance Patient tolerated treatment well   Behavior During Therapy [facility] for tasks assessed/performed      Past Medical History:  Diagnosis Date  . Hyperlipidemia   . Hypertension   . Stroke [facility](HCC)     History reviewed. No pertinent surgical history.  There were no vitals filed for this visit.      Subjective Assessment - 02/25/16 1406    Subjective "I am doing okay"  No changes since last visit.     Patient is accompained by: Family member   Pertinent History Pronouned "Choo-un" , daughter Brad Tran    Limitations Walking;House hold activities   Currently in Pain? No/denies                         [facility] Adult PT Treatment/Exercise - 02/25/16 1430      Ambulation/Gait   Ambulation/Gait Yes   Ambulation/Gait Assistance 5: Supervision;4: Min assist   Ambulation/Gait Assistance Details gait without device in order to continue to work on Electrical engineerimporved gait mechanics.  Provided min facilitaiton for decreaed trunk rotation, improved R hip protraction during R stance and improved stride to allow for equal step lengths that are smoother.  Once this was achieved worked on increasing gait speed.   Note some difficulty  maintaining corrections, but overall did well.     Ambulation Distance (Feet) 345 Feet  115   Assistive device None   Gait Pattern Step-through pattern;Decreased arm swing - left;Decreased stance time - right;Decreased step length - left;Decreased stride length;Decreased dorsiflexion - right;Right steppage;Right genu recurvatum;Trunk flexed;Poor foot clearance - right   Ambulation Surface Level;Indoor     Neuro Re-ed    Neuro Re-ed Details  NMR for RLE with transitions from tall kneeling>half kneeling>standing and vice versa with RLE to increase proximal stability and WB through RLE.   Performed x 5 reps with min A for safety.       Exercises   Exercises Other Exercises   Other Exercises  Performed seated gastroc and ankle stretch with belt x 2 reps of 2 mins, seated toe flexor stretch x 2 reps of 2 mins.  Provided these exercises for home.                  PT Education - 02/25/16 1407    Education provided Yes   Education Details stretches for foot/ankle (gastroc) and toe flexors due to increased tone/spasticity.     Person(s) Educated Patient   Methods Explanation   Comprehension Verbalized understanding          PT Short Term Goals - 02/01/16 1239      PT SHORT TERM GOAL #1  Title =LTG's due to new medicaid approval for 8 visits through 03/10/16           PT Long Term Goals - 02/01/16 1240      PT LONG TERM GOAL #1   Title Pt will be independent with HEP in order to indicate improved functional mobility and decreased fall risk.  (Target date: 03/10/16-just got approval from Medicaid during visit on 02/01/16)   Baseline has 2 exercises, max verbal cues    Time 4   Period Weeks   Status New     PT LONG TERM GOAL #2   Title Pt will improve BERG balance test by 8 points from baseline in order to indicate decreased fall risk.    Baseline 46/56 on 02/01/16   Time 4   Period Weeks   Status On-going     PT LONG TERM GOAL #3   Title Pt will improve gait speed  to >2.62 ft/sec w/ LRAD in order to indicate pt is community ambulator.     Baseline 1.68 ft/sec on 12/07/15   Time 4   Period Weeks   Status On-going     PT LONG TERM GOAL #4   Title Pt will ambulate over varying outdoor surfaces (including grass, ramp, curb) x 500' w/ LRAD at mod I level in order to indicate safe return to community and leisure activities.     Baseline S over indoor surfaces with LBQC and R AFO   Time 4   Period Weeks   Status On-going     PT LONG TERM GOAL #5   Title Pt will perform floor transfer at mod I level in order to indicate safe fall recovery.     Baseline unable to assess due to time constraint   Time 4   Period Weeks   Status On-going     Additional Long Term Goals   Additional Long Term Goals Yes     PT LONG TERM GOAL #6   Title Pt will ambulate without AD, with brace up to 500' over indoor surfaces at mod I level in order to indicate improved independence with gait at home.    Baseline S to min/guard without brace on 02/01/16   Time 4   Period Weeks   Status New               Plan - 02/25/16 1640    Clinical Impression Statement Skilled session focused on stretching for RLE to decrease tone, gait without device for improved technique and NMR for RLE>    Rehab Potential Good   Clinical Impairments Affecting Rehab Potential Medicaid visit limitations   PT Frequency 2x / week   PT Duration 6 weeks   PT Treatment/Interventions ADLs/Self Care Home Management;Electrical Stimulation;DME Instruction;Gait training;Stair training;Functional mobility training;Therapeutic activities;Therapeutic exercise;Balance training;Neuromuscular re-education;Patient/family education;Orthotic Fit/Training;Vestibular;Energy conservation   PT Next Visit Plan work towards LTGs.    Consulted and Agree with Plan of Care Patient;Family member/caregiver   Family Member Consulted Daughter Brad Tran       Patient will benefit from skilled therapeutic intervention in  order to improve the following deficits and impairments:  Abnormal gait, Decreased balance, Decreased coordination, Decreased knowledge of precautions, Decreased knowledge of use of DME, Decreased mobility, Decreased safety awareness, Decreased strength, Impaired perceived functional ability, Improper body mechanics, Postural dysfunction  Visit Diagnosis: Hemiplegia and hemiparesis following cerebral infarction affecting right dominant side (HCC)  Other abnormalities of gait and mobility  Unsteadiness on feet  Other lack of coordination  Problem List Patient Active Problem List   Diagnosis Date Noted  . Right hemiparesis (HCC)   . Myofascial muscle pain   . 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 Prediabetes   . Stroke (HCC) 10/25/2015  . CVA (cerebral infarction) 10/25/2015  . Stroke (cerebrum) (HCC) 10/25/2015  . Essential hypertension 10/25/2015  . Hyperlipidemia 10/25/2015  . Acute ischemic stroke (HCC)   . Cerebral infarction due to unspecified mechanism   . Dysarthria     Harriet ButteEmily Jaquis Picklesimer, PT, MPT [facility] [address] [place], [place], [address] Phone: [phone]   Fax:  [phone] 02/25/16, 4:44 PM  Name: Brad BrooksChhoun Tran MRN: [id number] Date of Birth: 03/16/1966

## 2016-02-25 NOTE — Patient Instructions (Signed)
Hamstring Stretch, Seated (Strap, Two Chairs)    Sit with one leg extended onto facing chair or do with your right leg up on the bed. Loop strap over outstretched foot at ball of big toe. Lengthen spine. Hold for __60__ seconds. Repeat __2-3__ times on right leg.   Copyright  VHI. All rights reserved.    Passive Toe Bend    Sitting or lying down, cross one leg over other. Place one hand on heel and other on break of toes. Move toes down and up. When you pull the toes up, hold that position for at least 1 min.   Repeat _2-3___ times. Do __2-3__ sessions per day.  http://gt2.exer.us/401   Copyright  VHI. All rights reserved.

## 2016-02-26 ENCOUNTER — Ambulatory Visit (HOSPITAL_BASED_OUTPATIENT_CLINIC_OR_DEPARTMENT_OTHER): Payer: Medicaid Other | Admitting: Physical Medicine & Rehabilitation

## 2016-02-26 ENCOUNTER — Encounter: Payer: Medicaid Other | Attending: Physical Medicine & Rehabilitation

## 2016-02-26 ENCOUNTER — Encounter: Payer: Self-pay | Admitting: Physical Medicine & Rehabilitation

## 2016-02-26 VITALS — BP 135/84 | HR 62

## 2016-02-26 DIAGNOSIS — M7541 Impingement syndrome of right shoulder: Secondary | ICD-10-CM | POA: Diagnosis not present

## 2016-02-26 DIAGNOSIS — I10 Essential (primary) hypertension: Secondary | ICD-10-CM | POA: Insufficient documentation

## 2016-02-26 DIAGNOSIS — E785 Hyperlipidemia, unspecified: Secondary | ICD-10-CM | POA: Diagnosis not present

## 2016-02-26 DIAGNOSIS — I69354 Hemiplegia and hemiparesis following cerebral infarction affecting left non-dominant side: Secondary | ICD-10-CM | POA: Diagnosis present

## 2016-02-26 DIAGNOSIS — G8110 Spastic hemiplegia affecting unspecified side: Secondary | ICD-10-CM

## 2016-02-26 DIAGNOSIS — Z5189 Encounter for other specified aftercare: Secondary | ICD-10-CM | POA: Insufficient documentation

## 2016-02-26 MED ORDER — TIZANIDINE HCL 4 MG PO TABS: 2.0000 mg | ORAL_TABLET | Freq: Three times a day (TID) | ORAL | 1 refills | Status: DC | PRN

## 2016-02-26 NOTE — Patient Instructions (Signed)
OnabotulinumtoxinA injection (Medical Use) What is this medicine? ONABOTULINUMTOXINA (o na BOTT you lye num tox in eh) is a neuro-muscular blocker. This medicine is used to treat crossed eyes, eyelid spasms, severe neck muscle spasms, ankle and toe muscle spasms, and elbow, wrist, and finger muscle spasms. It is also used to treat excessive underarm sweating, to prevent chronic migraine headaches, and to treat loss of bladder control due to neurologic conditions such as multiple sclerosis or spinal cord injury. This medicine may be used for other purposes; ask your health care provider or pharmacist if you have questions. What should I tell my health care provider before I take this medicine? They need to know if you have any of these conditions: -breathing problems -cerebral palsy spasms -difficulty urinating -heart problems -history of surgery where this medicine is going to be used -infection at the site where this medicine is going to be used -myasthenia gravis or other neurologic disease -nerve or muscle disease -surgery plans -take medicines that treat or prevent blood clots -thyroid problems -an unusual or allergic reaction to botulinum toxin, albumin, other medicines, foods, dyes, or preservatives -pregnant or trying to get pregnant -breast-feeding How should I use this medicine? This medicine is for injection into a muscle. It is given by a health care professional in a hospital or clinic setting. Talk to your pediatrician regarding the use of this medicine in children. While this drug may be prescribed for children as young as 12 years old for selected conditions, precautions do apply. Overdosage: If you think you have taken too much of this medicine contact a poison control center or emergency room at once. NOTE: This medicine is only for you. Do not share this medicine with others. What if I miss a dose? This does not apply. What may interact with this  medicine? -aminoglycoside antibiotics like gentamicin, neomycin, tobramycin -muscle relaxants -other botulinum toxin injections This list may not describe all possible interactions. Give your health care provider a list of all the medicines, herbs, non-prescription drugs, or dietary supplements you use. Also tell them if you smoke, drink alcohol, or use illegal drugs. Some items may interact with your medicine. What should I watch for while using this medicine? Visit your doctor for regular check ups. This medicine will cause weakness in the muscle where it is injected. Tell your doctor if you feel unusually weak in other muscles. Get medical help right away if you have problems with breathing, swallowing, or talking. This medicine might make your eyelids droop or make you see blurry or double. If you have weak muscles or trouble seeing do not drive a car, use machinery, or do other dangerous activities. This medicine contains albumin from human blood. It may be possible to pass an infection in this medicine, but no cases have been reported. Talk to your doctor about the risks and benefits of this medicine. If your activities have been limited by your condition, go back to your regular routine slowly after treatment with this medicine. What side effects may I notice from receiving this medicine? Side effects that you should report to your doctor or health care professional as soon as possible: -allergic reactions like skin rash, itching or hives, swelling of the face, lips, or tongue -breathing problems -changes in vision -chest pain or tightness -eye irritation, pain -fast, irregular heartbeat -infection -numbness -speech problems -swallowing problems -unusual weakness Side effects that usually do not require medical attention (report to your doctor or health care professional if they continue   or are bothersome): -bruising or pain at site where injected -drooping eyelid -dry eyes or  mouth -headache -muscles aches, pains -sensitivity to light -tearing This list may not describe all possible side effects. Call your doctor for medical advice about side effects. You may report side effects to FDA at 1-800-FDA-1088. Where should I keep my medicine? This drug is given in a hospital or clinic and will not be stored at home. NOTE: This sheet is a summary. It may not cover all possible information. If you have questions about this medicine, talk to your doctor, pharmacist, or health care provider.    2016, Elsevier/Gold Standard. (2014-05-13 15:43:53)  

## 2016-02-26 NOTE — Progress Notes (Signed)
Subjective:    Patient ID: Brad Tran, male    DOB: 04/07/1966, 50 y.o.   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 General activity 8 Relation with others 6 Enjoyment of life 3 What TIME of day is your pain at its worst? morning Sleep (in general) Fair  Pain is worse with: some activites Pain improves with: therapy/exercise Relief from Meds: 0  Mobility walk without assistance ability to climb steps?  yes do you drive?  no  Function disabled: date disabled . I need assistance with the following:  meal prep  Neuro/Psych No problems in this area  Prior Studies Any changes since last visit?  no  Physicians involved in your care Any changes since last visit?  no   Family History  Problem Relation Age of Onset  . Heart attack Father   . Stroke Father    Social History   Social History  . Marital status: Divorced    Spouse name: N/A  . Number of children: N/A  . Years of education: N/A   Social History Main Topics  . Smoking status: Never Smoker  . Smokeless tobacco: Never Used  . Alcohol use Yes  . Drug use: No  . Sexual activity: Not Asked   Other Topics Concern  . None   Social History Narrative  . None   No past surgical history on file. Past Medical History:  Diagnosis Date  . Hyperlipidemia   . Hypertension   . Stroke (HCC)    BP 135/84 (BP Location: Left Arm, Patient Position: Sitting, Cuff Size: Normal)   Pulse 62   SpO2 98%   Opioid Risk Score:   Fall Risk Score:  `1  Depression screen PHQ 2/9  Dep[facility] screen [facility] 02/26/2016 12/22/2015 11/23/2015  Decreased Interest 0 0 0  Down, Depressed, Hopeless 0 0 0  PHQ - 2 Score 0 0 0  Altered sleeping - - 0  Tired, decreased  energy - - 1  Change in appetite - - 0  Feeling bad or failure about yourself  - - 0  Trouble concentrating - - 1  Moving slowly or fidgety/restless - - 1  Suicidal thoughts - - 0  PHQ-9 Score - - 3  Difficult doing work/chores - - Not difficult at all    Review of Systems  All other systems reviewed and are negative.      Objective:   Physical Exam  Constitutional: He appears well-developed and well-nourished.  HENT:  Head: Atraumatic.  Eyes: Conjunctivae and EOM are normal. Pupils are equal, round, and reactive to light.  Neck: Normal range of motion.  Neurological: He is alert.  Skin: Skin is warm.  Nursing note and vitals reviewed.  Right shoulder positive Hawkins Ashworth 3 at DIP, PIP digits 3 and 4, particularly Ashworth 2 at the wrist flexors Ashworth 2-3 at the elbow flexors       Assessment & Plan:  1. Left CVA with right spastic hemi-paresis, doing better from a functional standpoint, however, developing increasing spasticity. We'll trial tizanidine, continue PT, failing this, will schedule for Botox injection. Anticipate need to inject elbow, wrist and finger flexors  2. Right shoulder pain, impingement syndrome, right subacromial  injection today  Shoulder injection Right   Indication:R Shoulder pain not relieved by medication management and other conservative care.  Informed consent was obtained after describing risks and benefits of the procedure with the patient, this includes bleeding, bruising, infection and medication side effects. The patient wishes to proceed and has given written consent. Patient was placed in a seated position. TheR shoulder was marked and prepped with betadine in the subacromial area. A 25-gauge 1-1/2 inch needle was inserted into the subacromial area. After negative draw back for blood, a solution containing 1 mL of 6 mg per ML betamethasone and 4 mL of 1% lidocaine was injected. A band aid was applied. The patient tolerated the  procedure well. Post procedure instructions were given.

## 2016-02-28 ENCOUNTER — Telehealth: Payer: Self-pay | Admitting: Neurology

## 2016-02-28 NOTE — Telephone Encounter (Signed)
Please let the pt know that his TCD bubble study and emboli monitoring tests done the other day in our office were both negative. No change of management needed. Continue current treatment. Thanks.  Marvel PlanJindong Jru Pense, MD PhD Stroke Neurology 02/28/2016 11:35 PM

## 2016-02-29 ENCOUNTER — Ambulatory Visit: Payer: Medicaid Other | Admitting: Rehabilitation

## 2016-02-29 ENCOUNTER — Ambulatory Visit: Payer: Medicaid Other | Admitting: Occupational Therapy

## 2016-02-29 DIAGNOSIS — R278 Other lack of coordination: Secondary | ICD-10-CM

## 2016-02-29 DIAGNOSIS — R2689 Other abnormalities of gait and mobility: Secondary | ICD-10-CM

## 2016-02-29 DIAGNOSIS — I69351 Hemiplegia and hemiparesis following cerebral infarction affecting right dominant side: Secondary | ICD-10-CM

## 2016-02-29 NOTE — Therapy (Signed)
[facility] [address] [place], [place], [address] Phone: [phone]   Fax:  [phone]  Occupational Therapy Treatment  Patient Details  Name: Brad Tran MRN: [id number] Date of Birth: 05/17/1965 Referring Provider: Dr. Claudette LawsAndrew Kirsteins  Encounter Date: 02/29/2016      OT End of Session - 02/29/16 1317    Visit Number 8   Number of Visits 17   Date for OT Re-Evaluation 03/24/16   Authorization Type Medicaid approved 16 OT visits 01/29/16-03/24/16   Authorization - Visit Number 7   Authorization - Number of Visits 16   OT Start Time 1318   OT Stop Time 1400   OT Time Calculation (min) 42 min   Activity Tolerance Patient tolerated treatment well   Behavior During Therapy [facility] for tasks assessed/performed      Past Medical History:  Diagnosis Date  . Hyperlipidemia   . Hypertension   . Stroke [facility](HCC)     No past surgical history on file.  There were no vitals filed for this visit.    In supine, AAROM shoulder flex with PVC frame with min facilitation at  elbow for improved positioning.  Shoulder abduction AAROM with min-mod facilitation for positioning/normal movement patterns.  In sitting, wt. Bearing through R hand on mat with body on arm movements with min cueing for proper positioning/posture.  In sitting, AAROM shoulder flex with PVC frame with min-mod facilitation and cues for normal movement patterns.  ER and shoulder abduction with cane with mod facilitation/cues for isolated movement and avoiding compensation.  In sitting, with UEs place in slight ER/low range abduction, pt performed scapular retraction/depression with min facilitation and min cues.  In standing, low range shoulder flex with RUE with hemi-glide (diagonal surface) with min facilitation/cues, improved with repetition  In modified quadraped with hands on mat, wt. Bearing through BUEs with forward/backward wt. Shift for incr scapular  stability and decr tone with min cues/faciliation.  In sitting, flipping large cards for finger ext, shoulder ER, supination with mod cueing and facilitation for normal movement patterns.                              OT Short Term Goals - 02/29/16 1530      OT SHORT TERM GOAL #1   Title Pt will be independent with initial HEP.--check STGs modified to 02/25/16 [place]   Baseline reports only 1-2 exercises for RUE from home health   Time 4   Period Weeks   Status Achieved     OT SHORT TERM GOAL #2   Title Pt will verbalize understanding of edema management techniques and proper positioning of RUE to prevent pain/injury.   Baseline dependent, min-mod edema in R hand   Time 4   Period Weeks   Status On-going  02/25/16  needs min cueing, improves with positioning      OT SHORT TERM GOAL #3   Title Pt will be independent with splint wear/care prn for improved positioning.   Baseline no splint   Time 4   Period Weeks   Status Deferred  02/25/16 at this time as pt responds well to wt. bearing (may consider full composite extension splint prn)     OT SHORT TERM GOAL #4   Title Pt will demo at least 20* R shoulder flex in prep for functional reach.   Baseline no true active shoulder flex, approx 30*  AAROM   Time 4   Period Weeks   Status On-going     OT SHORT TERM GOAL #5   Title Pt will be demo at least 25% gross finger flex in prep for functional grasp.   Baseline trace   Time 4   Period Weeks   Status Achieved  02/01/16     OT SHORT TERM GOAL #6   Title Pt will use RUE as stabilizer for selected tasks.   Baseline unable to use RUE functionally   Time 4   Period Weeks   Status On-going  02/25/16            OT Long Term Goals - 02/18/16 1013      OT LONG TERM GOAL #1   Title Pt will be independent with updated HEP--check LTGs 03/24/16 (modified due to delay with medicaid approval)   Baseline dependent   Time 8    Period Weeks   Status New     OT LONG TERM GOAL #2   Title Pt will perform simple cooking task mod I.   Baseline only warming food in microwave currently (family prepares)   Time 8   Period Weeks   Status New     OT LONG TERM GOAL #3   Title Pt will demo at least 30* R shoulder flex in prep for functional reach.   Baseline no true active shoulder flex, approx 30* AAROM   Time 8   Period Weeks   Status New     OT LONG TERM GOAL #4   Title Pt will use RUE as gross assist for selected tasks.   Baseline unable to use RUE functionally.   Time 8   Period Weeks   Status New     OT LONG TERM GOAL #5   Title Pt will be able to perform light weightbearing through RUE without pain to assist in transfers/IADLs.   Baseline unable to use RUE functionally.   Time 8   Period Weeks   Status New               Plan - 02/29/16 1525    Clinical Impression Statement Pt continues to progress with improved ability to activate RUE with less synergy pattern with mod cueing and spasticity management techniques.  Pain improved after injection to R shoulder last week.  Pt reports that he is scheduled for Botox 03/07/16   Rehab Potential Good   OT Frequency 2x / week   OT Duration 8 weeks   OT Treatment/Interventions Self-care/ADL training;Fluidtherapy;DME and/or AE instruction;Moist Heat;Splinting;Patient/family education;Therapeutic exercises;Contrast Bath;Ultrasound;Therapeutic exercise;Therapeutic activities;Passive range of motion;Functional Mobility Training;Neuromuscular education;Cryotherapy;Electrical Stimulation;Parrafin;Manual Therapy;Energy conservation   Plan continue with neuro re-ed, attempt to use RUE as stabilizer, check shoulder AROM (STG)   OT Home Exercise Plan Education provided:  12/07/15 edema managment, initial HEP; 02/01/16  added to HEP   Consulted and Agree with Plan of Care Patient;Family member/caregiver   Family Member Consulted daughter      Patient will benefit  from skilled therapeutic intervention in order to improve the following deficits and impairments:  Decreased mobility, Decreased strength, Impaired UE functional use, Decreased knowledge of use of DME, Impaired tone, Increased edema, Decreased coordination, Decreased range of motion, Decreased balance  Visit Diagnosis: Hemiplegia and hemiparesis following cerebral infarction affecting right dominant side (HCC)  Other lack of coordination  Other abnormalities of gait and mobility    Problem List Patient Active Problem List   Diagnosis Date Noted  . Subacromial impingement  of right shoulder 02/26/2016  . Right hemiparesis (HCC)   . Myofascial muscle pain   . 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 Prediabetes   . Stroke (HCC) 10/25/2015  . CVA (cerebral infarction) 10/25/2015  . Stroke (cerebrum) (HCC) 10/25/2015  . Essential hypertension 10/25/2015  . Hyperlipidemia 10/25/2015  . Acute ischemic stroke (HCC)   . Cerebral infarction due to unspecified mechanism   . Dysarthria     [facility] 02/29/2016, 3:31 PM  [facility] [address] [place], [place], [address] Phone: [phone]   Fax:  [phone]  Name: Brad Tran MRN: [id number] Date of Birth: 02/09/1966   Willa Frater, OTR/L [facility] [address] [place], [place]  [address] [phone] phone [phone] 02/29/16 3:32 PM

## 2016-03-01 NOTE — Telephone Encounter (Signed)
LFt vm for patient to call back during office hours [phone] and [phone].

## 2016-03-02 NOTE — Telephone Encounter (Signed)
LFt vm for patient to call back about bubble study results. 

## 2016-03-03 ENCOUNTER — Ambulatory Visit: Payer: Medicaid Other | Admitting: Occupational Therapy

## 2016-03-03 ENCOUNTER — Ambulatory Visit: Payer: Medicaid Other | Admitting: Rehabilitation

## 2016-03-03 ENCOUNTER — Ambulatory Visit: Payer: Medicaid Other | Admitting: Rehabilitative and Restorative Service Providers"

## 2016-03-03 DIAGNOSIS — I69351 Hemiplegia and hemiparesis following cerebral infarction affecting right dominant side: Secondary | ICD-10-CM

## 2016-03-03 DIAGNOSIS — R2681 Unsteadiness on feet: Secondary | ICD-10-CM

## 2016-03-03 DIAGNOSIS — R2689 Other abnormalities of gait and mobility: Secondary | ICD-10-CM

## 2016-03-03 DIAGNOSIS — R278 Other lack of coordination: Secondary | ICD-10-CM

## 2016-03-03 NOTE — Telephone Encounter (Signed)
LFt vm for patient to call back about TCD bubble study results.

## 2016-03-03 NOTE — Patient Instructions (Signed)
Toe / Heel Raise (Sitting)    Sitting without brace on, raise heels, then rock back on heels and raise toes. Repeat __10__ times. *Watch ankle position and avoid the foot/toes turning in.  Copyright  VHI. All rights reserved.   Heel Cord Stretch    Place one leg forward, bent, other leg behind and straight. Lean forward keeping back heel flat. Hold __30__ seconds while counting out loud. Repeat with other leg. Repeat __3__ times. Do __2__ sessions per day.  http://gt2.exer.us/511   Copyright  VHI. All rights reserved.   *Can also try other home exercise of standing on right leg without shoes/brace on near countertop.  Pay attention to ankle position to ensure safe and not rolling.

## 2016-03-03 NOTE — Therapy (Addendum)
[facility] [address] [place], [place], [address] Phone: [phone]   Fax:  [phone]  Occupational Therapy Treatment  Patient Details  Name: Brad Tran MRN: [id number] Date of Birth: May 05, 1965 Referring Provider: Dr. [name]  Encounter Date: 03/03/2016      OT End of Session - 03/03/16 1008    Visit Number 9   Number of Visits 17   Date for OT Re-Evaluation 03/24/16   Authorization Type Medicaid approved 16 OT visits 01/29/16-03/24/16   Authorization - Visit Number 8   Authorization - Number of Visits 16   OT Start Time [phone]   OT Stop Time 1018   OT Time Calculation (min) 39 min   Activity Tolerance Patient tolerated treatment well   Behavior During Therapy [facility] for tasks assessed/performed      Past Medical History:  Diagnosis Date  . Hyperlipidemia   . Hypertension   . Stroke [facility])     No past surgical history on file.  There were no vitals filed for this visit.      Subjective Assessment - 03/03/16 1005    Subjective  "It's tight"   Patient is accompained by: Family member   Pertinent History small chronic infarct L thalamus, acute L basal ganglia nonhemorrhagic infarct   Limitations fall risk   Patient Stated Goals be able to use RUE   Currently in Pain? No/denies         In supine, AAROM shoulder flex with PVC frame with min facilitation at elbow for improved positioning.    In sitting, AAROM shoulder flex with PVC frame with min-mod facilitation and cues for normal movement patterns.    In sitting, with UEs place in slight ER/low range abduction, pt performed scapular retraction/depression with min facilitation and min cues.  In standing, low range shoulder flex with RUE with hemi-glide (diagonal surface) with min facilitation/cues, improved with repetition  Checked shoulder ROM--see goal section below.  Reviewed ways to incr RUE functional use at home:  Turning  doorknobs, use as stabilizer, hand-over-hand tasks.  Practiced using RUE as stabilizer for opening container, practiced using RUE as gross A for folding towel with min-mod difficulty, but no assit.  Pt verbalized understanding and reports trying to use RUE functionally at home.   NMES x35min for finger extension 50pps, 250 pulse width, 20sec cycle, 2 sec ramp with intensity=20 with no adverse reactions for neuro re-ed/spasticity management.    In standing, low range functional reach (forward and laterally) with min-mod cueing/facilitation for posture/avoiding flexor synergy pattern and for normal movement patterns to grasp/release of small cylinder pegs.  Pt instructed/cued for intentional relaxation of shoulder/elbow.  Pt improved with repetition         OT Education - 03/03/16 1006    Education Details Scapular depression/retraction with hands positioned in abduction on mat (for home); Importance of avoiding MP hyperext when stretching hand to avoid joint damage   Person(s) Educated Patient;Spouse   Methods Explanation;Demonstration;Verbal cues   Comprehension Verbalized understanding;Returned demonstration          OT Short Term Goals - 03/03/16 1054      OT SHORT TERM GOAL #1   Title Pt will be independent with initial HEP.--check STGs modified to 02/25/16 due to delay in [place] approval   Baseline reports only 1-2 exercises for RUE from home health   Time 4   Period Weeks   Status Achieved     OT SHORT TERM GOAL #2  Title Pt will verbalize understanding of edema management techniques and proper positioning of RUE to prevent pain/injury.   Baseline dependent, min-mod edema in R hand   Time 4   Period Weeks   Status On-going  02/25/16  needs min cueing, improves with positioning      OT SHORT TERM GOAL #3   Title Pt will be independent with splint wear/care prn for improved positioning.   Baseline no splint   Time 4   Period Weeks   Status Deferred  02/25/16 at this  time as pt responds well to wt. bearing (may consider full composite extension splint prn)     OT SHORT TERM GOAL #4   Title Pt will demo at least 20* R shoulder flex in prep for functional reach.   Baseline no true active shoulder flex, approx 30* AAROM   Time 4   Period Weeks   Status Achieved  03/03/16:  20-25* with min compensation     OT SHORT TERM GOAL #5   Title Pt will be demo at least 25% gross finger flex in prep for functional grasp.   Baseline trace   Time 4   Period Weeks   Status Achieved  02/01/16     OT SHORT TERM GOAL #6   Title Pt will use RUE as stabilizer for selected tasks.   Baseline unable to use RUE functionally   Time 4   Period Weeks   Status Achieved  03/03/16:  stabilizer-gross A           OT Long Term Goals - 03/03/16 1055      OT LONG TERM GOAL #1   Title Pt will be independent with updated HEP--check LTGs 03/24/16 (modified due to delay with medicaid approval)   Baseline dependent   Time 8   Period Weeks   Status New     OT LONG TERM GOAL #2   Title Pt will perform simple cooking task mod I.   Baseline only warming food in microwave currently (family prepares)   Time 8   Period Weeks   Status New     OT LONG TERM GOAL #3   Title Pt will demo at least 30* R shoulder flex in prep for functional reach.   Baseline no true active shoulder flex, approx 30* AAROM   Time 8   Period Weeks   Status New     OT LONG TERM GOAL #4   Title Pt will use RUE as gross assist for selected tasks.   Baseline unable to use RUE functionally.   Time 8   Period Weeks   Status On-going     OT LONG TERM GOAL #5   Title Pt will be able to perform light weightbearing through RUE without pain to assist in transfers/IADLs.   Baseline unable to use RUE functionally.   Time 8   Period Weeks   Status Achieved  03/03/16               Plan - 03/03/16 1009    Clinical Impression Statement Pt continues to progress with less proximal compensation  with RUE functional use, but spasticity in hand affects functional grasp/release.  Pt does report incr RUE functional use at home.   Rehab Potential Good   OT Frequency 2x / week   OT Duration 8 weeks   OT Treatment/Interventions Self-care/ADL training;Fluidtherapy;DME and/or AE instruction;Moist Heat;Splinting;Patient/family education;Therapeutic exercises;Contrast Bath;Ultrasound;Therapeutic exercise;Therapeutic activities;Passive range of motion;Functional Mobility Training;Neuromuscular education;Cryotherapy;Electrical Stimulation;Parrafin;Manual Therapy;Energy conservation   Plan  continue with neuro re-ed   OT Home Exercise Plan Education provided:  12/07/15 edema managment, initial HEP; 02/01/16  added to HEP   Consulted and Agree with Plan of Care Patient;Family member/caregiver   Family Member Consulted daughter      Patient will benefit from skilled therapeutic intervention in order to improve the following deficits and impairments:  Decreased mobility, Decreased strength, Impaired UE functional use, Decreased knowledge of use of DME, Impaired tone, Increased edema, Decreased coordination, Decreased range of motion, Decreased balance  Visit Diagnosis: Hemiplegia and hemiparesis following cerebral infarction affecting right dominant side (HCC)  Other lack of coordination    Problem List Patient Active Problem List   Diagnosis Date Noted  . Subacromial impingement of right shoulder 02/26/2016  . Right hemiparesis (HCC)   . Myofascial muscle pain   . 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 Prediabetes   . Stroke (HCC) 10/25/2015  . CVA (cerebral infarction) 10/25/2015  . Stroke (cerebrum) (HCC) 10/25/2015  . Essential hypertension 10/25/2015  . Hyperlipidemia 10/25/2015  . Acute ischemic stroke (HCC)   . Cerebral infarction due to unspecified mechanism   . Dysarthria     [facility] Services[facility]REEMAN,Sheridyn Canino 03/03/2016, 2:23 PM  [facility] [address] [place], [place], [address] Phone: [phone]   Fax:  [phone]  Name: Donita BrooksChhoun Akers MRN: [id number] Date of Birth: 10/27/1965   Willa FraterAngela Raegan Sipp, OTR/L [facility] [address] [place], [place]  [address] [phone] phone [phone] 03/03/16 2:23 PM

## 2016-03-03 NOTE — Therapy (Signed)
[facility] [address] [place], [place], [address] Phone: [phone]   Fax:  [phone]  Physical Therapy Treatment  Patient Details  Name: Brad Tran MRN: [id number] Date of Birth: 05/21/1965 Referring Provider: Alysia Penna, MD  Encounter Date: 03/03/2016      PT End of Session - 03/03/16 1149    Visit Number 8   Number of Visits 9  updated due to medicaid approval on 02/01/16   Date for PT Re-Evaluation 03/10/16  updated due to medicaid approval 02/01/16   Authorization Type Medicaid approved 8 visits from 01/29/16-03/10/16   Authorization - Visit Number 6   Authorization - Number of Visits 8   PT Start Time 1497   PT Stop Time 1150   PT Time Calculation (min) 45 min   Equipment Utilized During Treatment Gait belt   Activity Tolerance Patient tolerated treatment well   Behavior During Therapy [facility] for tasks assessed/performed      Past Medical History:  Diagnosis Date  . Hyperlipidemia   . Hypertension   . Stroke [facility])     No past surgical history on file.  There were no vitals filed for this visit.      Subjective Assessment - 03/03/16 1107    Subjective Patient notes things are going "slowly" with recovery.   "The leg is fine, I move around okay, but the hand is slower."   Patient is accompained by: Family member   Pertinent History Pronouned "Choo-un" , daughter April    Currently in Pain? No/denies    NEUROMUSCULAR RE-EDUCATION: Berg (only performed components that were impaired last assessment)=51/56.       [place] Adult PT Treatment/Exercise - 03/03/16 0001      Standardized Balance Assessment   Standardized Balance Assessment Berg Balance Test     Berg Balance Test   Sit to Stand Able to stand without using hands and stabilize independently   Standing Unsupported Able to stand safely 2 minutes   Sitting with Back Unsupported but Feet Supported on Floor or Stool Able to sit  safely and securely 2 minutes   Stand to Sit Sits safely with minimal use of hands   Transfers Able to transfer safely, minor use of hands   Standing Unsupported with Eyes Closed Able to stand 10 seconds safely   Standing Ubsupported with Feet Together Able to place feet together independently and stand 1 minute safely   From Standing, Reach Forward with Outstretched Arm Can reach confidently >25 cm (10")   From Standing Position, Pick up Object from Floor Able to pick up shoe safely and easily   From Standing Position, Turn to Look Behind Over each Shoulder Looks behind from both sides and weight shifts well   Turn 360 Degrees Able to turn 360 degrees safely but slowly   Standing Unsupported, Alternately Place Feet on Step/Stool Able to complete 4 steps without aid or supervision   Standing Unsupported, One Foot in Front Able to plae foot ahead of the other independently and hold 30 seconds   Standing on One Leg Able to lift leg independently and hold > 10 seconds  on left leg   Total Score 51     Gait: Ambulation into clinic x 100 feet with slowed timing and dec'd stance on R side Treadmill working on mid stance control for pre-gait activities with L leg off treadmill belt and R LE pushing into treadmill and moving belt posteriorly x 5 reps Gait x 3 minutes on  treadmill from 1.0-1.1 mph with UE support Gait with metronome off of treadmill working on pacing and consistent timing between R/L sides Backwards walking with dec'd R knee flexion  THERAPEUTIC EXERCISE: Standing knee flexion with UE support Standing single leg stance without AFO donned with UE support Standing heel raises bilaterally without AFO donned, then attempted single leg on R side with tactile facilitation Seated ankle DF/ toe flexion and extension/ plantar flexion and attempted eversion Standing heel cord stretch  THERAPEUTIC ACTIVITIES: Floor<>stand transfer with intermittent UE support moving from 1/2 knee to  standing.         PT Education - 03/03/16 1141    Education provided Yes   Education Details HEP: ankle heel/toe raises, heel cord stretch, single leg stance near support without AFO.   Person(s) Educated Patient   Methods Explanation;Demonstration;Handout   Comprehension Returned demonstration;Verbalized understanding          PT Short Term Goals - 02/01/16 1239      PT SHORT TERM GOAL #1   Title =LTG's due to new medicaid approval for 8 visits through 03/10/16           PT Long Term Goals - 03/03/16 1126      PT LONG TERM GOAL #1   Title Pt will be independent with HEP in order to indicate improved functional mobility and decreased fall risk.  (Target date: 03/10/16-just got approval from Medicaid during visit on 02/01/16)   Baseline has 2 exercises, max verbal cues    Time 4   Period Weeks   Status On-going     PT LONG TERM GOAL #2   Title Pt will improve BERG balance test by 8 points from baseline in order to indicate decreased fall risk.    Baseline 46/56 on 02/01/16 and improved to 51/56 on 03/03/2016   Time 4   Period Weeks   Status Partially Met     PT LONG TERM GOAL #3   Title Pt will improve gait speed to >2.62 ft/sec w/ LRAD in order to indicate pt is community ambulator.     Baseline 1.68 ft/sec on 12/07/15   Time 4   Period Weeks   Status On-going     PT LONG TERM GOAL #4   Title Pt will ambulate over varying outdoor surfaces (including grass, ramp, curb) x 500' w/ LRAD at mod I level in order to indicate safe return to community and leisure activities.     Baseline S over indoor surfaces with LBQC and R AFO   Time 4   Period Weeks   Status On-going     PT LONG TERM GOAL #5   Title Pt will perform floor transfer at mod I level in order to indicate safe fall recovery.     Baseline Met on 03/03/2016 with intermittent help with L UE on chair.   Time 4   Period Weeks   Status Achieved     PT LONG TERM GOAL #6   Title Pt will ambulate without  AD, with brace up to 500' over indoor surfaces at mod I level in order to indicate improved independence with gait at home.    Baseline S to min/guard without brace on 02/01/16   Time 4   Period Weeks   Status On-going               Plan - 03/03/16 1202    Clinical Impression Statement The patient met LTG for floor transfers and partially met LTG for  Berg (improving 6 points).  PT also provided exercises for ankle strengthening within patient's ability.  PT to check reamaining LTGs and d/c next session.   Clinical Impairments Affecting Rehab Potential Medicaid visit limitations   PT Treatment/Interventions ADLs/Self Care Home Management;Electrical Stimulation;DME Instruction;Gait training;Stair training;Functional mobility training;Therapeutic activities;Therapeutic exercise;Balance training;Neuromuscular re-education;Patient/family education;Orthotic Fit/Training;Vestibular;Energy conservation   PT Next Visit Plan Check LTGs.  Review HEP if indicated.   Consulted and Agree with Plan of Care Patient      Patient will benefit from skilled therapeutic intervention in order to improve the following deficits and impairments:  Abnormal gait, Decreased balance, Decreased coordination, Decreased knowledge of precautions, Decreased knowledge of use of DME, Decreased mobility, Decreased safety awareness, Decreased strength, Impaired perceived functional ability, Improper body mechanics, Postural dysfunction  Visit Diagnosis: Other abnormalities of gait and mobility  Unsteadiness on feet  Hemiplegia and hemiparesis following cerebral infarction affecting right dominant side [facility])     Problem List Patient Active Problem List   Diagnosis Date Noted  . Subacromial impingement of right shoulder 02/26/2016  . Right hemiparesis ([place])   . Myofascial muscle pain   . Cerebral infarction due to thrombosis of left middle cerebral artery ([place])   . Prediabetes   . Stroke ([place]) 10/25/2015  . CVA  (cerebral infarction) 10/25/2015  . Stroke (cerebrum) ([place]) 10/25/2015  . Essential hypertension 10/25/2015  . Hyperlipidemia 10/25/2015  . Acute ischemic stroke ([place])   . Cerebral infarction due to unspecified mechanism   . Dysarthria     Brad Tran, PT 03/03/2016, 12:49 PM  [facility] [address] [place], [place], [address] Phone: [phone]   Fax:  [phone]  Name: Paiton Fosco MRN: [id number] Date of Birth: 08-29-1965

## 2016-03-07 ENCOUNTER — Ambulatory Visit: Payer: Medicaid Other | Admitting: Occupational Therapy

## 2016-03-07 ENCOUNTER — Ambulatory Visit (HOSPITAL_BASED_OUTPATIENT_CLINIC_OR_DEPARTMENT_OTHER): Payer: Medicaid Other | Admitting: Physical Medicine & Rehabilitation

## 2016-03-07 ENCOUNTER — Encounter: Payer: Self-pay | Admitting: Physical Medicine & Rehabilitation

## 2016-03-07 VITALS — BP 138/91 | HR 67 | Resp 14

## 2016-03-07 DIAGNOSIS — R278 Other lack of coordination: Secondary | ICD-10-CM

## 2016-03-07 DIAGNOSIS — G8110 Spastic hemiplegia affecting unspecified side: Secondary | ICD-10-CM | POA: Diagnosis not present

## 2016-03-07 DIAGNOSIS — I69351 Hemiplegia and hemiparesis following cerebral infarction affecting right dominant side: Secondary | ICD-10-CM

## 2016-03-07 DIAGNOSIS — I69354 Hemiplegia and hemiparesis following cerebral infarction affecting left non-dominant side: Secondary | ICD-10-CM | POA: Diagnosis not present

## 2016-03-07 NOTE — Progress Notes (Signed)
Botox Injection for spasticity using needle EMG guidance  Patient right-hand-dominant  Dilution: 50 Units/ml Indication: Severe spasticity which interferes with ADL,mobility and/or  hygiene and is unresponsive to medication management and other conservative care Informed consent was obtained after describing risks and benefits of the procedure with the patient. This includes bleeding, bruising, infection, excessive weakness, or medication side effects. A REMS form is on file and signed. Needle: 27g 1" needle electrode Number of units per muscle  Biceps100 Brachiorad 25 FCR25  FDS25 FDP25   All injections were done after obtaining appropriate EMG activity and after negative drawback for blood. The patient tolerated the procedure well. Post procedure instructions were given. A followup appointment was made.

## 2016-03-07 NOTE — Therapy (Addendum)
[facility] [address] [place], [place], [address] Phone: [phone]   Fax:  [phone]  Occupational Therapy Treatment  Patient Details  Name: Brad Tran MRN: [id number] Date of Birth: 17-Oct-1965 Referring Provider: Dr. [name]  Encounter Date: 03/07/2016      OT End of Session - 03/07/16 1051    Visit Number 10   Number of Visits 17   Date for OT Re-Evaluation 03/24/16   Authorization Type Medicaid approved 16 OT visits 01/29/16-03/24/16   Authorization - Visit Number 9   Authorization - Number of Visits 16   OT Start Time 1100   OT Stop Time 1145   OT Time Calculation (min) 45 min   Activity Tolerance Patient tolerated treatment well   Behavior During Therapy [facility] for tasks assessed/performed      Past Medical History:  Diagnosis Date  . Hyperlipidemia   . Hypertension   . Stroke [facility])     No past surgical history on file.  There were no vitals filed for this visit.      Subjective Assessment - 03/07/16 1051    Subjective  "doing good"   Patient is accompained by: Family member   Pertinent History small chronic infarct L thalamus, acute L basal ganglia nonhemorrhagic infarct   Limitations fall risk   Patient Stated Goals be able to use RUE   Currently in Pain? No/denies        In supine, AAROM shoulder flex with PVC frame with min facilitation at elbow for improved positioning.    In sitting, AAROM shoulder flex with PVC frame with min-mod facilitation and cues for normal movement patterns.    In sitting, with UEs place in slight ER/low range abduction, pt performed scapular retraction/depression with min facilitation and min cues.  In standing, low range shoulder flex with RUE with hemi-glide (diagonal surface) with min facilitation/cues, improved with repetition  In sitting, cane ex for shoulder abduction (low-range) and ER with min cueing/facilitation.  In sitting, chest  press with scapular retraction with PVC frame with min cues/faciliation.   In sitting, flipping large cards with mod facilitation for finger ext.  (focus on finger ext and supination).  Attempted scapular retraction/shoulder ext with cane in standing, but d/c due to pain.    NMES x74min for finger extension 50pps, 250 pulse width, 20sec cycle, 2 sec ramp with intensity=20 with no adverse reactions for neuro re-ed/spasticity management.                            OT Education - 03/07/16 1615    Education Details Updated HEP--see pt instructions   Person(s) Educated Patient;Child(ren)   Methods Explanation;Demonstration;Handout;Verbal cues   Comprehension Verbalized understanding;Returned demonstration;Verbal cues required          OT Short Term Goals - 03/03/16 1054      OT SHORT TERM GOAL #1   Title Pt will be independent with initial HEP.--check STGs modified to 02/25/16 due to delay in [place] approval   Baseline reports only 1-2 exercises for RUE from home health   Time 4   Period Weeks   Status Achieved     OT SHORT TERM GOAL #2   Title Pt will verbalize understanding of edema management techniques and proper positioning of RUE to prevent pain/injury.   Baseline dependent, min-mod edema in R hand   Time 4   Period Weeks   Status On-going  02/25/16  needs  min cueing, improves with positioning      OT SHORT TERM GOAL #3   Title Pt will be independent with splint wear/care prn for improved positioning.   Baseline no splint   Time 4   Period Weeks   Status Deferred  02/25/16 at this time as pt responds well to wt. bearing (may consider full composite extension splint prn)     OT SHORT TERM GOAL #4   Title Pt will demo at least 20* R shoulder flex in prep for functional reach.   Baseline no true active shoulder flex, approx 30* AAROM   Time 4   Period Weeks   Status Achieved  03/03/16:  20-25* with min compensation     OT SHORT TERM GOAL #5    Title Pt will be demo at least 25% gross finger flex in prep for functional grasp.   Baseline trace   Time 4   Period Weeks   Status Achieved  02/01/16     OT SHORT TERM GOAL #6   Title Pt will use RUE as stabilizer for selected tasks.   Baseline unable to use RUE functionally   Time 4   Period Weeks   Status Achieved  03/03/16:  stabilizer-gross A           OT Long Term Goals - 03/03/16 1055      OT LONG TERM GOAL #1   Title Pt will be independent with updated HEP--check LTGs 03/24/16 (modified due to delay with medicaid approval)   Baseline dependent   Time 8   Period Weeks   Status New     OT LONG TERM GOAL #2   Title Pt will perform simple cooking task mod I.   Baseline only warming food in microwave currently (family prepares)   Time 8   Period Weeks   Status New     OT LONG TERM GOAL #3   Title Pt will demo at least 30* R shoulder flex in prep for functional reach.   Baseline no true active shoulder flex, approx 30* AAROM   Time 8   Period Weeks   Status New     OT LONG TERM GOAL #4   Title Pt will use RUE as gross assist for selected tasks.   Baseline unable to use RUE functionally.   Time 8   Period Weeks   Status On-going     OT LONG TERM GOAL #5   Title Pt will be able to perform light weightbearing through RUE without pain to assist in transfers/IADLs.   Baseline unable to use RUE functionally.   Time 8   Period Weeks   Status Achieved  03/03/16               Plan - 03/07/16 1051    Clinical Impression Statement Pt continues to slowly progress with less proximal compensations, but spasticity in hand continues to limit RUE functional use.  Pt scheduled for Botox today.   Rehab Potential Good   OT Frequency 2x / week   OT Duration 8 weeks   OT Treatment/Interventions Self-care/ADL training;Fluidtherapy;DME and/or AE instruction;Moist Heat;Splinting;Patient/family education;Therapeutic exercises;Contrast Bath;Ultrasound;Therapeutic  exercise;Therapeutic activities;Passive range of motion;Functional Mobility Training;Neuromuscular education;Cryotherapy;Electrical Stimulation;Parrafin;Manual Therapy;Energy conservation   Plan neuro re-ed, RUE functional use   OT Home Exercise Plan Education provided:  12/07/15 edema managment, initial HEP; 02/01/16  added to HEP; 03/07/16 added to HEP   Consulted and Agree with Plan of Care Patient;Family member/caregiver   Family Member Consulted daughter  Patient will benefit from skilled therapeutic intervention in order to improve the following deficits and impairments:  Decreased mobility, Decreased strength, Impaired UE functional use, Decreased knowledge of use of DME, Impaired tone, Increased edema, Decreased coordination, Decreased range of motion, Decreased balance  Visit Diagnosis: Hemiplegia and hemiparesis following cerebral infarction affecting right dominant side ([facility])  Other lack of coordination    Problem List Patient Active Problem List   Diagnosis Date Noted  . Subacromial impingement of right shoulder 02/26/2016  . Right hemiparesis ([facility])   . Myofascial muscle pain   . 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 Prediabetes   . Stroke ([facility]) 10/25/2015  . CVA (cerebral infarction) 10/25/2015  . Stroke (cerebrum) ([facility]) 10/25/2015  . Essential hypertension 10/25/2015  . Hyperlipidemia 10/25/2015  . Acute ischemic stroke ([facility])   . Cerebral infarction due to unspecified mechanism   . Dysarthria     [facility] HospitalFREEMAN,ANGELA 03/07/2016, 4:16 PM  [facility] [address] [place], [place], [address] Phone: [phone]   Fax:  [phone]  Name: Brad Tran MRN: [id number] Date of Birth: 03/24/1966   Willa FraterAngela Freeman, OTR/L [facility] [address] [place], [place]  [id number] [phone] phone [phone] 03/07/16 4:16 PM

## 2016-03-07 NOTE — Patient Instructions (Signed)

## 2016-03-07 NOTE — Patient Instructions (Signed)
   Lie on back holding frame with thumb up. Raise arms to shoulder height.  KEEP SHOULDER DOWN AND ELBOW STRAIGHT Hold 5sec. Repeat 15 times per set.  Do 1-2 sessions per day.  REPEAT SITTING UP ONLY GOING AS HIGH AS YOU CAN WITH ELBOW STRAIGHT AND SHOULDER DOWN.   ROM: Abduction - Wand   Holding wand with left hand palm up, push wand directly out to side, leading with other hand palm down, until stretch is felt EVEN IF IT IS A SMALL MOVEMENT.  Hold 5 seconds. Repeat 10 times per set. Do 1-2 sessions per day.    ROM: Extension - Wand (Standing)   Stand holding wand behind back. Raise arms as far as possible. Repeat 10 times per set.  Do 2-3 sessions per day.    Active Assistive Shoulder External Rotation    SIT with stick at waist level, RIGHT  palm up, LEFT palm down. PLACE TOWEL BETWEEN ELBOW AND BODY, push forearm out from body and keep elbows bent AND TO YOUR SIDE. Hold 5. Perform 15 reps. 1-2X DAY      IN STANDING, TRY TO GRASP SMALL BOTTLES THEN REACH OUT TO THE SIDE TO RELEASE WITH SHOULDER DOWN AND ELBOW STRAIGHT.  IN SITTING, FLIP PLAYING CARDS, KEEP SHOULDER DOWN AND ELBOW BY YOUR SIDE.  TURN PALM UP.  IN SITTING, PLACE HANDS OUT TO THE SIDE WITH FINGERS POINTING OUT.  SQUEEZE SHOULDER BLADES TOGETHER AND DOWN, LIFT CHEST/HEAD.

## 2016-03-08 ENCOUNTER — Ambulatory Visit: Payer: Medicaid Other | Admitting: Occupational Therapy

## 2016-03-08 DIAGNOSIS — R278 Other lack of coordination: Secondary | ICD-10-CM

## 2016-03-08 DIAGNOSIS — I69351 Hemiplegia and hemiparesis following cerebral infarction affecting right dominant side: Secondary | ICD-10-CM

## 2016-03-08 NOTE — Therapy (Signed)
[facility] [address] [place], [place], [address] Phone: [phone]   Fax:  [phone]  Occupational Therapy Treatment  Patient Details  Name: Brad BrooksChhoun Tomei MRN: [id number] Date of Birth: 02/01/1966 Referring Provider: Dr. Claudette LawsAndrew Kirsteins  Encounter Date: 03/08/2016      OT End of Session - 03/08/16 1313    Visit Number 11   Number of Visits 17   Date for OT Re-Evaluation 03/24/16   Authorization Type Medicaid approved 16 OT visits 01/29/16-03/24/16   Authorization - Visit Number 10   Authorization - Number of Visits 16   OT Start Time 1105   OT Stop Time 1145   OT Time Calculation (min) 40 min   Activity Tolerance Patient tolerated treatment well   Behavior During Therapy [facility] for tasks assessed/performed      Past Medical History:  Diagnosis Date  . Hyperlipidemia   . Hypertension   . Stroke [facility](HCC)     No past surgical history on file.  There were no vitals filed for this visit.      Subjective Assessment - 03/08/16 1303    Subjective  Pt reports mild soreness due to Botox injections yesterday but tolerated therapy well   Pertinent History small chronic infarct L thalamus, acute L basal ganglia nonhemorrhagic infarct   Limitations fall risk   Patient Stated Goals be able to use RUE   Currently in Pain? No/denies       In supine, AAROM shoulder flex with PVC frame with min facilitation at elbow for improved positioning.    In sidelying, AAROM shoulder flex table slides with min facilitation at scapula for incr mobility.  Followed by scapular depression and retraction in this position with mod cues/min facilitation.  In sitting, AAROM shoulder flex with PVC frame with min-mod facilitation and cues for normal movement patterns.    In sitting, low range chest press with foam noodle with scapular retraction with min cues/faciliation.   NMES x8110min for finger extension 50pps, 250 pulse width, 5sec  cycle, 2 sec ramp with intensity=14-16 with no adverse reactions for neuro re-ed/spasticity management WHILE standing, and performing low range functional reach (forward and laterally) with min-mod cueing/facilitation for posture/avoiding flexor synergy pattern and for normal movement patterns to grasp/release of small cylinder pegs.  Pt instructed/cued for intentional relaxation of shoulder/elbow.  Pt improved with repetition   In sitting, low range functional reaching to grasp/release small balls with min cueing for relaxation of shoulder/elbow to avoid synergy pattern.                         OT Short Term Goals - 03/03/16 1054      OT SHORT TERM GOAL #1   Title Pt will be independent with initial HEP.--check STGs modified to 02/25/16 [place]   Baseline reports only 1-2 exercises for RUE from home health   Time 4   Period Weeks   Status Achieved     OT SHORT TERM GOAL #2   Title Pt will verbalize understanding of edema management techniques and proper positioning of RUE to prevent pain/injury.   Baseline dependent, min-mod edema in R hand   Time 4   Period Weeks   Status On-going  02/25/16  needs min cueing, improves with positioning      OT SHORT TERM GOAL #3   Title Pt will be independent with splint wear/care prn for improved positioning.   Baseline no  splint   Time 4   Period Weeks   Status Deferred  02/25/16 at this time as pt responds well to wt. bearing (may consider full composite extension splint prn)     OT SHORT TERM GOAL #4   Title Pt will demo at least 20* R shoulder flex in prep for functional reach.   Baseline no true active shoulder flex, approx 30* AAROM   Time 4   Period Weeks   Status Achieved  03/03/16:  20-25* with min compensation     OT SHORT TERM GOAL #5   Title Pt will be demo at least 25% gross finger flex in prep for functional grasp.   Baseline trace   Time 4   Period Weeks   Status Achieved   02/01/16     OT SHORT TERM GOAL #6   Title Pt will use RUE as stabilizer for selected tasks.   Baseline unable to use RUE functionally   Time 4   Period Weeks   Status Achieved  03/03/16:  stabilizer-gross A           OT Long Term Goals - 03/03/16 1055      OT LONG TERM GOAL #1   Title Pt will be independent with updated HEP--check LTGs 03/24/16 (modified due to delay with medicaid approval)   Baseline dependent   Time 8   Period Weeks   Status New     OT LONG TERM GOAL #2   Title Pt will perform simple cooking task mod I.   Baseline only warming food in microwave currently (family prepares)   Time 8   Period Weeks   Status New     OT LONG TERM GOAL #3   Title Pt will demo at least 30* R shoulder flex in prep for functional reach.   Baseline no true active shoulder flex, approx 30* AAROM   Time 8   Period Weeks   Status New     OT LONG TERM GOAL #4   Title Pt will use RUE as gross assist for selected tasks.   Baseline unable to use RUE functionally.   Time 8   Period Weeks   Status On-going     OT LONG TERM GOAL #5   Title Pt will be able to perform light weightbearing through RUE without pain to assist in transfers/IADLs.   Baseline unable to use RUE functionally.   Time 8   Period Weeks   Status Achieved  03/03/16               Plan - 03/08/16 1724    Clinical Impression Statement Pt continues to progress with decr proximal compensations.     Rehab Potential Good   OT Frequency 2x / week   OT Duration 8 weeks   OT Treatment/Interventions Self-care/ADL training;Fluidtherapy;DME and/or AE instruction;Moist Heat;Splinting;Patient/family education;Therapeutic exercises;Contrast Bath;Ultrasound;Therapeutic exercise;Therapeutic activities;Passive range of motion;Functional Mobility Training;Neuromuscular education;Cryotherapy;Electrical Stimulation;Parrafin;Manual Therapy;Energy conservation   Plan neuro re-ed, RUE functional use   OT Home Exercise  Plan Education provided:  12/07/15 edema managment, initial HEP; 02/01/16  added to HEP; 03/07/16 added to HEP   Consulted and Agree with Plan of Care Patient;Family member/caregiver   Family Member Consulted daughter      Patient will benefit from skilled therapeutic intervention in order to improve the following deficits and impairments:  Decreased mobility, Decreased strength, Impaired UE functional use, Decreased knowledge of use of DME, Impaired tone, Increased edema, Decreased coordination, Decreased range of motion, Decreased balance  Visit Diagnosis: Hemiplegia and hemiparesis following cerebral infarction affecting right dominant side (HCC)  Other lack of coordination    Problem List Patient Active Problem List   Diagnosis Date Noted  . Subacromial impingement of right shoulder 02/26/2016  . Right hemiparesis (HCC)   . Myofascial muscle pain   . 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 Prediabetes   . Stroke (HCC) 10/25/2015  . CVA (cerebral infarction) 10/25/2015  . Stroke (cerebrum) (HCC) 10/25/2015  . Essential hypertension 10/25/2015  . Hyperlipidemia 10/25/2015  . Acute ischemic stroke (HCC)   . Cerebral infarction due to unspecified mechanism   . Dysarthria     [facility] 03/08/2016, 5:26 PM  [facility] [address] [place], [place], [address] Phone: [phone]   Fax:  [phone]  Name: Susie Pousson MRN: [id number] Date of Birth: February 24, 1966   Willa Frater, OTR/L [facility] [address] [place], [place]  [address] [phone] phone [phone] 03/08/16 5:26 PM

## 2016-03-14 ENCOUNTER — Ambulatory Visit: Payer: Medicaid Other | Admitting: Occupational Therapy

## 2016-03-14 DIAGNOSIS — I69351 Hemiplegia and hemiparesis following cerebral infarction affecting right dominant side: Secondary | ICD-10-CM | POA: Diagnosis not present

## 2016-03-14 DIAGNOSIS — R278 Other lack of coordination: Secondary | ICD-10-CM

## 2016-03-14 NOTE — Therapy (Signed)
[facility] [address] [place], [place], [address] Phone: [phone]   Fax:  [phone]  Occupational Therapy Treatment  Patient Details  Name: Brad Tran MRN: [id number] Date of Birth: Aug 26, 1965 Referring Provider: Dr. [name]  Encounter Date: 03/14/2016      OT End of Session - 03/14/16 1140    Visit Number 12   Number of Visits 17   Date for OT Re-Evaluation 03/24/16   Authorization Type Medicaid approved 16 OT visits 01/29/16-03/24/16   Authorization - Visit Number 11   Authorization - Number of Visits 16   OT Start Time 1105   OT Stop Time 1145   OT Time Calculation (min) 40 min   Activity Tolerance Patient tolerated treatment well   Behavior During Therapy [facility] for tasks assessed/performed      Past Medical History:  Diagnosis Date  . Hyperlipidemia   . Hypertension   . Stroke [facility])     No past surgical history on file.  There were no vitals filed for this visit.      Subjective Assessment - 03/14/16 1309    Subjective  Pt reports that his fingers seem to open a little better   Patient is accompained by: Family member   Pertinent History small chronic infarct L thalamus, acute L basal ganglia nonhemorrhagic infarct   Limitations fall risk   Patient Stated Goals be able to use RUE   Currently in Pain? Yes   Pain Score --  did not rate   Pain Location Arm  at elbow   Pain Orientation Right   Pain Descriptors / Indicators Sharp   Pain Type Acute pain   Pain Onset Today   Pain Frequency Intermittent   Aggravating Factors  elbow flexion   Pain Relieving Factors improved with stretching, elbow ext, proper positioning       In supine, AAROM shoulder flex with PVC frame with min facilitation at elbow for improved positioning.  Attempted chest press, but discontinued due to elbow pain.  In standing, AAROM shoulder flex with UE ranger on diagonal surface for mid-high range with mod  facilitation/min cues for normal movement patterns.   In standing, AAROM shoulder flex stretch with ball on horizontal surface with min cueing.  In standing, AAROM shoulder flex with elbow ext with hemi-glide with min cueing for avoiding compensations.  Wt. Bearing in modified quadraped with cat/cow positions and forward/backward wt. Shifts for incr scapular stability and mobility and decr tone.  In sitting, AAROM shoulder flex with PVC frame with min and cues for normal movement patterns..  In sitting, low range chest press with foam noodle with scapular retraction with min cues/faciliation.   In standing, low range functional reach (forward and laterally) with min-mod cueing/facilitation for posture/avoiding flexor synergy pattern and for normal movement patterns to grasp/release of 1-inch block using lateral pinch.  Pt instructed/cued for intentional relaxation of shoulder/elbow.  Pt improved with repetition.  In sitting, sliding on table in forward flex and abduction as able to grasp/release small cylinder objects with min-mod cues for elbow ext and to use tabletop as support/avoid compensation to help with normal movement patterns.                           OT Short Term Goals - 03/03/16 1054      OT SHORT TERM GOAL #1   Title Pt will be independent with initial HEP.--check STGs modified to 02/25/16 [place]  [place]   Baseline reports only 1-2 exercises for RUE from home health   Time 4   Period Weeks   Status Achieved     OT SHORT TERM GOAL #2   Title Pt will verbalize understanding of edema management techniques and proper positioning of RUE to prevent pain/injury.   Baseline dependent, min-mod edema in R hand   Time 4   Period Weeks   Status On-going  02/25/16  needs min cueing, improves with positioning      OT SHORT TERM GOAL #3   Title Pt will be independent with splint wear/care prn for improved positioning.   Baseline no splint    Time 4   Period Weeks   Status Deferred  02/25/16 at this time as pt responds well to wt. bearing (may consider full composite extension splint prn)     OT SHORT TERM GOAL #4   Title Pt will demo at least 20* R shoulder flex in prep for functional reach.   Baseline no true active shoulder flex, approx 30* AAROM   Time 4   Period Weeks   Status Achieved  03/03/16:  20-25* with min compensation     OT SHORT TERM GOAL #5   Title Pt will be demo at least 25% gross finger flex in prep for functional grasp.   Baseline trace   Time 4   Period Weeks   Status Achieved  02/01/16     OT SHORT TERM GOAL #6   Title Pt will use RUE as stabilizer for selected tasks.   Baseline unable to use RUE functionally   Time 4   Period Weeks   Status Achieved  03/03/16:  stabilizer-gross A           OT Long Term Goals - 03/03/16 1055      OT LONG TERM GOAL #1   Title Pt will be independent with updated HEP--check LTGs 03/24/16 (modified due to delay with medicaid approval)   Baseline dependent   Time 8   Period Weeks   Status New     OT LONG TERM GOAL #2   Title Pt will perform simple cooking task mod I.   Baseline only warming food in microwave currently (family prepares)   Time 8   Period Weeks   Status New     OT LONG TERM GOAL #3   Title Pt will demo at least 30* R shoulder flex in prep for functional reach.   Baseline no true active shoulder flex, approx 30* AAROM   Time 8   Period Weeks   Status New     OT LONG TERM GOAL #4   Title Pt will use RUE as gross assist for selected tasks.   Baseline unable to use RUE functionally.   Time 8   Period Weeks   Status On-going     OT LONG TERM GOAL #5   Title Pt will be able to perform light weightbearing through RUE without pain to assist in transfers/IADLs.   Baseline unable to use RUE functionally.   Time 8   Period Weeks   Status Achieved  03/03/16               Plan - 03/14/16 1311    Clinical Impression  Statement Pt continues to make slow progress with RUE functional use.   Rehab Potential Good   OT Frequency 2x / week   OT Duration 8 weeks   OT Treatment/Interventions Self-care/ADL training;Fluidtherapy;DME  and/or AE instruction;Moist Heat;Splinting;Patient/family education;Therapeutic exercises;Contrast Bath;Ultrasound;Therapeutic exercise;Therapeutic activities;Passive range of motion;Functional Mobility Training;Neuromuscular education;Cryotherapy;Electrical Stimulation;Parrafin;Manual Therapy;Energy conservation   Plan neuro re-ed, RUE functional use   OT Home Exercise Plan Education provided:  12/07/15 edema managment, initial HEP; 02/01/16  added to HEP; 03/07/16 added to HEP   Consulted and Agree with Plan of Care Patient;Family member/caregiver   Family Member Consulted daughter      Patient will benefit from skilled therapeutic intervention in order to improve the following deficits and impairments:  Decreased mobility, Decreased strength, Impaired UE functional use, Decreased knowledge of use of DME, Impaired tone, Increased edema, Decreased coordination, Decreased range of motion, Decreased balance  Visit Diagnosis: Hemiplegia and hemiparesis following cerebral infarction affecting right dominant side (HCC)  Other lack of coordination    Problem List Patient Active Problem List   Diagnosis Date Noted  . Subacromial impingement of right shoulder 02/26/2016  . Right hemiparesis (HCC)   . Myofascial muscle pain   . 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 Prediabetes   . Stroke (HCC) 10/25/2015  . CVA (cerebral infarction) 10/25/2015  . Stroke (cerebrum) (HCC) 10/25/2015  . Essential hypertension 10/25/2015  . Hyperlipidemia 10/25/2015  . Acute ischemic stroke (HCC)   . Cerebral infarction due to unspecified mechanism   . Dysarthria     [facility],Rumi Kolodziej 03/14/2016, 1:12 PM  [facility] [address] [place], [place], [address] Phone: [phone]   Fax:  [phone]  Name: Brad Tran MRN: [id number] Date of Birth: 11/12/1965   Willa FraterAngela Brittny Spangle, OTR/L [facility] [address] [place], [place]  [address] [phone] phone [phone] 03/14/16 1:35 PM

## 2016-03-17 ENCOUNTER — Ambulatory Visit (INDEPENDENT_AMBULATORY_CARE_PROVIDER_SITE_OTHER): Payer: Medicaid Other | Admitting: Family Medicine

## 2016-03-17 ENCOUNTER — Encounter: Payer: Self-pay | Admitting: Family Medicine

## 2016-03-17 ENCOUNTER — Ambulatory Visit: Payer: Medicaid Other | Admitting: Occupational Therapy

## 2016-03-17 VITALS — BP 138/85 | HR 60 | Temp 97.9°F | Resp 16 | Ht 64.0 in | Wt 166.0 lb

## 2016-03-17 DIAGNOSIS — I69351 Hemiplegia and hemiparesis following cerebral infarction affecting right dominant side: Secondary | ICD-10-CM

## 2016-03-17 DIAGNOSIS — Z23 Encounter for immunization: Secondary | ICD-10-CM | POA: Diagnosis not present

## 2016-03-17 DIAGNOSIS — Z1211 Encounter for screening for malignant neoplasm of colon: Secondary | ICD-10-CM | POA: Diagnosis not present

## 2016-03-17 DIAGNOSIS — R278 Other lack of coordination: Secondary | ICD-10-CM

## 2016-03-17 DIAGNOSIS — I10 Essential (primary) hypertension: Secondary | ICD-10-CM

## 2016-03-17 DIAGNOSIS — E7800 Pure hypercholesterolemia, unspecified: Secondary | ICD-10-CM | POA: Diagnosis not present

## 2016-03-17 MED ORDER — METOPROLOL TARTRATE 50 MG PO TABS: 50.0000 mg | ORAL_TABLET | Freq: Two times a day (BID) | ORAL | 3 refills | Status: DC

## 2016-03-17 MED ORDER — ATORVASTATIN CALCIUM 80 MG PO TABS: 80.0000 mg | ORAL_TABLET | Freq: Every day | ORAL | 3 refills | Status: DC

## 2016-03-17 MED ORDER — ASPIRIN EC 325 MG PO TBEC: 325.0000 mg | DELAYED_RELEASE_TABLET | Freq: Every day | ORAL | 0 refills | Status: DC

## 2016-03-17 MED FILL — ATORVASTATIN 80 MG TABLET: 80 | 30 days supply | Qty: 30 | Fill #0

## 2016-03-17 MED FILL — METOPROLOL TARTRATE 50 MG T: 50 | 30 days supply | Qty: 60 | Fill #0

## 2016-03-17 NOTE — Patient Instructions (Signed)
1.  Place small bottles on table.  Sit at table with towel under arm.  Keep elbow down and slide arm out to grasp bottle then slide out to the side to release.  2.  Stand, try to slowly raise arm only as far as you can without hiking shoulder, keep elbow straight, and thumb up.  If you cannot maintain this stop, rest, and try again later.  3.  Grasp empty plastic cup and try to bring to your mouth, don't hike shoulder.  Keep elbow tucked in next to your body.

## 2016-03-17 NOTE — Therapy (Signed)
[facility] [address] [place], [place], [address] Phone: [phone]   Fax:  [phone]  Occupational Therapy Treatment  Patient Details  Name: Brad Tran MRN: [id number] Date of Birth: [birth year] Referring Provider: Dr. Alysia Penna  Encounter Date: 03/17/2016      OT End of Session - 03/17/16 1112    Visit Number 13   Number of Visits 17   Date for OT Re-Evaluation 03/24/16   Authorization Type Medicaid approved 16 OT visits 01/29/16-03/24/16  (03/17/16, requested extension through 12/15)   Authorization - Visit Number 12   Authorization - Number of Visits 16   OT Start Time 1106   OT Stop Time 1149   OT Time Calculation (min) 43 min   Activity Tolerance Patient tolerated treatment well   Behavior During Therapy [facility] for tasks assessed/performed      Past Medical History:  Diagnosis Date  . Hyperlipidemia   . Hypertension   . Stroke [facility])     No past surgical history on file.  There were no vitals filed for this visit.      Subjective Assessment - 03/17/16 1111    Subjective  Pt reports that his shoulder/upper arm feels better   Pertinent History small chronic infarct L thalamus, acute L basal ganglia nonhemorrhagic infarct   Limitations fall risk   Patient Stated Goals be able to use RUE   Currently in Pain? No/denies       In supine, AAROM shoulder flex with PVC frame with min cueing intermittently at elbow for improved positioning.    In standing, AROM shoulder flex in low range (to 40*) with facilitation/min cues for normal movement patterns, progressed to performing without facilitation.   Wt. Bearing in modified quadraped with cat/cow positions and forward/backward wt. Shifts for incr scapular stability and mobility and decr tone with min cueing.  Wt. Bearing through hand in abduction in sitting with body on arm movements with min cues.  In sitting, AAROM shoulder flex with PVC  frame with min and cues for normal movement patterns to mid-range.  In sitting, low range chest press with foam noodle with scapular retraction with min cues/faciliation.   In prone with RUE off mat, scapular retraction with shoulder ext with mod facilitation for RUE then with abduction with mod facilitation for RUE.    In sitting, sliding on table in forward flex and abduction as able to grasp/release small cylinder objects with min cues for elbow ext and to use tabletop as support/avoid compensation to help with normal movement patterns.   In sitting, grasping cup and bringing to mouth and then releasing in prep to drink with min facilitation/mod cues for normal movement patterns.                      OT Education - 03/17/16 1211    Education Details Updates to HEP--see pt instructions   Person(s) Educated Patient;Child(ren)   Methods Explanation;Demonstration;Verbal cues;Handout   Comprehension Verbalized understanding;Returned demonstration;Verbal cues required          OT Short Term Goals - 03/17/16 1335      OT SHORT TERM GOAL #1   Title Pt will be independent with initial HEP.--check STGs modified to 02/25/16 due to delay in [place] approval   Baseline reports only 1-2 exercises for RUE from home health   Time 4   Period Weeks   Status Achieved     OT SHORT TERM GOAL #2   Title  Pt will verbalize understanding of edema management techniques and proper positioning of RUE to prevent pain/injury.   Baseline dependent, min-mod edema in R hand   Time 4   Period Weeks   Status Achieved  02/25/16  needs min cueing, improves with positioning.  03/17/16     OT SHORT TERM GOAL #3   Title Pt will be independent with splint wear/care prn for improved positioning.   Baseline no splint   Time 4   Period Weeks   Status Deferred  02/25/16 at this time as pt responds well to wt. bearing (may consider full composite extension splint prn)     OT SHORT TERM GOAL #4    Title Pt will demo at least 20* R shoulder flex in prep for functional reach.   Baseline no true active shoulder flex, approx 30* AAROM   Time 4   Period Weeks   Status Achieved  03/03/16:  20-25* with min compensation     OT SHORT TERM GOAL #5   Title Pt will be demo at least 25% gross finger flex in prep for functional grasp.   Baseline trace   Time 4   Period Weeks   Status Achieved  02/01/16     OT SHORT TERM GOAL #6   Title Pt will use RUE as stabilizer for selected tasks.   Baseline unable to use RUE functionally   Time 4   Period Weeks   Status Achieved  03/03/16:  stabilizer-gross A           OT Long Term Goals - 03/17/16 1336      OT LONG TERM GOAL #1   Title Pt will be independent with updated HEP--check LTGs 03/24/16 (modified due to delay with medicaid approval)   Baseline dependent   Time 8   Period Weeks   Status On-going     OT LONG TERM GOAL #2   Title Pt will perform simple cooking task mod I.   Baseline only warming food in microwave currently (family prepares)   Time 8   Period Weeks   Status On-going  03/17/16:  Pt reports warming items up on stovetop     OT LONG TERM GOAL #3   Title Pt will demo at least 30* R shoulder flex in prep for functional reach.  Updated 03/17/16:  Pt will demo at least 45* R shoulder flex for functional reach.   Baseline no true active shoulder flex, approx 30* AAROM   Time 8   Period Weeks   Status Revised  03/17/16 met initial goal 40* and updated goal     OT LONG TERM GOAL #4   Title Pt will use RUE as gross assist for selected tasks.   Baseline unable to use RUE functionally.   Time 8   Period Weeks   Status Achieved  03/17/16     OT LONG TERM GOAL #5   Title Pt will be able to perform light weightbearing through RUE without pain to assist in transfers/IADLs.   Baseline unable to use RUE functionally.   Time 8   Period Weeks   Status Achieved  03/03/16     Long Term Additional Goals   Additional  Long Term Goals Yes     OT LONG TERM GOAL #6   Title Added  03/17/16:  Pt will be able to grasp/release small cylinder object in 4/5 trials.   Status New  Plan - 03/17/16 1334    Clinical Impression Statement Pt continues to make progress with improved shoulder flexion in low range with normal movement patterns (40*)   Rehab Potential Good   OT Frequency 2x / week   OT Duration 8 weeks   OT Treatment/Interventions Self-care/ADL training;Fluidtherapy;DME and/or AE instruction;Moist Heat;Splinting;Patient/family education;Therapeutic exercises;Contrast Bath;Ultrasound;Therapeutic exercise;Therapeutic activities;Passive range of motion;Functional Mobility Training;Neuromuscular education;Cryotherapy;Electrical Stimulation;Parrafin;Manual Therapy;Energy conservation   Plan neuro re-ed, RUE functional use   OT Home Exercise Plan Education provided:  12/07/15 edema managment, initial HEP; 02/01/16  added to HEP; 03/07/16 added to HEP   Consulted and Agree with Plan of Care Patient;Family member/caregiver   Family Member Consulted daughter      Patient will benefit from skilled therapeutic intervention in order to improve the following deficits and impairments:  Decreased mobility, Decreased strength, Impaired UE functional use, Decreased knowledge of use of DME, Impaired tone, Increased edema, Decreased coordination, Decreased range of motion, Decreased balance  Visit Diagnosis: Hemiplegia and hemiparesis following cerebral infarction affecting right dominant side (HCC)  Other lack of coordination    Problem List Patient Active Problem List   Diagnosis Date Noted  . Subacromial impingement of right shoulder 02/26/2016  . Right hemiparesis ([place])   . Myofascial muscle pain   . Cerebral infarction due to thrombosis of left middle cerebral artery ([place])   . Prediabetes   . Stroke ([place]) 10/25/2015  . CVA (cerebral infarction) 10/25/2015  . Stroke (cerebrum) ([place])  10/25/2015  . Essential hypertension 10/25/2015  . Hyperlipidemia 10/25/2015  . Acute ischemic stroke ([place])   . Cerebral infarction due to unspecified mechanism   . Dysarthria     [facility] 03/17/2016, 1:42 PM  [facility] [address] [place], [place], [address] Phone: [phone]   Fax:  [phone]  Name: Brad Tran MRN: [id number] Date of Birth: 02/19/1966   Vianne Bulls, OTR/L [facility] [address] [place], [place]  [phone] phone [phone] 03/17/16 1:42 PM

## 2016-03-17 NOTE — Patient Instructions (Signed)
Continue current treatment. 

## 2016-03-17 NOTE — Progress Notes (Signed)
Brad BrooksChhoun Lenz, is a 50 y.o. male  ZOX:[id number]:[id number]  WJX:[id number]  DOB - 04/16/1966  CC:  Chief Complaint  Patient presents with  . Hypertension  . Hyperlipidemia       HPI: Brad Tran is a 50 y.o. male here for follow-up hypertension, hyperlipidemia, and prediabetes. 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 He is current receiving PT. He reports otherwise doing well.   Health Maintenance: To receive Tdap and influenza today. He also due colon cancer screening   No Known Allergies Past Medical History:  Diagnosis Date  . Hyperlipidemia   . Hypertension   . Stroke [facility](HCC)    Current Outpatient Prescriptions on File Prior to Visit  Medication Sig Dispense Refill  . Menthol-Methyl Salicylate (MUSCLE RUB) 10-15 % CREA Apply 1 application topically 2 (two) times daily as needed (to right shoulder for pain/muscle soreness). 113 g 0  . tiZANidine (ZANAFLEX) 4 MG tablet Take 0.5 tablets (2 mg total) by mouth every 8 (eight) hours as needed for muscle spasms. 45 tablet 1   No current facility-administered medications on file prior to visit.    Family History  Problem Relation Age of Onset  . Heart attack Father   . Stroke Father    Social History   Social History  . Marital status: Divorced    Spouse name: N/A  . Number of children: N/A  . Years of education: N/A   Occupational History  . Not on file.   Social History Main Topics  . Smoking status: Never Smoker  . Smokeless tobacco: Never Used  . Alcohol use Yes  . Drug use: No  . Sexual activity: Not on file   Other Topics Concern  . Not on file   Social History Narrative  . No narrative on file    Review of Systems: Constitutional: Negative Skin: Negative HENT: Negative  Eyes: Negative  Neck: Negative Respiratory: Negative Cardiovascular: Negative Gastrointestinal: Negative Genitourinary: Negative  Musculoskeletal: + weakness and spasticity of right side. Pain in  right shoulder.  Neurological: Negative for Hematological: Negative  Psychiatric/Behavioral: Negative    Objective:   Vitals:   03/17/16 1327  BP: 138/85  Pulse: 60  Resp: 16  Temp: 97.9 F (36.6 C)    Physical Exam: Constitutional: Patient appears well-developed and well-nourished. No distress. HENT: Normocephalic, atraumatic, External right and left ear normal. Oropharynx is clear and moist.  Eyes: Conjunctivae and EOM are normal. PERRLA, no scleral icterus. Neck: Normal ROM. Neck supple. No lymphadenopathy, No thyromegaly. CVS: RRR, S1/S2 +, no murmurs, no gallops, no rubs Pulmonary: Effort and breath sounds normal, no stridor, rhonchi, wheezes, rales.  Abdominal: Soft. Normoactive BS,, no distension, tenderness, rebound or guarding.  Musculoskeletal: Normal range of motion. No edema and no tenderness. Right sided weakness present Neuro: Alert.Normal muscle tone coordination. Non-focal Skin: Skin is warm and dry. No rash noted. Not diaphoretic. No erythema. No pallor. Psychiatric: Normal mood and affect. Behavior, judgment, thought content normal.  Lab Results  Component Value Date   WBC 5.8 10/28/2015   HGB 17.0 10/28/2015   HCT 49.2 10/28/2015   MCV 85.0 10/28/2015   PLT 188 10/28/2015   Lab Results  Component Value Date   CREATININE 0.90 11/10/2015   BUN 19 10/28/2015   NA 139 10/28/2015   K 4.8 10/28/2015   CL 105 10/28/2015   CO2 27 10/28/2015    Lab Results  Component Value Date   HGBA1C 5.7 (H)  10/25/2015   Lipid Panel     Component Value Date/Time   CHOL 269 (H) 10/25/2015 0747   TRIG 269 (H) 10/25/2015 0747   HDL 39 (L) 10/25/2015 0747   CHOLHDL 6.9 10/25/2015 0747   VLDL 54 (H) 10/25/2015 0747   LDLCALC 176 (H) 10/25/2015 0747        Assessment and plan:   1. Need for prophylactic vaccination and inoculation against influenza  - Flu Vaccine QUAD 36+ mos PF IM (Fluarix & Fluzone Quad PF)  2. Hypercholesteremia  - atorvastatin  (LIPITOR) 80 MG tablet; Take 1 tablet (80 mg total) by mouth daily at 6 PM.  Dispense: 34 tablet; Refill: 3 - aspirin EC 325 MG tablet; Take 1 tablet (325 mg total) by mouth daily.  Dispense: 100 tablet; Refill: 0  3. Essential hypertension  - metoprolol (LOPRESSOR) 50 MG tablet; Take 1 tablet (50 mg total) by mouth 2 (two) times daily.  Dispense: 68 tablet; Refill: 3  4. Need for Tdap vaccination  - Tdap vaccine greater than or equal to 7yo IM  5. Special screening for malignant neoplasms, colon  - Ambulatory referral to Gastroenterology   No Follow-up on file.  The patient was given clear instructions to go to ER or return to medical center if symptoms don't improve, worsen or new problems develop. The patient verbalized understanding.    Henrietta HooverLinda C Shawnte Demarest FNP  03/17/2016, 2:22 PM

## 2016-03-21 ENCOUNTER — Ambulatory Visit: Payer: Medicaid Other | Attending: Physical Medicine & Rehabilitation | Admitting: Occupational Therapy

## 2016-03-21 ENCOUNTER — Ambulatory Visit: Payer: Self-pay | Admitting: Physical Medicine & Rehabilitation

## 2016-03-21 DIAGNOSIS — R278 Other lack of coordination: Secondary | ICD-10-CM | POA: Insufficient documentation

## 2016-03-21 DIAGNOSIS — I69351 Hemiplegia and hemiparesis following cerebral infarction affecting right dominant side: Secondary | ICD-10-CM | POA: Diagnosis not present

## 2016-03-21 MED FILL — tiZANidine HCL 4 MG TABS: 4 | 30 days supply | Qty: 45 | Fill #0

## 2016-03-21 NOTE — Therapy (Signed)
[facility] [address] [place], [place], [address] Phone: [phone]   Fax:  [phone]  Occupational Therapy Treatment  Patient Details  Name: Brad Tran MRN: [id number] Date of Birth: [birth year] Referring Provider: Dr. Alysia Penna  Encounter Date: 03/21/2016      OT End of Session - 03/21/16 1157    Visit Number 14   Number of Visits 17   Date for OT Re-Evaluation 03/24/16   Authorization Type Medicaid approved 16 OT visits 01/29/16-03/24/16  (03/17/16, requested extension through 12/15)   Authorization - Visit Number 13   Authorization - Number of Visits 16   OT Start Time 1740   OT Stop Time 1233   OT Time Calculation (min) 40 min   Activity Tolerance Patient tolerated treatment well   Behavior During Therapy [facility] for tasks assessed/performed      Past Medical History:  Diagnosis Date  . Hyperlipidemia   . Hypertension   . Stroke [facility])     No past surgical history on file.  There were no vitals filed for this visit.      Subjective Assessment - 03/21/16 1158    Subjective  "Its hard"  "I did better at home"   Patient is accompained by: Family member   Pertinent History small chronic infarct L thalamus, acute L basal ganglia nonhemorrhagic infarct   Limitations fall risk   Patient Stated Goals be able to use RUE   Currently in Pain? No/denies        In supine, AAROM shoulder flex with PVC frame with min cueing intermittently at elbow for improved positioning.    In standing, AROM shoulder flex in low range with facilitation/min cues for normal movement patterns, progressed to performing without facilitation.   In standing, AAROM shoulder flex in low range with hemi-glide with facilitation/min cues for normal movement patterns  Wt. Bearing in modified quadraped with cat/cow positions and forward/backward wt. Shifts for incr scapular stability and mobility and decr tone with min  cueing.  In sitting, AAROM shoulder flex with PVC frame with min and cues for normal movement patterns to mid-range.  In sitting, low range chest press with foam noodle with scapular retraction with min cues/faciliation.   Attempted arm bike for reciprocal movement, but discontinued due to compensation patterns   In sitting, sliding on table in forward flex and abduction as able to grasp/release small cylinder objects with min cues for elbow ext and to use tabletop as support/avoid compensation to help with normal movement patterns.   In sitting, grasping cup and bringing to mouth and then releasing in prep to drink with min facilitation/mod cues for normal movement patterns.   In standing, low range functional reach (forward and laterally) with min cueing/facilitation for posture/avoiding flexor synergy pattern and for normal movement patterns to grasp/release of 1-inch block using lateral pinch.  Pt instructed/cued for intentional relaxation of shoulder/elbow.  Pt improved with repetition.                           OT Short Term Goals - 03/17/16 1335      OT SHORT TERM GOAL #1   Title Pt will be independent with initial HEP.--check STGs modified to 02/25/16 due to delay in [place] approval   Baseline reports only 1-2 exercises for RUE from home health   Time 4   Period Weeks   Status Achieved     OT SHORT TERM GOAL #2  Title Pt will verbalize understanding of edema management techniques and proper positioning of RUE to prevent pain/injury.   Baseline dependent, min-mod edema in R hand   Time 4   Period Weeks   Status Achieved  02/25/16  needs min cueing, improves with positioning.  03/17/16     OT SHORT TERM GOAL #3   Title Pt will be independent with splint wear/care prn for improved positioning.   Baseline no splint   Time 4   Period Weeks   Status Deferred  02/25/16 at this time as pt responds well to wt. bearing (may consider full composite extension  splint prn)     OT SHORT TERM GOAL #4   Title Pt will demo at least 20* R shoulder flex in prep for functional reach.   Baseline no true active shoulder flex, approx 30* AAROM   Time 4   Period Weeks   Status Achieved  03/03/16:  20-25* with min compensation     OT SHORT TERM GOAL #5   Title Pt will be demo at least 25% gross finger flex in prep for functional grasp.   Baseline trace   Time 4   Period Weeks   Status Achieved  02/01/16     OT SHORT TERM GOAL #6   Title Pt will use RUE as stabilizer for selected tasks.   Baseline unable to use RUE functionally   Time 4   Period Weeks   Status Achieved  03/03/16:  stabilizer-gross A           OT Long Term Goals - 03/21/16 1309      OT LONG TERM GOAL #1   Title Pt will be independent with updated HEP--check LTGs 03/24/16 (modified due to delay with medicaid approval)   Baseline dependent   Time 8   Period Weeks   Status Achieved  03/21/16     OT LONG TERM GOAL #2   Title Pt will perform simple cooking task mod I.   Baseline only warming food in microwave currently (family prepares)   Time 8   Period Weeks   Status On-going  03/17/16:  Pt reports warming items up on stovetop     OT LONG TERM GOAL #3   Title Pt will demo at least 30* R shoulder flex in prep for functional reach.  Updated 03/17/16:  Pt will demo at least 45* R shoulder flex for functional reach.   Baseline no true active shoulder flex, approx 30* AAROM   Time 8   Period Weeks   Status Revised  03/17/16 met initial goal 40* and updated goal     OT LONG TERM GOAL #4   Title Pt will use RUE as gross assist for selected tasks.   Baseline unable to use RUE functionally.   Time 8   Period Weeks   Status Achieved  03/17/16     OT LONG TERM GOAL #5   Title Pt will be able to perform light weightbearing through RUE without pain to assist in transfers/IADLs.   Baseline unable to use RUE functionally.   Time 8   Period Weeks   Status Achieved   03/03/16     OT LONG TERM GOAL #6   Title Added  03/17/16:  Pt will be able to grasp/release small cylinder object in 4/5 trials.   Status New               Plan - 03/21/16 1252    Clinical Impression Statement Pt demo  improved ablity to grasp and release small objects today.   Rehab Potential Good   OT Frequency 2x / week   OT Duration 8 weeks   OT Treatment/Interventions Self-care/ADL training;Fluidtherapy;DME and/or AE instruction;Moist Heat;Splinting;Patient/family education;Therapeutic exercises;Contrast Bath;Ultrasound;Therapeutic exercise;Therapeutic activities;Passive range of motion;Functional Mobility Training;Neuromuscular education;Cryotherapy;Electrical Stimulation;Parrafin;Manual Therapy;Energy conservation   Plan neuro re-ed, RUE functional use, begin checking goals, check if date extension approved   OT Home Exercise Plan Education provided:  12/07/15 edema managment, initial HEP; 02/01/16  added to HEP; 03/07/16 added to HEP   Consulted and Agree with Plan of Care Patient;Family member/caregiver   Family Member Consulted daughter      Patient will benefit from skilled therapeutic intervention in order to improve the following deficits and impairments:  Decreased mobility, Decreased strength, Impaired UE functional use, Decreased knowledge of use of DME, Impaired tone, Increased edema, Decreased coordination, Decreased range of motion, Decreased balance  Visit Diagnosis: Hemiplegia and hemiparesis following cerebral infarction affecting right dominant side (HCC)  Other lack of coordination    Problem List Patient Active Problem List   Diagnosis Date Noted  . Subacromial impingement of right shoulder 02/26/2016  . Right hemiparesis ([place])   . Myofascial muscle pain   . Cerebral infarction due to thrombosis of left middle cerebral artery ([place])   . Prediabetes   . Stroke ([place]) 10/25/2015  . CVA (cerebral infarction) 10/25/2015  . Stroke (cerebrum) ([place])  10/25/2015  . Essential hypertension 10/25/2015  . Hyperlipidemia 10/25/2015  . Acute ischemic stroke ([place])   . Cerebral infarction due to unspecified mechanism   . Dysarthria     [facility] 03/21/2016, 1:10 PM  [facility] [address] [place], [place], [address] Phone: [phone]   Fax:  [phone]  Name: Brad Tran MRN: [id number] Date of Birth: 11/27/1965   Vianne Bulls, OTR/L [facility] [address] [place], [place]  [address] [phone] phone [phone] 03/21/16 1:10 PM

## 2016-03-23 ENCOUNTER — Ambulatory Visit: Payer: Medicaid Other | Admitting: Occupational Therapy

## 2016-03-23 DIAGNOSIS — I69351 Hemiplegia and hemiparesis following cerebral infarction affecting right dominant side: Secondary | ICD-10-CM

## 2016-03-23 DIAGNOSIS — R278 Other lack of coordination: Secondary | ICD-10-CM

## 2016-03-23 NOTE — Patient Instructions (Signed)
AROM: Wrist Extension   .  With right palm down, bend wrist up. Repeat __10_ times per set.  Do __2__ sessions per day.      AROM: Forearm Pronation / Supination   With  arm in handshake position, slowly rotate palm up until stretch is felt. Repeat _10___ times per set. Do _2___ sessions per day.  Copyright  VHI. All rights reserved.

## 2016-03-23 NOTE — Therapy (Signed)
[place] [address] [place], [place], [address] Phone: [phone]   Fax:  [phone]  Occupational Therapy Treatment  Patient Details  Name: Brad Tran MRN: [id number] Date of Birth: [birth year] Referring Provider: Dr. Alysia Penna  Encounter Date: 03/23/2016      OT End of Session - 03/23/16 1306    Visit Number 15   Number of Visits 17   Date for OT Re-Evaluation 03/24/16   Authorization Type Medicaid approved 16 OT visits 01/29/16-03/24/16  (03/17/16, requested extension through 12/15)   Authorization - Visit Number 14   Authorization - Number of Visits 16   OT Start Time 1150   OT Stop Time 1232   OT Time Calculation (min) 42 min   Activity Tolerance Patient tolerated treatment well   Behavior During Therapy [facility] for tasks assessed/performed      Past Medical History:  Diagnosis Date  . Hyperlipidemia   . Hypertension   . Stroke [facility])     No past surgical history on file.  There were no vitals filed for this visit.    In supine, AAROM shoulder flex with PVC frame with min cueing intermittently at elbow for improved positioning.    In standing, AAROM shoulder flex in low range with hemi-glide with facilitation/min cues for normal movement patterns  In standing, AROM shoulder flex in low range with facilitation/min cues for normal movement patterns, progressed to performing without facilitation.   Wt. Bearing in modified quadraped with cat/cow positions and forward/backward wt. Shifts for incr scapular stability and mobility and decr tone with min cueing.  In sitting, sliding on table in forward flex and abduction as able to grasp/release small cylinder objects with min tactile facilitation/cues for elbow ext to help with normal movement patterns.   In sitting, grasping cup and bringing to mouth and then releasing in prep to drink with min facilitation/mod cues for normal movement patterns.   Passive finger ext stretch intermittently for tone.  In sitting, flipping large cards with focus on finger extension, supination with min facilitation/cueing for normal movement patterns.  Passive finger ext stretch intermittently for tone.  Sweeping with BUEs with good control and normal movement patterns.   Recommended pt call afternoon prior to next scheduled appt to confirm date ext approved.                    OT Education - 03/23/16 1304    Education Details supination and wrist ext AROM   Person(s) Educated Patient   Methods Explanation;Handout;Verbal cues;Demonstration   Comprehension Verbalized understanding;Returned demonstration;Verbal cues required          OT Short Term Goals - 03/17/16 1335      OT SHORT TERM GOAL #1   Title Pt will be independent with initial HEP.--check STGs modified to 02/25/16 due to delay in [place] approval   Baseline reports only 1-2 exercises for RUE from home health   Time 4   Period Weeks   Status Achieved     OT SHORT TERM GOAL #2   Title Pt will verbalize understanding of edema management techniques and proper positioning of RUE to prevent pain/injury.   Baseline dependent, min-mod edema in R hand   Time 4   Period Weeks   Status Achieved  02/25/16  needs min cueing, improves with positioning.  03/17/16     OT SHORT TERM GOAL #3   Title Pt will be independent with splint wear/care prn for improved positioning.  Baseline no splint   Time 4   Period Weeks   Status Deferred  02/25/16 at this time as pt responds well to wt. bearing (may consider full composite extension splint prn)     OT SHORT TERM GOAL #4   Title Pt will demo at least 20* R shoulder flex in prep for functional reach.   Baseline no true active shoulder flex, approx 30* AAROM   Time 4   Period Weeks   Status Achieved  03/03/16:  20-25* with min compensation     OT SHORT TERM GOAL #5   Title Pt will be demo at least 25% gross finger flex in prep  for functional grasp.   Baseline trace   Time 4   Period Weeks   Status Achieved  02/01/16     OT SHORT TERM GOAL #6   Title Pt will use RUE as stabilizer for selected tasks.   Baseline unable to use RUE functionally   Time 4   Period Weeks   Status Achieved  03/03/16:  stabilizer-gross A           OT Long Term Goals - 03/23/16 1309      OT LONG TERM GOAL #1   Title Pt will be independent with updated HEP--check LTGs 03/24/16 (modified due to delay with medicaid approval)   Baseline dependent   Time 8   Period Weeks   Status Achieved  03/21/16     OT LONG TERM GOAL #2   Title Pt will perform simple cooking task mod I.   Baseline only warming food in microwave currently (family prepares)   Time 8   Period Weeks   Status On-going  03/17/16:  Pt reports warming items up on stovetop     OT LONG TERM GOAL #3   Title Pt will demo at least 30* R shoulder flex in prep for functional reach.  Updated 03/17/16:  Pt will demo at least 45* R shoulder flex for functional reach.   Baseline no true active shoulder flex, approx 30* AAROM   Time 8   Period Weeks   Status Revised  03/17/16 met initial goal 40* and updated goal.  03/23/16  40*     OT LONG TERM GOAL #4   Title Pt will use RUE as gross assist for selected tasks.   Baseline unable to use RUE functionally.   Time 8   Period Weeks   Status Achieved  03/17/16     OT LONG TERM GOAL #5   Title Pt will be able to perform light weightbearing through RUE without pain to assist in transfers/IADLs.   Baseline unable to use RUE functionally.   Time 8   Period Weeks   Status Achieved  03/03/16     OT LONG TERM GOAL #6   Title Added  03/17/16:  Pt will be able to grasp/release small cylinder object in 4/5 trials.   Status On-going               Plan - 03/23/16 1307    Clinical Impression Statement Pt demo improved AAROM shoulder abduction and improved ability to grasp/release objects.   Rehab Potential Good    OT Frequency 2x / week   OT Duration 8 weeks   OT Treatment/Interventions Self-care/ADL training;Fluidtherapy;DME and/or AE instruction;Moist Heat;Splinting;Patient/family education;Therapeutic exercises;Contrast Bath;Ultrasound;Therapeutic exercise;Therapeutic activities;Passive range of motion;Functional Mobility Training;Neuromuscular education;Cryotherapy;Electrical Stimulation;Parrafin;Manual Therapy;Energy conservation   Plan If date extension approved, neuro re-ed, RUE functional use; d/c next week due to [facility]  visit limitations   OT Home Exercise Plan Education provided:  12/07/15 edema managment, initial HEP; 02/01/16  added to HEP; 03/07/16 added to HEP   Consulted and Agree with Plan of Care Patient;Family member/caregiver   Family Member Consulted daughter      Patient will benefit from skilled therapeutic intervention in order to improve the following deficits and impairments:  Decreased mobility, Decreased strength, Impaired UE functional use, Decreased knowledge of use of DME, Impaired tone, Increased edema, Decreased coordination, Decreased range of motion, Decreased balance  Visit Diagnosis: Hemiplegia and hemiparesis following cerebral infarction affecting right dominant side (HCC)  Other lack of coordination    Problem List Patient Active Problem List   Diagnosis Date Noted  . Subacromial impingement of right shoulder 02/26/2016  . Right hemiparesis ([place])   . Myofascial muscle pain   . Cerebral infarction due to thrombosis of left middle cerebral artery ([place])   . Prediabetes   . Stroke ([place]) 10/25/2015  . CVA (cerebral infarction) 10/25/2015  . Stroke (cerebrum) ([place]) 10/25/2015  . Essential hypertension 10/25/2015  . Hyperlipidemia 10/25/2015  . Acute ischemic stroke ([place])   . Cerebral infarction due to unspecified mechanism   . Dysarthria     [facility] 03/23/2016, 1:10 PM  [place] [address] [place], [place], [address] Phone: [phone]   Fax:  [phone]  Name: Zyire Eidson MRN: [id number] Date of Birth: Aug 16, 1965   Vianne Bulls, OTR/L [facility] [address] [place], [place]  [address] [phone] phone [phone] 03/23/16 5:50 PM

## 2016-03-29 ENCOUNTER — Ambulatory Visit: Payer: Medicaid Other | Admitting: Occupational Therapy

## 2016-03-31 ENCOUNTER — Ambulatory Visit: Payer: Medicaid Other | Admitting: Occupational Therapy

## 2016-03-31 ENCOUNTER — Ambulatory Visit: Payer: Self-pay | Admitting: Neurology

## 2016-04-05 ENCOUNTER — Ambulatory Visit: Payer: Medicaid Other | Admitting: Occupational Therapy

## 2016-04-05 ENCOUNTER — Encounter: Payer: Self-pay | Admitting: Neurology

## 2016-04-07 ENCOUNTER — Encounter: Payer: Self-pay | Admitting: Occupational Therapy

## 2016-04-07 NOTE — Therapy (Signed)
[place] [address] [place], [place], [address] Phone: [phone]   Fax:  [phone]  Patient Details  Name: Brad Tran MRN: [id number] Date of Birth: 01/30/1966 Referring Provider:  No ref. provider found  Encounter Date: 04/07/2016   OCCUPATIONAL THERAPY DISCHARGE SUMMARY  Visits from Start of Care: 15  Current functional level related to goals / functional outcomes:     OT Short Term Goals - 03/17/16 1335      OT SHORT TERM GOAL #1   Title Pt will be independent with initial HEP.--check STGs modified to 02/25/16 due to delay in [place] approval   Baseline reports only 1-2 exercises for RUE from home health   Time 4   Period Weeks   Status Achieved     OT SHORT TERM GOAL #2   Title Pt will verbalize understanding of edema management techniques and proper positioning of RUE to prevent pain/injury.   Baseline dependent, min-mod edema in R hand   Time 4   Period Weeks   Status Achieved  02/25/16  needs min cueing, improves with positioning.  03/17/16     OT SHORT TERM GOAL #3   Title Pt will be independent with splint wear/care prn for improved positioning.   Baseline no splint   Time 4   Period Weeks   Status Deferred  02/25/16 at this time as pt responds well to wt. bearing (may consider full composite extension splint prn)     OT SHORT TERM GOAL #4   Title Pt will demo at least 20* R shoulder flex in prep for functional reach.   Baseline no true active shoulder flex, approx 30* AAROM   Time 4   Period Weeks   Status Achieved  03/03/16:  20-25* with min compensation     OT SHORT TERM GOAL #5   Title Pt will be demo at least 25% gross finger flex in prep for functional grasp.   Baseline trace   Time 4   Period Weeks   Status Achieved  02/01/16     OT SHORT TERM GOAL #6   Title Pt will use RUE as stabilizer for selected tasks.   Baseline unable to use RUE functionally   Time 4   Period  Weeks   Status Achieved  03/03/16:  stabilizer-gross A          OT Long Term Goals - 03/23/16 1309      OT LONG TERM GOAL #1   Title Pt will be independent with updated HEP--check LTGs 03/24/16 (modified due to delay with medicaid approval)   Baseline dependent   Time 8   Period Weeks   Status Achieved  03/21/16     OT LONG TERM GOAL #2   Title Pt will perform simple cooking task mod I.   Baseline only warming food in microwave currently (family prepares)   Time 8   Period Weeks   Status Not met. 03/17/16:  Pt reports warming items up on stovetop     OT LONG TERM GOAL #3   Title Pt will demo at least 30* R shoulder flex in prep for functional reach.  Updated 03/17/16:  Pt will demo at least 45* R shoulder flex for functional reach.   Baseline no true active shoulder flex, approx 30* AAROM   Time 8   Period Weeks   Status  03/17/16 met initial goal 13* and updated goal.  03/23/16  40*  Revised goal not met  OT LONG TERM GOAL #4   Title Pt will use RUE as gross assist for selected tasks.   Baseline unable to use RUE functionally.   Time 8   Period Weeks   Status Achieved  03/17/16     OT LONG TERM GOAL #5   Title Pt will be able to perform light weightbearing through RUE without pain to assist in transfers/IADLs.   Baseline unable to use RUE functionally.   Time 8   Period Weeks   Status Achieved  03/03/16     OT LONG TERM GOAL #6   Title Added  03/17/16:  Pt will be able to grasp/release small cylinder object in 4/5 trials.   Status 04/07/16  Not consistent        Remaining deficits: R hemiparesis with spasticity, decr AROM, pain, decr strength, decr coordination resulting in decr dominant RUE functional use   Education / Equipment: Pt/family instructed in HEP and verbalized understanding.  Plan: Patient agrees to discharge.  Patient goals were partially met. Patient is being discharged due to financial reasons.  (Medicaid visit limits)?????        [facility] 04/07/2016, 5:11 PM  [place] [address] [place], [place], [address] Phone: [phone]   Fax:  Be[place], OTR/L [facility] [address] [place], [place]  [address] [phone] phone [phone] 04/07/16 5:14 PM

## 2016-04-19 ENCOUNTER — Encounter: Payer: Medicaid Other | Attending: Physical Medicine & Rehabilitation

## 2016-04-19 ENCOUNTER — Encounter: Payer: Self-pay | Admitting: Physical Medicine & Rehabilitation

## 2016-04-19 ENCOUNTER — Ambulatory Visit (HOSPITAL_BASED_OUTPATIENT_CLINIC_OR_DEPARTMENT_OTHER): Payer: Medicaid Other | Admitting: Physical Medicine & Rehabilitation

## 2016-04-19 ENCOUNTER — Telehealth: Payer: Self-pay

## 2016-04-19 ENCOUNTER — Encounter: Payer: Self-pay | Admitting: Family Medicine

## 2016-04-19 VITALS — BP 139/89 | HR 84

## 2016-04-19 DIAGNOSIS — I69354 Hemiplegia and hemiparesis following cerebral infarction affecting left non-dominant side: Secondary | ICD-10-CM | POA: Diagnosis not present

## 2016-04-19 DIAGNOSIS — G8110 Spastic hemiplegia affecting unspecified side: Secondary | ICD-10-CM | POA: Diagnosis not present

## 2016-04-19 DIAGNOSIS — E785 Hyperlipidemia, unspecified: Secondary | ICD-10-CM | POA: Diagnosis not present

## 2016-04-19 DIAGNOSIS — I10 Essential (primary) hypertension: Secondary | ICD-10-CM | POA: Diagnosis not present

## 2016-04-19 DIAGNOSIS — Z5189 Encounter for other specified aftercare: Secondary | ICD-10-CM | POA: Diagnosis present

## 2016-04-19 MED ORDER — METOPROLOL TARTRATE 50 MG PO TABS: 50.0000 mg | ORAL_TABLET | Freq: Two times a day (BID) | ORAL | 3 refills | Status: DC

## 2016-04-19 MED FILL — METOPROLOL TARTRATE 50 MG T: 50 | 34 days supply | Qty: 68 | Fill #0

## 2016-04-19 NOTE — Patient Instructions (Signed)

## 2016-04-19 NOTE — Telephone Encounter (Signed)
This has been ref[facility] to [facility]. Thanks!

## 2016-04-19 NOTE — Progress Notes (Signed)
Subjective:    Patient ID: Brad Tran, male    DOB: 08/27/1965, 51 y.o.   MRN: [id number]  HPI No issues with Botox Patient feels looser overall in the arm and forearm as well as hand. Still feels tight at the thumb and index finger. Has finished outpatient therapy about 1 month ago. Doing home exercise program. In addition, he is going to the gym and does bicycling 10 minutes a day. Tried driving in parking lot with his daughter, according to her. There were no issues. Pain Inventory Average Pain 0 Pain Right Now 0 My pain is .  In the last 24 hours, has pain interfered with the following? General activity 0 Relation with others 0 Enjoyment of life 0 What TIME of day is your pain at its worst? . Sleep (in general) Fair  Pain is worse with: . Pain improves with: . Relief from Meds: 0  Mobility walk without assistance ability to climb steps?  yes do you drive?  yes  Function not employed: date last employed .  Neuro/Psych No problems in this area  Prior Studies Any changes since last visit?  no  Physicians involved in your care Any changes since last visit?  no   Family History  Problem Relation Age of Onset  . Heart attack Father   . Stroke Father    Social History   Social History  . Marital status: Divorced    Spouse name: N/A  . Number of children: N/A  . Years of education: N/A   Social History Main Topics  . Smoking status: Never Smoker  . Smokeless tobacco: Never Used  . Alcohol use Yes  . Drug use: No  . Sexual activity: Not on file   Other Topics Concern  . Not on file   Social History Narrative  . No narrative on file   No past surgical history on file. Past Medical History:  Diagnosis Date  . Hyperlipidemia   . Hypertension   . Stroke [facility](HCC)    There were no vitals taken for this visit.  Opioid Risk Score:   Fall Risk Score:  `1  Depression screen PHQ 2/9  Depression screen [facility] 03/17/2016 02/26/2016 12/22/2015 11/23/2015    Decreased Interest 0 0 0 0  Down, Depressed, Hopeless 0 0 0 0  PHQ - 2 Score 0 0 0 0  Altered sleeping - - - 0  Tired, decreased energy - - - 1  Change in appetite - - - 0  Feeling bad or failure about yourself  - - - 0  Trouble concentrating - - - 1  Moving slowly or fidgety/restless - - - 1  Suicidal thoughts - - - 0  PHQ-9 Score - - - 3  Difficult doing work/chores - - - Not difficult at all   Review of Systems  Constitutional: Negative.   HENT: Negative.   Eyes: Negative.   Respiratory: Negative.   Cardiovascular: Negative.   Gastrointestinal: Negative.   Endocrine: Negative.   Genitourinary: Negative.   Musculoskeletal: Negative.   Skin: Negative.   Allergic/Immunologic: Negative.   Neurological: Negative.   Hematological: Negative.   Psychiatric/Behavioral: Negative.   All other systems reviewed and are negative.      Objective:   Physical Exam  Constitutional: He is oriented to person, place, and time. He appears well-developed and well-nourished.  HENT:  Head: Normocephalic and atraumatic.  Eyes: Conjunctivae and EOM are normal. Pupils are equal, round, and reactive to light.  Neck:  Normal range of motion.  Neurological: He is alert and oriented to person, place, and time.  Skin: Skin is warm and dry.  Nursing note and vitals reviewed.   Ashworth grade 3 in the thumb flexor and index finger flexor. Ashworth 2 at the middle, ring and little finger flexors. Ashworth 2 at the biceps. Line mood and affect are appropriate Motor strength is 3 minus at the right deltoid, biceps, triceps, finger flexors and extensors. Right lower extremity strength 4 minus at the hip flexor, knee extensor, 3 minus at the ankle dorsiflexor, wears a off-the-shelf AFO. Ambulates with no assistive device, except for AFO. Extremities without edema Mood and affect are appropriate      Assessment & Plan:  1. 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  However, would recommend injecting the right FPL as well as targeting specifically the right. FDS, FDP to index finger.  Recommend following dosing Biceps 100 units FDS 50 units FDP 50 units FPL 25 units FCR 25 units Brachial radialis, 25 units  2. We also discussed driving. He has tried driving with his daughter in the car. She states there were no issues. Recommend graduated return to driving as below Graduated return to driving instructions were provided. It is recommended that the patient first drives with another licensed driver in an empty parking lot. If the patient does well with this, and they can drive on a quiet street with the licensed driver. If the patient does well with this they can drive on a busy street with a licensed driver. If the patient does well with this, the next time out they can go by himself. For the first month after resuming driving, I recommend no nighttime or Interstate driving.

## 2016-04-25 MED FILL — ATORVASTATIN 80 MG TABLET: 80 | 30 days supply | Qty: 30 | Fill #1

## 2016-05-19 ENCOUNTER — Telehealth: Payer: Self-pay

## 2016-05-19 DIAGNOSIS — I10 Essential (primary) hypertension: Secondary | ICD-10-CM

## 2016-05-19 DIAGNOSIS — E7800 Pure hypercholesterolemia, unspecified: Secondary | ICD-10-CM

## 2016-05-19 MED ORDER — ATORVASTATIN CALCIUM 80 MG PO TABS: 80.0000 mg | ORAL_TABLET | Freq: Every day | ORAL | 3 refills | Status: DC

## 2016-05-19 MED ORDER — METOPROLOL TARTRATE 50 MG PO TABS: 50.0000 mg | ORAL_TABLET | Freq: Two times a day (BID) | ORAL | 3 refills | Status: DC

## 2016-05-19 MED FILL — METOPROLOL TARTRATE 50 MG T: 50 | 34 days supply | Qty: 68 | Fill #0

## 2016-05-19 MED FILL — ATORVASTATIN 80 MG TABLET: 80 | 34 days supply | Qty: 34 | Fill #0

## 2016-05-19 NOTE — Telephone Encounter (Signed)
Sent into pharmacy. Thanks!  

## 2016-06-09 ENCOUNTER — Ambulatory Visit: Payer: Medicaid Other | Admitting: Physical Medicine & Rehabilitation

## 2016-06-09 ENCOUNTER — Encounter: Payer: Medicaid Other | Attending: Physical Medicine & Rehabilitation

## 2016-06-09 DIAGNOSIS — I69354 Hemiplegia and hemiparesis following cerebral infarction affecting left non-dominant side: Secondary | ICD-10-CM | POA: Insufficient documentation

## 2016-06-09 DIAGNOSIS — I10 Essential (primary) hypertension: Secondary | ICD-10-CM | POA: Insufficient documentation

## 2016-06-09 DIAGNOSIS — E785 Hyperlipidemia, unspecified: Secondary | ICD-10-CM | POA: Insufficient documentation

## 2016-06-09 DIAGNOSIS — Z5189 Encounter for other specified aftercare: Secondary | ICD-10-CM | POA: Insufficient documentation

## 2016-06-14 ENCOUNTER — Telehealth: Payer: Self-pay

## 2016-06-14 DIAGNOSIS — I10 Essential (primary) hypertension: Secondary | ICD-10-CM

## 2016-06-14 DIAGNOSIS — E7800 Pure hypercholesterolemia, unspecified: Secondary | ICD-10-CM

## 2016-06-14 MED ORDER — METOPROLOL TARTRATE 50 MG PO TABS: 50.0000 mg | ORAL_TABLET | Freq: Two times a day (BID) | ORAL | 3 refills | Status: DC

## 2016-06-14 MED ORDER — ATORVASTATIN CALCIUM 80 MG PO TABS: 80.0000 mg | ORAL_TABLET | Freq: Every day | ORAL | 3 refills | Status: DC

## 2016-06-14 NOTE — Telephone Encounter (Signed)
This has been refilled.  Thanks. 

## 2016-06-15 ENCOUNTER — Emergency Department (HOSPITAL_COMMUNITY)
Admission: EM | Admit: 2016-06-15 | Discharge: 2016-06-15 | Disposition: A | Discharge status: Home / Self Care | Payer: Medicaid Other | Attending: Emergency Medicine | Admitting: Emergency Medicine

## 2016-06-15 ENCOUNTER — Encounter (HOSPITAL_COMMUNITY): Payer: Self-pay

## 2016-06-15 DIAGNOSIS — I10 Essential (primary) hypertension: Secondary | ICD-10-CM | POA: Diagnosis not present

## 2016-06-15 DIAGNOSIS — R109 Unspecified abdominal pain: Secondary | ICD-10-CM

## 2016-06-15 DIAGNOSIS — Z7982 Long term (current) use of aspirin: Secondary | ICD-10-CM | POA: Insufficient documentation

## 2016-06-15 DIAGNOSIS — Z8673 Personal history of transient ischemic attack (TIA), and cerebral infarction without residual deficits: Secondary | ICD-10-CM | POA: Diagnosis not present

## 2016-06-15 DIAGNOSIS — I252 Old myocardial infarction: Secondary | ICD-10-CM | POA: Insufficient documentation

## 2016-06-15 DIAGNOSIS — Z79899 Other long term (current) drug therapy: Secondary | ICD-10-CM | POA: Diagnosis not present

## 2016-06-15 DIAGNOSIS — R1033 Periumbilical pain: Secondary | ICD-10-CM | POA: Insufficient documentation

## 2016-06-15 LAB — CBC
HCT: 40.5 % (ref 39.0–52.0)
Hemoglobin: 13.7 g/dL (ref 13.0–17.0)
MCH: 29.4 pg (ref 26.0–34.0)
MCHC: 33.8 g/dL (ref 30.0–36.0)
MCV: 86.9 fL (ref 78.0–100.0)
PLATELETS: 177 10*3/uL (ref 150–400)
RBC: 4.66 MIL/uL (ref 4.22–5.81)
RDW: 12.9 % (ref 11.5–15.5)
WBC: 6.9 10*3/uL (ref 4.0–10.5)

## 2016-06-15 LAB — URINALYSIS, ROUTINE W REFLEX MICROSCOPIC
Bilirubin Urine: NEGATIVE
GLUCOSE, UA: NEGATIVE mg/dL
HGB URINE DIPSTICK: NEGATIVE
Ketones, ur: NEGATIVE mg/dL
Leukocytes, UA: NEGATIVE
Nitrite: NEGATIVE
PH: 5 (ref 5.0–8.0)
PROTEIN: NEGATIVE mg/dL
Specific Gravity, Urine: 1.028 (ref 1.005–1.030)

## 2016-06-15 LAB — COMPREHENSIVE METABOLIC PANEL
ALT: 50 U/L (ref 17–63)
AST: 21 U/L (ref 15–41)
Albumin: 4.2 g/dL (ref 3.5–5.0)
Alkaline Phosphatase: 58 U/L (ref 38–126)
Anion gap: 8 (ref 5–15)
BUN: 25 mg/dL — AB (ref 6–20)
CHLORIDE: 106 mmol/L (ref 101–111)
CO2: 24 mmol/L (ref 22–32)
CREATININE: 1.27 mg/dL — AB (ref 0.61–1.24)
Calcium: 8.9 mg/dL (ref 8.9–10.3)
GFR calc non Af Amer: >60 mL/min (ref >60)
Glucose, Bld: 162 mg/dL — ABNORMAL HIGH (ref 65–99)
Potassium: 3.2 mmol/L — ABNORMAL LOW (ref 3.5–5.1)
SODIUM: 138 mmol/L (ref 135–145)
Total Bilirubin: 0.5 mg/dL (ref 0.3–1.2)
Total Protein: 6.7 g/dL (ref 6.5–8.1)

## 2016-06-15 LAB — LIPASE, BLOOD: LIPASE: 30 U/L (ref 11–51)

## 2016-06-15 NOTE — ED Notes (Signed)
Pt ambulatory and independent at discharge.  Verbalized understanding of discharge instructions 

## 2016-06-15 NOTE — ED Notes (Signed)
ED Provider at bedside. 

## 2016-06-15 NOTE — ED Provider Notes (Signed)
[facility]-EMERGENCY DEPT Provider Note   CSN: [id number] Arrival date & time: 06/15/16  1354     History   Chief Complaint Chief Complaint  Patient presents with  . Abdominal Pain    HPI Brad Tran is a 51 y.o. male.  HPI Pt presented to the ED with abdominal pain.  He was at home this am when he started having an upset stomach.  The symptoms persisted and the progressed to moderate abdominal pain located around the periumbilical region.  This lasted for approximately 4 hours.   The pain then resolved and since that time he has felt fine.  He denies vomiting, dysuria, diarrhea or constipation.  No chest pain or shortness of breath.  No history of prior abdominal pain issues Past Medical History:  Diagnosis Date  . Hyperlipidemia   . Hypertension   . Stroke [facility])     Patient Active Problem List   Diagnosis Date Noted  . Subacromial impingement of right shoulder 02/26/2016  . Right hemiparesis (HCC)   . Myofascial muscle pain   . 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 Prediabetes   . Stroke (HCC) 10/25/2015  . CVA (cerebral infarction) 10/25/2015  . Stroke (cerebrum) (HCC) 10/25/2015  . Essential hypertension 10/25/2015  . Hyperlipidemia 10/25/2015  . Acute ischemic stroke (HCC)   . Cerebral infarction due to unspecified mechanism   . Dysarthria     History reviewed. No pertinent surgical history.     Home Medications    Prior to Admission medications   Medication Sig Start Date End Date Taking? Authorizing Provider  aspirin EC 325 MG tablet Take 1 tablet (325 mg total) by mouth daily. 03/17/16  Yes Henrietta Hoover, NP  atorvastatin (LIPITOR) 80 MG tablet Take 1 tablet (80 mg total) by mouth daily at 6 PM. 06/14/16  Yes Henrietta Hoover, NP  Dextromethorphan-Guaifenesin (CORICIDIN HBP CONGESTION/COUGH) 10-200 MG CAPS Take 2 capsules by mouth once.   Yes Historical Provider, MD  DM-Phenylephrine-Acetaminophen (VICKS DAYQUIL  COLD & FLU) 10-5-325 MG/15ML LIQD Take 30 mLs by mouth once.   Yes Historical Provider, MD  metoprolol (LOPRESSOR) 50 MG tablet Take 1 tablet (50 mg total) by mouth 2 (two) times daily. 06/14/16  Yes Henrietta Hoover, NP    Family History Family History  Problem Relation Age of Onset  . Heart attack Father   . Stroke Father     Social History Social History  Substance Use Topics  . Smoking status: Never Smoker  . Smokeless tobacco: Never Used  . Alcohol use No     Allergies   Patient has no known allergies.   Review of Systems Review of Systems  All other systems reviewed and are negative.    Physical Exam Updated Vital Signs BP 149/87 (BP Location: Left Arm)   Pulse 66   Temp 97.8 F (36.6 C) (Oral)   Resp 16   Ht 5\' 3"  (1.6 m)   Wt 74.4 kg   SpO2 99%   BMI 29.05 kg/m   Physical Exam  Constitutional: He appears well-developed and well-nourished. No distress.  HENT:  Head: Normocephalic and atraumatic.  Right Ear: External ear normal.  Left Ear: External ear normal.  Eyes: Conjunctivae are normal. Right eye exhibits no discharge. Left eye exhibits no discharge. No scleral icterus.  Neck: Neck supple. No tracheal deviation present.  Cardiovascular: Normal rate, regular rhythm and intact distal pulses.   Pulmonary/Chest: Effort normal and breath sounds  normal. No stridor. No respiratory distress. He has no wheezes. He has no rales.  Abdominal: Soft. Bowel sounds are normal. He exhibits no distension. There is no tenderness. There is no rebound and no guarding.  Musculoskeletal: He exhibits no edema or tenderness.  Neurological: He is alert. He has normal strength. No cranial nerve deficit (no facial droop, extraocular movements intact, no slurred speech) or sensory deficit. He exhibits normal muscle tone. He displays no seizure activity. Coordination normal.  Skin: Skin is warm and dry. No rash noted.  Psychiatric: He has a normal mood and affect.  Nursing note  and vitals reviewed.    ED Treatments / Results  Labs (all labs ordered are listed, but only abnormal results are displayed) Labs Reviewed  COMPREHENSIVE METABOLIC PANEL - Abnormal; Notable for the following:       Result Value   Potassium 3.2 (*)    Glucose, Bld 162 (*)    BUN 25 (*)    Creatinine, Ser 1.27 (*)    All other components within normal limits  LIPASE, BLOOD  CBC  URINALYSIS, ROUTINE W REFLEX MICROSCOPIC    Procedures Procedures (including critical care time)  Medications Ordered in ED Medications - No data to display   Initial Impression / Assessment and Plan / ED Course  I have reviewed the triage vital signs and the nursing notes.  Pertinent labs & imaging results that were available during my care of the patient were reviewed by me and considered in my medical decision making (see chart for details).  Pt presented to the ED with abdominal pain.  Sx all spontaneously resolved.  No abdominal ttp on exam.    Doubt appendicitis, biliary colic, colitis or other acute infection.  No mass or ttp on exam, doubt AAA or other emergent pathology.  No cp or shortness of breath to suggest some type of anginal equivalent.  Pt feels fine right now.   Lab tests are reassuring.  Discussed follow up with PCP regarding his creatinine.   Return for recurrent pain, chest pain or shortness of breath.  Final Clinical Impressions(s) / ED Diagnoses   Final diagnoses:  Abdominal pain, unspecified abdominal location    New Prescriptions New Prescriptions   No medications on file     Linwood DibblesJon Teion Ballin, MD 06/15/16 1536

## 2016-06-15 NOTE — Discharge Instructions (Signed)
Monitor for recurrent symptoms, fever, vomiting, chest pain or shortness of breath.  Follow up with your doctor later in the week or early next week to be rechecked

## 2016-06-15 NOTE — ED Triage Notes (Addendum)
Patient c/o generalized abdominal pain today. Patient denies N/V/D. Patient had a normal BM yesterday. Patient also reported that he has been "coining" his abdomen. Patient has red streaks on his abdomen. Patient states that he heated/sterilized the coin and rubbed his abdomen.

## 2016-06-20 ENCOUNTER — Encounter: Payer: Self-pay | Admitting: Physical Medicine & Rehabilitation

## 2016-06-20 ENCOUNTER — Ambulatory Visit (HOSPITAL_BASED_OUTPATIENT_CLINIC_OR_DEPARTMENT_OTHER): Payer: Medicaid Other | Admitting: Physical Medicine & Rehabilitation

## 2016-06-20 ENCOUNTER — Encounter: Payer: Medicaid Other | Attending: Physical Medicine & Rehabilitation

## 2016-06-20 VITALS — BP 140/89 | HR 57

## 2016-06-20 DIAGNOSIS — Z5189 Encounter for other specified aftercare: Secondary | ICD-10-CM | POA: Insufficient documentation

## 2016-06-20 DIAGNOSIS — I69354 Hemiplegia and hemiparesis following cerebral infarction affecting left non-dominant side: Secondary | ICD-10-CM | POA: Insufficient documentation

## 2016-06-20 DIAGNOSIS — I10 Essential (primary) hypertension: Secondary | ICD-10-CM | POA: Insufficient documentation

## 2016-06-20 DIAGNOSIS — E785 Hyperlipidemia, unspecified: Secondary | ICD-10-CM | POA: Diagnosis not present

## 2016-06-20 DIAGNOSIS — G8110 Spastic hemiplegia affecting unspecified side: Secondary | ICD-10-CM | POA: Diagnosis not present

## 2016-06-20 MED FILL — METOPROLOL TARTRATE 50 MG T: 50 | 34 days supply | Qty: 68 | Fill #0

## 2016-06-20 MED FILL — ATORVASTATIN 80 MG TABLET: 80 | 34 days supply | Qty: 34 | Fill #0

## 2016-06-20 NOTE — Progress Notes (Signed)
Botox Injection for spasticity using needle EMG guidance  Dilution: 50 Units/ml Indication: Severe spasticity which interferes with ADL,mobility and/or  hygiene and is unresponsive to medication management and other conservative care Informed consent was obtained after describing risks and benefits of the procedure with the patient. This includes bleeding, bruising, infection, excessive weakness, or medication side effects. A REMS form is on file and signed. Needle: 27g 1" needle electrode Number of units per muscle Biceps 100 units FDS 50 units FDP 50 units FPL 25 units FCR 25 units Brachial radialis, 25 units All injections were done after obtaining appropriate EMG activity, or e stim eliciting Index flexion in case of FDP and FDS  and after negative drawback for blood. The patient tolerated the procedure well. Post procedure instructions were given. A followup appointment was made.

## 2016-06-20 NOTE — Patient Instructions (Signed)

## 2016-06-27 ENCOUNTER — Ambulatory Visit: Payer: Medicaid Other | Admitting: Family Medicine

## 2016-07-04 ENCOUNTER — Ambulatory Visit: Payer: Medicaid Other | Admitting: Family Medicine

## 2016-07-11 ENCOUNTER — Ambulatory Visit (INDEPENDENT_AMBULATORY_CARE_PROVIDER_SITE_OTHER): Payer: Medicaid Other | Admitting: Family Medicine

## 2016-07-11 ENCOUNTER — Encounter: Payer: Self-pay | Admitting: Family Medicine

## 2016-07-11 VITALS — BP 140/78 | HR 65 | Temp 98.7°F | Ht 63.0 in | Wt 169.0 lb

## 2016-07-11 DIAGNOSIS — I10 Essential (primary) hypertension: Secondary | ICD-10-CM

## 2016-07-11 DIAGNOSIS — E785 Hyperlipidemia, unspecified: Secondary | ICD-10-CM | POA: Diagnosis not present

## 2016-07-11 DIAGNOSIS — I639 Cerebral infarction, unspecified: Secondary | ICD-10-CM | POA: Diagnosis not present

## 2016-07-11 DIAGNOSIS — E7800 Pure hypercholesterolemia, unspecified: Secondary | ICD-10-CM | POA: Diagnosis not present

## 2016-07-11 MED ORDER — ASPIRIN EC 325 MG PO TBEC: 325.0000 mg | DELAYED_RELEASE_TABLET | Freq: Every day | ORAL | 0 refills | Status: DC

## 2016-07-11 MED ORDER — METOPROLOL TARTRATE 50 MG PO TABS: 50.0000 mg | ORAL_TABLET | Freq: Two times a day (BID) | ORAL | 3 refills | Status: DC

## 2016-07-11 MED ORDER — ATORVASTATIN CALCIUM 80 MG PO TABS: 80.0000 mg | ORAL_TABLET | Freq: Every day | ORAL | 3 refills | Status: DC

## 2016-07-11 NOTE — Progress Notes (Addendum)
Patient ID: Brad Tran, male    DOB: 1965-04-22, 51 y.o.   MRN: [id number]  PCP: Bing Neighbors, FNP  Chief Complaint  Patient presents with  . Follow-up    HOSP FOR ABDOMINAL  . Medication Refill    LIPITOR, METOPROLOL    Subjective:  HPI  Brad Tran is a 51 y.o. male presents for evaluation of hospital follow-up and requests refills on both Lipitor and Metoprolol. Chronic problems include Stroke, Hypertension, Right Hemiparesis He reports consistent home monitoring of blood pressure with consistent readings in 130-140/80-90. Continues to consume salt added to food. However he reports that he drinks lots of water. Hx of CVA with right sides weakness which he has received Botox injections under the care of Dr. [name] .  He is on high ASA 325 mg for antiplatelet management.  For hypertension management he takes Metoprolol 50 mg without any know side effects from medication. Brad Tran reports that he was recently seen and evaluated in the hospital for 4 day hx of abdominal pain.  The abdominal pain spontansouly resolved without treatment. During 06/15/16 hospital visit,he was noted to have mildly low hypokalemia (3.2) and elevated BUN(25) and Creatinine (1.27).  Social History   Social History  . Marital status: Divorced    Spouse name: N/A  . Number of children: N/A  . Years of education: N/A   Occupational History  . Not on file.   Social History Main Topics  . Smoking status: Never Smoker  . Smokeless tobacco: Never Used  . Alcohol use No  . Drug use: No  . Sexual activity: Not on file   Other Topics Concern  . Not on file   Social History Narrative  . No narrative on file    Family History  Problem Relation Age of Onset  . Heart attack Father   . Stroke Father    Review of Systems See HPI  Patient Active Problem List   Diagnosis Date Noted  . Subacromial impingement of right shoulder 02/26/2016  . Right hemiparesis (HCC)   . Myofascial muscle  pain   . 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 Prediabetes   . Stroke (HCC) 10/25/2015  . CVA (cerebral infarction) 10/25/2015  . Stroke (cerebrum) (HCC) 10/25/2015  . Essential hypertension 10/25/2015  . Hyperlipidemia 10/25/2015  . Acute ischemic stroke (HCC)   . Cerebral infarction due to unspecified mechanism   . Dysarthria     No Known Allergies  Prior to Admission medications   Medication Sig Start Date End Date Taking? Authorizing Provider  aspirin EC 325 MG tablet Take 1 tablet (325 mg total) by mouth daily. 03/17/16  Yes Henrietta Hoover, NP  atorvastatin (LIPITOR) 80 MG tablet Take 1 tablet (80 mg total) by mouth daily at 6 PM. 06/14/16  Yes Henrietta Hoover, NP  metoprolol (LOPRESSOR) 50 MG tablet Take 1 tablet (50 mg total) by mouth 2 (two) times daily. 06/14/16  Yes Henrietta Hoover, NP    Past Medical, Surgical Family and Social History reviewed and updated.    Objective:   Today's Vitals   07/11/16 1319  BP: 140/78  Pulse: 65  Temp: 98.7 F (37.1 C)  TempSrc: Oral  SpO2: 99%  Weight: 169 lb (76.7 kg)  Height: 5\' 3"  (1.6 m)    Wt Readings from Last 3 Encounters:  09/01/16 174 lb (78.9 kg)  07/11/16 169 lb (76.7 kg)  06/15/16 164 lb (74.4 kg)  Physical Exam  Constitutional: He is oriented to person, place, and time. He appears well-developed and well-nourished.  HENT:  Head: Normocephalic and atraumatic.  Nose: Nose normal.  Mouth/Throat: Oropharynx is clear and moist.  Eyes: Conjunctivae and EOM are normal. Pupils are equal, round, and reactive to light.  Neck: Normal range of motion. Neck supple. No thyromegaly present.  Cardiovascular: Normal rate, regular rhythm, normal heart sounds and intact distal pulses.   Pulmonary/Chest: Effort normal and breath sounds normal.  Abdominal: Soft. Bowel sounds are normal. He exhibits no distension. There is no tenderness.  Musculoskeletal: Normal range of motion.   Neurological: He is alert and oriented to person, place, and time.  Skin: Skin is warm and dry.  Psychiatric: He has a normal mood and affect. His behavior is normal. Judgment and thought content normal.     Assessment & Plan:  1. Hyperlipidemia, unspecified hyperlipidemia type - CBC; Future - Lipid Panel; Future - Thyroid Panel With TSH; Future  2. Cerebrovascular accident (CVA), unspecified mechanism (HCC) - Comprehensive metabolic panel; Future - Hemoglobin A1c; Future  3. Hypercholesteremia - atorvastatin (LIPITOR) 80 MG tablet; Take 1 tablet (80 mg total) by mouth daily at 6 PM.  Dispense: 34 tablet; Refill: 3 - aspirin EC 325 MG tablet; Take 1 tablet (325 mg total) by mouth daily.  Dispense: 100 tablet; Refill: 0  4. Essential hypertension - metoprolol (LOPRESSOR) 50 MG tablet; Take 1 tablet (50 mg total) by mouth 2 (two) times daily.  Dispense: 60 tablet; Refill: 3  Return for fasting labs on 07/29/2016. I will follow-up with you once labs result regarding next follow-up appointment.   Godfrey PickKimberly S. Tiburcio PeaHarris, MSN, [facility] [address] [place], [place] [address] [phone]

## 2016-07-29 ENCOUNTER — Other Ambulatory Visit: Payer: Self-pay | Admitting: Family Medicine

## 2016-07-29 ENCOUNTER — Other Ambulatory Visit (INDEPENDENT_AMBULATORY_CARE_PROVIDER_SITE_OTHER): Payer: Medicaid Other

## 2016-07-29 DIAGNOSIS — I639 Cerebral infarction, unspecified: Secondary | ICD-10-CM

## 2016-07-29 DIAGNOSIS — E785 Hyperlipidemia, unspecified: Secondary | ICD-10-CM | POA: Diagnosis not present

## 2016-07-29 LAB — COMPREHENSIVE METABOLIC PANEL
ALT: 222 U/L — ABNORMAL HIGH (ref 9–46)
AST: 63 U/L — AB (ref 10–35)
Albumin: 4.2 g/dL (ref 3.6–5.1)
Alkaline Phosphatase: 55 U/L (ref 40–115)
BILIRUBIN TOTAL: 0.5 mg/dL (ref 0.2–1.2)
BUN: 25 mg/dL (ref 7–25)
CO2: 28 mmol/L (ref 20–31)
Calcium: 9.1 mg/dL (ref 8.6–10.3)
Chloride: 106 mmol/L (ref 98–110)
Creat: 1.03 mg/dL (ref 0.70–1.33)
GLUCOSE: 76 mg/dL (ref 65–99)
Potassium: 4.3 mmol/L (ref 3.5–5.3)
SODIUM: 141 mmol/L (ref 135–146)
Total Protein: 6.2 g/dL (ref 6.1–8.1)

## 2016-07-29 LAB — CBC
HCT: 43.9 % (ref 38.5–50.0)
Hemoglobin: 14.4 g/dL (ref 13.2–17.1)
MCH: 29.3 pg (ref 27.0–33.0)
MCHC: 32.8 g/dL (ref 32.0–36.0)
MCV: 89.2 fL (ref 80.0–100.0)
MPV: 10.7 fL (ref 7.5–12.5)
PLATELETS: 228 10*3/uL (ref 140–400)
RBC: 4.92 MIL/uL (ref 4.20–5.80)
RDW: 13.8 % (ref 11.0–15.0)
WBC: 5.6 10*3/uL (ref 3.8–10.8)

## 2016-07-30 LAB — THYROID PANEL WITH TSH
FREE THYROXINE INDEX: 2.3 (ref 1.4–3.8)
T3 Uptake: 36 % — ABNORMAL HIGH (ref 22–35)
T4, Total: 6.4 ug/dL (ref 4.5–12.0)
TSH: 1.37 mIU/L (ref 0.40–4.50)

## 2016-07-30 LAB — HEMOGLOBIN A1C
Hgb A1c MFr Bld: 5.6 % (ref <5.7)
MEAN PLASMA GLUCOSE: 114 mg/dL

## 2016-07-31 ENCOUNTER — Other Ambulatory Visit: Payer: Self-pay | Admitting: Family Medicine

## 2016-07-31 DIAGNOSIS — R7401 Elevation of levels of liver transaminase levels: Secondary | ICD-10-CM

## 2016-07-31 DIAGNOSIS — R740 Nonspecific elevation of levels of transaminase and lactic acid dehydrogenase [LDH]: Principal | ICD-10-CM

## 2016-07-31 NOTE — Progress Notes (Unsigned)
Please call [facility] and request hepatic panel, amylase, lipase are added on to tubes collected Friday.

## 2016-08-01 ENCOUNTER — Telehealth: Payer: Self-pay | Admitting: Family Medicine

## 2016-08-01 ENCOUNTER — Telehealth: Payer: Self-pay

## 2016-08-01 ENCOUNTER — Encounter: Payer: Medicaid Other | Attending: Physical Medicine & Rehabilitation

## 2016-08-01 ENCOUNTER — Ambulatory Visit (HOSPITAL_BASED_OUTPATIENT_CLINIC_OR_DEPARTMENT_OTHER): Payer: Medicaid Other | Admitting: Physical Medicine & Rehabilitation

## 2016-08-01 ENCOUNTER — Encounter: Payer: Self-pay | Admitting: Physical Medicine & Rehabilitation

## 2016-08-01 VITALS — BP 155/101 | HR 54

## 2016-08-01 DIAGNOSIS — I10 Essential (primary) hypertension: Secondary | ICD-10-CM | POA: Insufficient documentation

## 2016-08-01 DIAGNOSIS — I69354 Hemiplegia and hemiparesis following cerebral infarction affecting left non-dominant side: Secondary | ICD-10-CM | POA: Insufficient documentation

## 2016-08-01 DIAGNOSIS — G8110 Spastic hemiplegia affecting unspecified side: Secondary | ICD-10-CM

## 2016-08-01 DIAGNOSIS — E785 Hyperlipidemia, unspecified: Secondary | ICD-10-CM | POA: Diagnosis not present

## 2016-08-01 DIAGNOSIS — Z5189 Encounter for other specified aftercare: Secondary | ICD-10-CM | POA: Diagnosis present

## 2016-08-01 LAB — LIPASE: Lipase: 30 U/L (ref 7–60)

## 2016-08-01 LAB — AMYLASE: Amylase: 70 U/L (ref 0–105)

## 2016-08-01 NOTE — Telephone Encounter (Signed)
Returned call to Brad Tran regarding blood pressure elevation. Advised to schedule an appointment to review blood pressure readings and possible medication changes if applicable.

## 2016-08-01 NOTE — Progress Notes (Signed)
Subjective:    Patient ID: Brad Tran, male    DOB: 02-16-1966, 51 y.o.   MRN: [id number]  HPI Botox injection 06/20/2016. Patient notes good results. Only complaint is difficulty with extending elbow. No excessive weakness in the hand  Discussed blood pressure today, states that he took his blood pressure medication this morning.  Biceps 100 units FDS 50 units FDP 50 units FPL 25 units FCR 25 units Brachial radialis, 25 units  Pain Inventory Average Pain 0 Pain Right Now 0 My pain is no pain  In the last 24 hours, has pain interfered with the following? General activity 0 Relation with others 0 Enjoyment of life 0 What TIME of day is your pain at its worst? no pain Sleep (in general) Fair  Pain is worse with: no pain Pain improves with: no pain Relief from Meds: 0  Mobility Do you have any goals in this area?  no  Function Do you have any goals in this area?  no  Neuro/Psych No problems in this area  Prior Studies Any changes since last visit?  no  Physicians involved in your care Any changes since last visit?  no   Family History  Problem Relation Age of Onset  . Heart attack Father   . Stroke Father    Social History   Social History  . Marital status: Divorced    Spouse name: N/A  . Number of children: N/A  . Years of education: N/A   Social History Main Topics  . Smoking status: Never Smoker  . Smokeless tobacco: Never Used  . Alcohol use No  . Drug use: No  . Sexual activity: Not Asked   Other Topics Concern  . None   Social History Narrative  . None   History reviewed. No pertinent surgical history. Past Medical History:  Diagnosis Date  . Hyperlipidemia   . Hypertension   . Stroke (HCC)    BP (!) 155/101 (BP Location: Left Arm, Patient Position: Sitting, Cuff Size: Normal)   Pulse (!) 54   SpO2 96%   Opioid Risk Score:   Fall Risk Score:  `1  Depression screen PHQ 2/9  Depression screen [facility] 2/9 07/11/2016 03/17/2016  02/26/2016 12/22/2015 11/23/2015  Decreased Interest 0 0 0 0 0  Down, Depressed, Hopeless 0 0 0 0 0  PHQ - 2 Score 0 0 0 0 0  Altered sleeping - - - - 0  Tired, decreased energy - - - - 1  Change in appetite - - - - 0  Feeling bad or failure about yourself  - - - - 0  Trouble concentrating - - - - 1  Moving slowly or fidgety/restless - - - - 1  Suicidal thoughts - - - - 0  PHQ-9 Score - - - - 3  Difficult doing work/chores - - - - Not difficult at all    Review of Systems  Constitutional: Negative.   HENT: Negative.   Eyes: Negative.   Respiratory: Negative.   Cardiovascular: Negative.   Gastrointestinal: Negative.   Endocrine: Negative.   Genitourinary: Negative.   Musculoskeletal: Positive for gait problem.  Skin: Negative.   Allergic/Immunologic: Negative.   Hematological: Negative.   Psychiatric/Behavioral: Negative.   All other systems reviewed and are negative.      Objective:   Physical Exam  Constitutional: He is oriented to person, place, and time. He appears well-developed and well-nourished.  HENT:  Head: Normocephalic and atraumatic.  Eyes: Conjunctivae and EOM  are normal. Pupils are equal, round, and reactive to light.  Musculoskeletal:       Right shoulder: He exhibits decreased range of motion and decreased strength.  Positive impingement sign, right shoulder, no pain with shoulder adduction. No pain with shoulder external rotation  Neurological: He is alert and oriented to person, place, and time. Coordination and gait abnormal.  Tone MAS 3 at the biceps MAS 2 at the wrist flexor, MAS 1, at the thumb flexor index and fifth digit flexor, MAS 2 at the third and fourth digit flexor  Motor strength 3 minus at the right deltoid, bicep, tricep finger flexor and extensor     Psychiatric: He has a normal mood and affect.  Nursing note and vitals reviewed.         Assessment & Plan:   1. Right spastic and hemi plegia, improved after Botox injection.  Minor adjustments, increase brachial radialis from 25 units, 250 units  Biceps 100 units FDS 50 units FDP 50 units FPL 25 units FCR 25 units Brachial radialis, 50 units  2. Right shoulder pain, likely impingement syndrome which is related to his motor weakness and muscle imbalance. We discussed corticosteroid injection, which she does not wish to pursue at the current time. Continue strengthening efforts  3. Hypertension systolics in the 150s , diastolic around 100, currently on metoprolol 50 twice a day, asked pt to call his primary about this

## 2016-08-01 NOTE — Telephone Encounter (Signed)
Left patient a voicemail to schedule an appointment for blood pressure re-evaluation

## 2016-08-01 NOTE — Progress Notes (Signed)
Hepatic panel could not be added because it has to be ran within 72 hours. Lipase and Amylase were added on.

## 2016-08-05 ENCOUNTER — Encounter: Payer: Self-pay | Admitting: Family Medicine

## 2016-08-05 ENCOUNTER — Ambulatory Visit (INDEPENDENT_AMBULATORY_CARE_PROVIDER_SITE_OTHER): Payer: Medicaid Other | Admitting: Family Medicine

## 2016-08-05 VITALS — BP 146/86 | HR 64 | Temp 98.8°F | Resp 16 | Ht 63.0 in

## 2016-08-05 DIAGNOSIS — I10 Essential (primary) hypertension: Secondary | ICD-10-CM

## 2016-08-05 MED ORDER — AMLODIPINE BESYLATE 2.5 MG PO TABS: 2.5000 mg | ORAL_TABLET | Freq: Every day | ORAL | 0 refills | Status: DC

## 2016-08-05 NOTE — Progress Notes (Signed)
Patient ID: Brad Tran, male    DOB: Aug 05, 1965, 51 y.o.   MRN: [id number]  PCP: Joaquin Courts, FNP  No chief complaint on file.   Subjective:  HPI  [name] is a 51 y.o. male presents for evaluation of blood pressure follow-up. 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 He has monitored at home and recently have had abnormal readings in 140's-160's/90-100. Denies any associated chest pain,  headaches, or dizziness. Concerned as his baseline blood pressures routinely are in 130's/80's.   Social History   Social History  . Marital status: Divorced    Spouse name: N/A  . Number of children: N/A  . Years of education: N/A   Occupational History  . Not on file.   Social History Main Topics  . Smoking status: Never Smoker  . Smokeless tobacco: Never Used  . Alcohol use No  . Drug use: No  . Sexual activity: Not on file   Other Topics Concern  . Not on file   Social History Narrative  . No narrative on file    Family History  Problem Relation Age of Onset  . Heart attack Father   . Stroke Father     Review of Systems See HPI Patient Active Problem List   Diagnosis Date Noted  . Subacromial impingement of right shoulder 02/26/2016  . Right hemiparesis (HCC)   . Myofascial muscle pain   . 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 Prediabetes   . Stroke (HCC) 10/25/2015  . CVA (cerebral infarction) 10/25/2015  . Stroke (cerebrum) (HCC) 10/25/2015  . Essential hypertension 10/25/2015  . Hyperlipidemia 10/25/2015  . Acute ischemic stroke (HCC)   . Cerebral infarction due to unspecified mechanism   . Dysarthria     No Known Allergies  Prior to Admission medications   Medication Sig Start Date End Date Taking? Authorizing Provider  aspirin  EC 325 MG tablet Take 1 tablet (325 mg total) by mouth daily. 07/11/16   Doyle Askew, FNP  atorvastatin (LIPITOR) 80 MG tablet Take 1 tablet (80 mg total) by mouth daily at 6 PM. 07/11/16   Doyle Askew, FNP  metoprolol (LOPRESSOR) 50 MG tablet Take 1 tablet (50 mg total) by mouth 2 (two) times daily. 07/11/16   Doyle Askew, FNP    Past Medical, Surgical Family and Social History reviewed and updated.    Objective:   Blood pressure (!) 146/86, pulse 64, temperature 98.8 F (37.1 C), temperature source Oral, resp. rate 16, height  (1.6 m), SpO2 98 %.  Wt Readings from Last 3 Encounters:  07/11/16 169 lb (76.7 kg)  06/15/16 164 lb (74.4 kg)  03/17/16 166 lb (75.3 kg)    Physical Exam  Constitutional: He is oriented to person, place, and time.  Cardiovascular: Normal rate, regular rhythm, normal heart sounds and intact distal pulses.   Pulmonary/Chest: Effort normal and breath sounds normal.  Neurological: He is alert and oriented to person, place, and time.  Skin: Skin is warm and dry.  Psychiatric: He has a normal mood and affect. His behavior is normal. Judgment and thought content normal.       Assessment & Plan:  1. Essential hypertension -Pt in today for a standard blood pressure recheck. Blood pressure remains elevated today. He is currently taking  metoprolol for hypertension management. Heart is rather low normal therefore will not increase dose of metoprolol. Will add amlodipine 2.5 mg and return in 1 week for a blood pressure recheck.   Godfrey Pick. Tiburcio Pea, MSN, [facility] [address] [place], [place] [address] [phone]

## 2016-08-07 NOTE — Addendum Note (Signed)
Addended by: Bing Neighbors on: 08/07/2016 02:22 PM   Modules accepted: Level of Service

## 2016-08-12 ENCOUNTER — Ambulatory Visit: Payer: Medicaid Other | Admitting: Family Medicine

## 2016-08-12 VITALS — BP 136/88

## 2016-08-12 DIAGNOSIS — Z0130 Encounter for examination of blood pressure without abnormal findings: Secondary | ICD-10-CM

## 2016-08-27 ENCOUNTER — Emergency Department (HOSPITAL_COMMUNITY)
Admission: EM | Admit: 2016-08-27 | Discharge: 2016-08-27 | Disposition: A | Discharge status: Home / Self Care | Payer: Medicaid Other | Attending: Emergency Medicine | Admitting: Emergency Medicine

## 2016-08-27 ENCOUNTER — Encounter (HOSPITAL_COMMUNITY): Payer: Self-pay

## 2016-08-27 DIAGNOSIS — Z79899 Other long term (current) drug therapy: Secondary | ICD-10-CM | POA: Insufficient documentation

## 2016-08-27 DIAGNOSIS — I10 Essential (primary) hypertension: Secondary | ICD-10-CM | POA: Diagnosis not present

## 2016-08-27 DIAGNOSIS — R1084 Generalized abdominal pain: Secondary | ICD-10-CM

## 2016-08-27 DIAGNOSIS — R7401 Elevation of levels of liver transaminase levels: Secondary | ICD-10-CM

## 2016-08-27 DIAGNOSIS — Z7982 Long term (current) use of aspirin: Secondary | ICD-10-CM | POA: Insufficient documentation

## 2016-08-27 DIAGNOSIS — Z8673 Personal history of transient ischemic attack (TIA), and cerebral infarction without residual deficits: Secondary | ICD-10-CM | POA: Insufficient documentation

## 2016-08-27 DIAGNOSIS — R740 Nonspecific elevation of levels of transaminase and lactic acid dehydrogenase [LDH]: Secondary | ICD-10-CM | POA: Diagnosis not present

## 2016-08-27 LAB — COMPREHENSIVE METABOLIC PANEL
ALK PHOS: 59 U/L (ref 38–126)
ALT: 118 U/L — AB (ref 17–63)
ANION GAP: 6 (ref 5–15)
AST: 38 U/L (ref 15–41)
Albumin: 4.5 g/dL (ref 3.5–5.0)
BILIRUBIN TOTAL: 0.5 mg/dL (ref 0.3–1.2)
BUN: 27 mg/dL — ABNORMAL HIGH (ref 6–20)
CALCIUM: 9.2 mg/dL (ref 8.9–10.3)
CO2: 28 mmol/L (ref 22–32)
CREATININE: 1.16 mg/dL (ref 0.61–1.24)
Chloride: 105 mmol/L (ref 101–111)
Glucose, Bld: 140 mg/dL — ABNORMAL HIGH (ref 65–99)
Potassium: 3.8 mmol/L (ref 3.5–5.1)
SODIUM: 139 mmol/L (ref 135–145)
TOTAL PROTEIN: 7.3 g/dL (ref 6.5–8.1)

## 2016-08-27 LAB — LIPASE, BLOOD: LIPASE: 26 U/L (ref 11–51)

## 2016-08-27 LAB — CBC
HCT: 44 % (ref 39.0–52.0)
HEMOGLOBIN: 15 g/dL (ref 13.0–17.0)
MCH: 29.9 pg (ref 26.0–34.0)
MCHC: 34.1 g/dL (ref 30.0–36.0)
MCV: 87.6 fL (ref 78.0–100.0)
PLATELETS: 205 10*3/uL (ref 150–400)
RBC: 5.02 MIL/uL (ref 4.22–5.81)
RDW: 12.8 % (ref 11.5–15.5)
WBC: 6.9 10*3/uL (ref 4.0–10.5)

## 2016-08-27 LAB — URINALYSIS, ROUTINE W REFLEX MICROSCOPIC
Bilirubin Urine: NEGATIVE
Glucose, UA: NEGATIVE mg/dL
Hgb urine dipstick: NEGATIVE
Ketones, ur: NEGATIVE mg/dL
Leukocytes, UA: NEGATIVE
NITRITE: NEGATIVE
PROTEIN: NEGATIVE mg/dL
SPECIFIC GRAVITY, URINE: 1.028 (ref 1.005–1.030)
pH: 5 (ref 5.0–8.0)

## 2016-08-27 NOTE — Discharge Instructions (Signed)
You can try alternating between tylenol and motrin as needed for pain. Try over the counter maalox/tums/zantac in order to try helping with symptoms. Stay well hydrated. Follow up with your regular doctor in 1 week for recheck of symptoms. Return to the ER for emergent changes or worsening symptoms.  Abdominal (belly) pain can be caused by many things. Your caregiver performed an examination and possibly ordered blood/urine tests and imaging (CT scan, x-rays, ultrasound). Many cases can be observed and treated at home after initial evaluation in the emergency department. Even though you are being discharged home, abdominal pain can be unpredictable. Therefore, you need a repeated exam if your pain does not resolve, returns, or worsens. Most patients with abdominal pain don't have to be admitted to the hospital or have surgery, but serious problems like appendicitis and gallbladder attacks can start out as nonspecific pain. Many abdominal conditions cannot be diagnosed in one visit, so follow-up evaluations are very important. SEEK IMMEDIATE MEDICAL ATTENTION IF YOU DEVELOP ANY OF THE FOLLOWING SYMPTOMS: The pain does not go away or becomes severe.  A temperature above 101 develops.  Repeated vomiting occurs (multiple episodes).  The pain becomes localized to portions of the abdomen. The right side could possibly be appendicitis. In an adult, the left lower portion of the abdomen could be colitis or diverticulitis.  Blood is being passed in stools or vomit (bright red or black tarry stools).  Return also if you develop chest pain, difficulty breathing, dizziness or fainting, or become confused, poorly responsive, or inconsolable (young children). The constipation stays for more than 4 days.  There is belly (abdominal) or rectal pain.  You do not seem to be getting better.

## 2016-08-27 NOTE — ED Notes (Signed)
Patient states he was having severe abdominal pain but now he feels better. Denies any pain, discomfort, nausea, or indigestion. States "I guess I am all better now. This happened before.:"

## 2016-08-27 NOTE — ED Triage Notes (Signed)
Pt reports 10/10 abd pain that started x3 hrs ago. Pt denies n/v and diarrhea.

## 2016-08-27 NOTE — ED Provider Notes (Signed)
[facility]-EMERGENCY DEPT Provider Note   CSN: [id number] Arrival date & time: 08/27/16  4098     History   Chief Complaint Chief Complaint  Patient presents with  . Abdominal Pain    HPI Francis Bleier is a 51 y.o. male with a PMHx of HTN, HLD, remote CVA with R hemiparesis, and prediabetes, who presents to the ED with complaints of generalized abdominal pain that occurred at midnight approximately 6 hours prior to evaluation in 3 hours prior to initial arrival, that resolved spontaneously 4.5 hours after onset which is about 1.5 hours before evaluation. States currently he has no pain at all. States this is the third time something like this has happened. Chart review reveals he was seen in the ED on 06/15/16 for a very similar episode, had labs that were basically unremarkable except for mildly elevated BUN/Cr, and was discharged because his symptoms spontaneously had resolved and no further work up was indicated. He followed up with his PCP last month and had labs that showed AST and ALT elevations however nothing was mentioned regarding what was done about this. He hasn't yet followed up again for reassessment after those labs. He describes his pain tonight as initially 10/10 but currently 0/10, generalized cramping and squeezing abdominal pain that is nonradiating and was constant until it resolved spontaneously, with no known aggravating factors, and unrelieved with a [place] powder remedy that he's been trying. No other treatments tried prior to arrival. Denies fevers, chills, CP, SOB, ongoing abd pain, nausea/vomiting, diarrhea/constipation, obstipation, melena, hematochezia, hematuria, dysuria, myalgias, arthralgias, numbness, tingling, focal weakness, or any other complaints at this time. Denies recent travel, sick contacts, suspicious food intake, EtOH use, NSAID use, or prior abd surgeries.    The history is provided by the patient and medical records. No language interpreter was used.  Abdominal  Pain   This is a recurrent problem. The current episode started 3 to 5 hours ago. The problem occurs constantly. The problem has been resolved. The pain is associated with an unknown factor. The pain is located in the generalized abdominal region. The quality of the pain is cramping. The pain is at a severity of 10/10 (now resolved and 0/10). The pain is severe (at onset; now resolved). Pertinent negatives include fever, diarrhea, flatus, hematochezia, melena, nausea, vomiting, constipation, dysuria, hematuria, arthralgias and myalgias. Nothing aggravates the symptoms. Nothing relieves the symptoms.    Past Medical History:  Diagnosis Date  . Hyperlipidemia   . Hypertension   . Stroke [facility])     Patient Active Problem List   Diagnosis Date Noted  . Subacromial impingement of right shoulder 02/26/2016  . Right hemiparesis (HCC)   . Myofascial muscle pain   . 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 Prediabetes   . Stroke (HCC) 10/25/2015  . CVA (cerebral infarction) 10/25/2015  . Stroke (cerebrum) (HCC) 10/25/2015  . Essential hypertension 10/25/2015  . Hyperlipidemia 10/25/2015  . Acute ischemic stroke (HCC)   . Cerebral infarction due to unspecified mechanism   . Dysarthria     History reviewed. No pertinent surgical history.     Home Medications    Prior to Admission medications   Medication Sig Start Date End Date Taking? Authorizing Provider  amLODipine (NORVASC) 2.5 MG tablet Take 1 tablet (2.5 mg total) by mouth daily. 08/05/16   Bing Neighbors, FNP  aspirin EC 325 MG tablet Take 1 tablet (325 mg total) by mouth daily. 07/11/16  Bing NeighborsHarris, Kimberly S, FNP  atorvastatin (LIPITOR) 80 MG tablet Take 1 tablet (80 mg total) by mouth daily at 6 PM. 07/11/16   Bing NeighborsHarris, Kimberly S, FNP  metoprolol (LOPRESSOR) 50 MG tablet Take 1 tablet (50 mg total) by mouth 2 (two) times daily. 07/11/16   Bing NeighborsHarris, Kimberly S, FNP    Family History Family  History  Problem Relation Age of Onset  . Heart attack Father   . Stroke Father     Social History Social History  Substance Use Topics  . Smoking status: Never Smoker  . Smokeless tobacco: Never Used  . Alcohol use No     Allergies   Patient has no known allergies.   Review of Systems Review of Systems  Constitutional: Negative for chills and fever.  Respiratory: Negative for shortness of breath.   Cardiovascular: Negative for chest pain.  Gastrointestinal: Positive for abdominal pain (now completely resolved). Negative for blood in stool, constipation, diarrhea, flatus, hematochezia, melena, nausea and vomiting.  Genitourinary: Negative for dysuria and hematuria.  Musculoskeletal: Negative for arthralgias and myalgias.  Skin: Negative for color change.  Allergic/Immunologic: Negative for immunocompromised state.  Neurological: Negative for weakness and numbness.  Psychiatric/Behavioral: Negative for confusion.   All other systems reviewed and are negative for acute change except as noted in the HPI.    Physical Exam Updated Vital Signs BP (!) 146/93 (BP Location: Left Arm)   Pulse 71   Temp 98 F (36.7 C) (Oral)   Resp 18   SpO2 100%   Physical Exam  Constitutional: He is oriented to person, place, and time. Vital signs are normal. He appears well-developed and well-nourished.  Non-toxic appearance. No distress.  Afebrile, nontoxic, NAD  HENT:  Head: Normocephalic and atraumatic.  Mouth/Throat: Oropharynx is clear and moist and mucous membranes are normal.  Eyes: Conjunctivae and EOM are normal. Right eye exhibits no discharge. Left eye exhibits no discharge.  Neck: Normal range of motion. Neck supple.  Cardiovascular: Normal rate, regular rhythm, normal heart sounds and intact distal pulses.  Exam reveals no gallop and no friction rub.   No murmur heard. Pulmonary/Chest: Effort normal and breath sounds normal. No respiratory distress. He has no decreased breath  sounds. He has no wheezes. He has no rhonchi. He has no rales.  Abdominal: Soft. Normal appearance and bowel sounds are normal. He exhibits no distension. There is no tenderness. There is no rigidity, no rebound, no guarding, no CVA tenderness, no tenderness at McBurney's point and negative Murphy's sign.  Soft, NTND, +BS throughout, no r/g/r, neg murphy's, neg mcburney's, no CVA TTP   Musculoskeletal: Normal range of motion.  Neurological: He is alert and oriented to person, place, and time. He has normal strength. No sensory deficit.  Skin: Skin is warm, dry and intact. No rash noted.  Psychiatric: He has a normal mood and affect.  Nursing note and vitals reviewed.    ED Treatments / Results  Labs (all labs ordered are listed, but only abnormal results are displayed) Labs Reviewed  COMPREHENSIVE METABOLIC PANEL - Abnormal; Notable for the following:       Result Value   Glucose, Bld 140 (*)    BUN 27 (*)    ALT 118 (*)    All other components within normal limits  LIPASE, BLOOD  CBC  URINALYSIS, ROUTINE W REFLEX MICROSCOPIC    EKG  EKG Interpretation None       Radiology No results found.  Procedures Procedures (including critical care time)  Medications Ordered in ED Medications - No data to display   Initial Impression / Assessment and Plan / ED Course  I have reviewed the triage vital signs and the nursing notes.  Pertinent labs & imaging results that were available during my care of the patient were reviewed by me and considered in my medical decision making (see chart for details).     51 y.o. male here with abd pain that happened at midnight and then resolved after 4.5hrs, currently entirely resolved on exam. States it's the third time this has happened, once before was on 06/15/16 and he came to ED for eval and had neg labs and pain resolved so they discharged him. He followed up with his PCP last month and they did labs that showed mild AST/ALT elevations  but no mention of what was done for this. Today his symptoms again resolved entirely on their own. On exam, no abdominal tenderness at all. Well appearing. Labs today unremarkable aside from ALT 111 which is half of what it was last month; AST normal. Given resolved symptoms, no abdominal tenderness, and reassuring labs, doubt need for further emergent work up at this time. Will have him try tylenol/motrin for pain, stay hydrated, consider tums/maalox/zantac, and f/up with PCP in 1wk for recheck. I explained the diagnosis and have given explicit precautions to return to the ER including for any other new or worsening symptoms. The patient understands and accepts the medical plan as it's been dictated and I have answered their questions. Discharge instructions concerning home care and prescriptions have been given. The patient is STABLE and is discharged to home in good condition.    Final Clinical Impressions(s) / ED Diagnoses   Final diagnoses:  Generalized abdominal pain  Elevated ALT measurement    New Prescriptions New Prescriptions   No medications on [address], [place], [place] 08/27/16 1610    Gilda Crease, MD 08/28/16 [phone]

## 2016-09-01 ENCOUNTER — Encounter: Payer: Self-pay | Admitting: Family Medicine

## 2016-09-01 ENCOUNTER — Ambulatory Visit (INDEPENDENT_AMBULATORY_CARE_PROVIDER_SITE_OTHER): Payer: Medicaid Other | Admitting: Family Medicine

## 2016-09-01 VITALS — BP 146/86 | HR 65 | Temp 97.9°F | Resp 16 | Ht 63.0 in | Wt 174.0 lb

## 2016-09-01 DIAGNOSIS — R109 Unspecified abdominal pain: Secondary | ICD-10-CM

## 2016-09-01 DIAGNOSIS — I10 Essential (primary) hypertension: Secondary | ICD-10-CM | POA: Diagnosis not present

## 2016-09-01 DIAGNOSIS — R12 Heartburn: Secondary | ICD-10-CM | POA: Diagnosis not present

## 2016-09-01 DIAGNOSIS — Z1211 Encounter for screening for malignant neoplasm of colon: Secondary | ICD-10-CM

## 2016-09-01 DIAGNOSIS — G8929 Other chronic pain: Secondary | ICD-10-CM | POA: Diagnosis not present

## 2016-09-01 MED ORDER — RANITIDINE HCL 150 MG PO TABS: 150.0000 mg | ORAL_TABLET | Freq: Two times a day (BID) | ORAL | 0 refills | Status: DC

## 2016-09-01 MED ORDER — AMLODIPINE BESYLATE 2.5 MG PO TABS: 2.5000 mg | ORAL_TABLET | Freq: Every day | ORAL | 1 refills | Status: DC

## 2016-09-01 NOTE — Progress Notes (Signed)
Patient ID: Brad Tran, male    DOB: 1965/11/09, 51 y.o.   MRN: [id number]  PCP: Bing Neighbors, FNP  Chief Complaint  Patient presents with  . Follow-up    medication  making blood pressure to low    Subjective:  HPI  Brad Tran is a 51 y.o. male presents for hypertension follow-up and evaluation of intermittent abdominal pain.  Hypertension  Brad Tran was originally prescribed metoprolol 50 mg twice daily for hypertension.  His blood pressure began to persistently range greater than 140/90, peaking at 150/100. During his last office visit 08/05/2016, his blood pressure was elevated and he brought in home readings  Indicating persistently elevated readings. He was prescribed Amlodipine 2.5 mg.  Today he denies any dizziness, headaches, or chest pain. He was concerned that his blood pressure was too "low in the 120's  And 130's". BP today 146/86.  Abdominal Pain Brad Tran has presented to the emergency department for complaints of abdominal pain twice within prior  3 months. He reports that pain most often occurs at nighttime and is localized to the  mid-epigastric region. Denies any associated burning of throat, coughing, or vomiting. He has occasional nausea when these episodes occur. He has not tried any over the counter anti-acid medications. He is never had a colonoscopy.   Social History   Social History  . Marital status: Divorced    Spouse name: N/A  . Number of children: N/A  . Years of education: N/A   Occupational History  . Not on file.   Social History Main Topics  . Smoking status: Never Smoker  . Smokeless tobacco: Never Used  . Alcohol use No  . Drug use: No  . Sexual activity: Not on file   Other Topics Concern  . Not on file   Social History Narrative  . No narrative on file    Family History  Problem Relation Age of Onset  . Heart attack Father   . Stroke Father    Review of Systems  See HPI  Patient Active Problem List   Diagnosis  Date Noted  . Subacromial impingement of right shoulder 02/26/2016  . Right hemiparesis (HCC)   . Myofascial muscle pain   . 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 Prediabetes   . Stroke (HCC) 10/25/2015  . CVA (cerebral infarction) 10/25/2015  . Stroke (cerebrum) (HCC) 10/25/2015  . Essential hypertension 10/25/2015  . Hyperlipidemia 10/25/2015  . Acute ischemic stroke (HCC)   . Cerebral infarction due to unspecified mechanism   . Dysarthria     No Known Allergies  Prior to Admission medications   Medication Sig Start Date End Date Taking? Authorizing Provider  amLODipine (NORVASC) 2.5 MG tablet Take 1 tablet (2.5 mg total) by mouth daily. 08/05/16  Yes Bing Neighbors, FNP  aspirin EC 325 MG tablet Take 1 tablet (325 mg total) by mouth daily. 07/11/16  Yes Bing Neighbors, FNP  atorvastatin (LIPITOR) 80 MG tablet Take 1 tablet (80 mg total) by mouth daily at 6 PM. 07/11/16  Yes Bing Neighbors, FNP  metoprolol (LOPRESSOR) 50 MG tablet Take 1 tablet (50 mg total) by mouth 2 (two) times daily. 07/11/16  Yes Bing Neighbors, FNP    Past Medical, Surgical Family and Social History reviewed and updated.    Objective:   Vitals:   09/01/16 1339  BP: (!) 146/86  Pulse: 65  Resp: 16  Temp: 97.9 F (36.6 C)  Wt Readings from Last 3 Encounters:  09/01/16 174 lb (78.9 kg)  07/11/16 169 lb (76.7 kg)  06/15/16 164 lb (74.4 kg)    Physical Exam  Constitutional: He is oriented to person, place, and time. He appears well-developed and well-nourished.  HENT:  Head: Normocephalic and atraumatic.  Right Ear: External ear normal.  Left Ear: External ear normal.  Nose: Nose normal.  Mouth/Throat: Oropharynx is clear and moist.  Eyes: Conjunctivae are normal. Pupils are equal, round, and reactive to light.  Neck: Normal range of motion. Neck supple.  Cardiovascular: Normal rate, regular rhythm and normal heart sounds.    Pulmonary/Chest: Effort normal and breath sounds normal.  Abdominal: Soft. Bowel sounds are normal. He exhibits no distension and no mass. There is no tenderness. There is no rebound and no guarding.  Musculoskeletal: Normal range of motion.  Neurological: He is alert and oriented to person, place, and time.  Skin: Skin is warm and dry.  Psychiatric: He has a normal mood and affect. His behavior is normal. Judgment and thought content normal.      Assessment & Plan:  1. Essential hypertension -Continue Metoprolol and Amlodipine as prescribed.  2. Heartburn -Ranitidine 150 mg twice daily as needed for abdominal pain and or heartburn.  3. Screen for colon cancer - Ambulatory referral to Gastroenterology  4. Chronic abdominal pain - Ambulatory referral to Gastroenterology   RTC: 3 months for chronic disease management   Godfrey PickKimberly S. Tiburcio PeaHarris, MSN, FNP-C [facility]  [address] [place], [place] [address] [phone]

## 2016-09-01 NOTE — Patient Instructions (Addendum)
Take ranitidine 150 mg at the onset of abdominal pain.  If pain persists, let me know via phone and I will refer you to gastroenterology.  You are overdue for your colonoscopy. I am including information below regarding this study.  Continue blood pressure medications as prescribed.  Please return in 3 months for office visit fasting for labs.     Colonoscopy, Adult A colonoscopy is an exam to look at the entire large intestine. During the exam, a lubricated, bendable tube is inserted into the anus and then passed into the rectum, colon, and other parts of the large intestine. A colonoscopy is often done as a part of normal colorectal screening or in response to certain symptoms, such as anemia, persistent diarrhea, abdominal pain, and blood in the stool. The exam can help screen for and diagnose medical problems, including:  Tumors.  Polyps.  Inflammation.  Areas of bleeding. Tell a health care provider about:  Any allergies you have.  All medicines you are taking, including vitamins, herbs, eye drops, creams, and over-the-counter medicines.  Any problems you or family members have had with anesthetic medicines.  Any blood disorders you have.  Any surgeries you have had.  Any medical conditions you have.  Any problems you have had passing stool. What are the risks? Generally, this is a safe procedure. However, problems may occur, including:  Bleeding.  A tear in the intestine.  A reaction to medicines given during the exam.  Infection (rare). What happens before the procedure? Eating and drinking restrictions  Follow instructions from your health care provider about eating and drinking, which may include:  A few days before the procedure - follow a low-fiber diet. Avoid nuts, seeds, dried fruit, raw fruits, and vegetables.  1-3 days before the procedure - follow a clear liquid diet. Drink only clear liquids, such as clear broth or bouillon, black coffee or tea,  clear juice, clear soft drinks or sports drinks, gelatin dessert, and popsicles. Avoid any liquids that contain red or purple dye.  On the day of the procedure - do not eat or drink anything during the 2 hours before the procedure, or within the time period that your health care provider recommends. Bowel prep  If you were prescribed an oral bowel prep to clean out your colon:  Take it as told by your health care provider. Starting the day before your procedure, you will need to drink a large amount of medicated liquid. The liquid will cause you to have multiple loose stools until your stool is almost clear or light green.  If your skin or anus gets irritated from diarrhea, you may use these to relieve the irritation:  Medicated wipes, such as adult wet wipes with aloe and vitamin E.  A skin soothing-product like petroleum jelly.  If you vomit while drinking the bowel prep, take a break for up to 60 minutes and then begin the bowel prep again. If vomiting continues and you cannot take the bowel prep without vomiting, call your health care provider. General instructions   Ask your health care provider about changing or stopping your regular medicines. This is especially important if you are taking diabetes medicines or blood thinners.  Plan to have someone take you home from the hospital or clinic. What happens during the procedure?  An IV tube may be inserted into one of your veins.  You will be given medicine to help you relax (sedative).  To reduce your risk of infection:  Your health  care team will wash or sanitize their hands.  Your anal area will be washed with soap.  You will be asked to lie on your side with your knees bent.  Your health care provider will lubricate a long, thin, flexible tube. The tube will have a camera and a light on the end.  The tube will be inserted into your anus.  The tube will be gently eased through your rectum and colon.  Air will be  delivered into your colon to keep it open. You may feel some pressure or cramping.  The camera will be used to take images during the procedure.  A small tissue sample may be removed from your body to be examined under a microscope (biopsy). If any potential problems are found, the tissue will be sent to a lab for testing.  If small polyps are found, your health care provider may remove them and have them checked for cancer cells.  The tube that was inserted into your anus will be slowly removed. The procedure may vary among health care providers and hospitals. What happens after the procedure?  Your blood pressure, heart rate, breathing rate, and blood oxygen level will be monitored until the medicines you were given have worn off.  Do not drive for 24 hours after the exam.  You may have a small amount of blood in your stool.  You may pass gas and have mild abdominal cramping or bloating due to the air that was used to inflate your colon during the exam.  It is up to you to get the results of your procedure. Ask your health care provider, or the department performing the procedure, when your results will be ready. This information is not intended to replace advice given to you by your health care provider. Make sure you discuss any questions you have with your health care provider. Document Released: 04/01/2000 Document Revised: 02/03/2016 Document Reviewed: 06/16/2015 Elsevier Interactive Patient Education  2017 [employer]vier Inc.  Gastroesophageal Reflux Disease, Adult Normally, food travels down the esophagus and stays in the stomach to be digested. If a person has gastroesophageal reflux disease (GERD), food and stomach acid move back up into the esophagus. When this happens, the esophagus becomes sore and swollen (inflamed). Over time, GERD can make small holes (ulcers) in the lining of the esophagus. Follow these instructions at home: Diet   Follow a diet as told by your doctor. You  may need to avoid foods and drinks such as:  Coffee and tea (with or without caffeine).  Drinks that contain alcohol.  Energy drinks and sports drinks.  Carbonated drinks or sodas.  Chocolate and cocoa.  Peppermint and mint flavorings.  Garlic and onions.  Horseradish.  Spicy and acidic foods, such as peppers, chili powder, curry powder, vinegar, hot sauces, and BBQ sauce.  Citrus fruit juices and citrus fruits, such as oranges, lemons, and limes.  Tomato-based foods, such as red sauce, chili, salsa, and pizza with red sauce.  Fried and fatty foods, such as donuts, french fries, potato chips, and high-fat dressings.  High-fat meats, such as hot dogs, rib eye steak, sausage, ham, and bacon.  High-fat dairy items, such as whole milk, butter, and cream cheese.  Eat small meals often. Avoid eating large meals.  Avoid drinking large amounts of liquid with your meals.  Avoid eating meals during the 2-3 hours before bedtime.  Avoid lying down right after you eat.  Do not exercise right after you eat. General instructions  Pay attention to any changes in your symptoms.  Take over-the-counter and prescription medicines only as told by your doctor. Do not take aspirin, ibuprofen, or other NSAIDs unless your doctor says it is okay.  Do not use any tobacco products, including cigarettes, chewing tobacco, and e-cigarettes. If you need help quitting, ask your doctor.  Wear loose clothes. Do not wear anything tight around your waist.  Raise (elevate) the head of your bed about 6 inches (15 cm).  Try to lower your stress. If you need help doing this, ask your doctor.  If you are overweight, lose an amount of weight that is healthy for you. Ask your doctor about a safe weight loss goal.  Keep all follow-up visits as told by your doctor. This is important. Contact a doctor if:  You have new symptoms.  You lose weight and you do not know why it is happening.  You have  trouble swallowing, or it hurts to swallow.  You have wheezing or a cough that keeps happening.  Your symptoms do not get better with treatment.  You have a hoarse voice. Get help right away if:  You have pain in your arms, neck, jaw, teeth, or back.  You feel sweaty, dizzy, or light-headed.  You have chest pain or shortness of breath.  You throw up (vomit) and your throw up looks like blood or coffee grounds.  You pass out (faint).  Your poop (stool) is bloody or black.  You cannot swallow, drink, or eat. This information is not intended to replace advice given to you by your health care provider. Make sure you discuss any questions you have with your health care provider. Document Released: 09/21/2007 Document Revised: 09/10/2015 Document Reviewed: 07/30/2014 Elsevier Interactive Patient Education  2017 [employer]

## 2016-09-16 ENCOUNTER — Ambulatory Visit: Payer: Medicaid Other | Admitting: Physical Medicine & Rehabilitation

## 2016-09-22 ENCOUNTER — Encounter: Payer: Self-pay | Admitting: Physical Medicine & Rehabilitation

## 2016-09-22 ENCOUNTER — Ambulatory Visit (HOSPITAL_BASED_OUTPATIENT_CLINIC_OR_DEPARTMENT_OTHER): Payer: Medicaid Other | Admitting: Physical Medicine & Rehabilitation

## 2016-09-22 ENCOUNTER — Encounter: Payer: Medicaid Other | Attending: Physical Medicine & Rehabilitation

## 2016-09-22 VITALS — BP 153/87 | HR 61 | Resp 14

## 2016-09-22 DIAGNOSIS — I69354 Hemiplegia and hemiparesis following cerebral infarction affecting left non-dominant side: Secondary | ICD-10-CM | POA: Diagnosis not present

## 2016-09-22 DIAGNOSIS — I10 Essential (primary) hypertension: Secondary | ICD-10-CM | POA: Diagnosis not present

## 2016-09-22 DIAGNOSIS — G8110 Spastic hemiplegia affecting unspecified side: Secondary | ICD-10-CM | POA: Diagnosis not present

## 2016-09-22 DIAGNOSIS — E785 Hyperlipidemia, unspecified: Secondary | ICD-10-CM | POA: Diagnosis not present

## 2016-09-22 DIAGNOSIS — Z5189 Encounter for other specified aftercare: Secondary | ICD-10-CM | POA: Insufficient documentation

## 2016-09-22 NOTE — Progress Notes (Signed)
Botox Injection for spasticity using needle EMG guidance  Dilution: 50 Units/ml Indication: Severe spasticity which interferes with ADL,mobility and/or  hygiene and is unresponsive to medication management and other conservative care Informed consent was obtained after describing risks and benefits of the procedure with the patient. This includes bleeding, bruising, infection, excessive weakness, or medication side effects. A REMS form is on file and signed. Needle: 27g 1" needle electrode Number of units per muscle Biceps 125 units FDS 25 units FDP 50 units FPL 25 units FCR 25 units Brachial radialis, 50 units All injections were done after obtaining appropriate EMG activity, or e stim eliciting Index flexion in case of FDP and FDS  and after negative drawback for blood. The patient tolerated the procedure well. Post procedure instructions were given. A followup appointment was made.

## 2016-09-22 NOTE — Patient Instructions (Signed)

## 2016-09-23 ENCOUNTER — Telehealth: Payer: Self-pay

## 2016-09-26 NOTE — Telephone Encounter (Signed)
Left vm with information to [facility]-[phone]

## 2016-10-02 ENCOUNTER — Other Ambulatory Visit: Payer: Self-pay | Admitting: Family Medicine

## 2016-10-03 ENCOUNTER — Encounter: Payer: Self-pay | Admitting: Gastroenterology

## 2016-10-13 DIAGNOSIS — I10 Essential (primary) hypertension: Secondary | ICD-10-CM | POA: Insufficient documentation

## 2016-10-20 ENCOUNTER — Other Ambulatory Visit: Payer: Self-pay | Admitting: Family Medicine

## 2016-10-20 DIAGNOSIS — E7800 Pure hypercholesterolemia, unspecified: Secondary | ICD-10-CM

## 2016-10-21 ENCOUNTER — Telehealth: Payer: Self-pay | Admitting: *Deleted

## 2016-10-21 NOTE — Telephone Encounter (Signed)
No show PV today. Called patient. No answer, left message for patient to call us back today before 5 pm.

## 2016-10-24 ENCOUNTER — Encounter: Payer: Self-pay | Admitting: Family Medicine

## 2016-11-03 ENCOUNTER — Encounter: Payer: Medicaid Other | Attending: Physical Medicine & Rehabilitation

## 2016-11-03 ENCOUNTER — Ambulatory Visit: Payer: Medicaid Other | Admitting: Physical Medicine & Rehabilitation

## 2016-11-03 DIAGNOSIS — I10 Essential (primary) hypertension: Secondary | ICD-10-CM | POA: Insufficient documentation

## 2016-11-03 DIAGNOSIS — Z5189 Encounter for other specified aftercare: Secondary | ICD-10-CM | POA: Insufficient documentation

## 2016-11-03 DIAGNOSIS — E785 Hyperlipidemia, unspecified: Secondary | ICD-10-CM | POA: Insufficient documentation

## 2016-11-03 DIAGNOSIS — I69354 Hemiplegia and hemiparesis following cerebral infarction affecting left non-dominant side: Secondary | ICD-10-CM | POA: Insufficient documentation

## 2016-11-04 ENCOUNTER — Encounter: Payer: Medicaid Other | Admitting: Gastroenterology

## 2016-11-07 ENCOUNTER — Other Ambulatory Visit: Payer: Self-pay | Admitting: Family Medicine

## 2016-11-16 ENCOUNTER — Encounter: Payer: Self-pay | Admitting: Rehabilitative and Restorative Service Providers"

## 2016-11-16 NOTE — Therapy (Unsigned)
[facility] [address] [place], [place], [address] Phone: [phone]   Fax:  [phone]  Patient Details  Name: Brad Tran MRN: [id number] Date of Birth: 11/01/1965 Referring Provider:  Andrew Kirsteins, MD  Encounter Date: last encounter 03/03/16  PHYSICAL THERAPY DISCHARGE SUMMARY  Visits from Start of Care: 8  Current functional level related to goals / functional outcomes:     PT Short Term Goals - 02/01/16 1239      PT SHORT TERM GOAL #1   Title =LTG's due to new medicaid approval for 8 visits through 03/10/16         PT Long Term Goals - 03/03/16 1126      PT LONG TERM GOAL #1   Title Pt will be independent with HEP in order to indicate improved functional mobility and decreased fall risk.  (Target date: 03/10/16-just got approval from Medicaid during visit on 02/01/16)   Baseline has 2 exercises, max verbal cues    Time 4   Period Weeks   Status On-going     PT LONG TERM GOAL #2   Title Pt will improve BERG balance test by 8 points from baseline in order to indicate decreased fall risk.    Baseline 46/56 on 02/01/16 and improved to 51/56 on 03/03/2016   Time 4   Period Weeks   Status Partially Met     PT LONG TERM GOAL #3   Title Pt will improve gait speed to >2.62 ft/sec w/ LRAD in order to indicate pt is community ambulator.     Baseline 1.68 ft/sec on 12/07/15   Time 4   Period Weeks   Status On-going     PT LONG TERM GOAL #4   Title Pt will ambulate over varying outdoor surfaces (including grass, ramp, curb) x 500' w/ LRAD at mod I level in order to indicate safe return to community and leisure activities.     Baseline S over indoor surfaces with LBQC and R AFO   Time 4   Period Weeks   Status On-going     PT LONG TERM GOAL #5   Title Pt will perform floor transfer at mod I level in order to indicate safe fall recovery.     Baseline Met on 03/03/2016 with intermittent help with L UE  on chair.   Time 4   Period Weeks   Status Achieved     PT LONG TERM GOAL #6   Title Pt will ambulate without AD, with brace up to 500' over indoor surfaces at mod I level in order to indicate improved independence with gait at home.    Baseline S to min/guard without brace on 02/01/16   Time 4   Period Weeks   Status On-going     Patient did not return for last visit with PT.  See above for goal status at time of d/c.   Remaining deficits: See goal status for patient's functional abilities.   Education / Equipment: HEP.  Plan: Patient agrees to discharge.  Patient goals were partially met. Patient is being discharged due to not returning since the last visit.  ?????                 Thank you for the referral of this patient.  , MPT   , 11/16/2016, 9:49 AM  [facility] [address] [place], [place], [address] Phone: [phone]   Fax:  [phone] 

## 2016-11-23 ENCOUNTER — Other Ambulatory Visit: Payer: Self-pay | Admitting: Family Medicine

## 2016-11-23 DIAGNOSIS — I10 Essential (primary) hypertension: Secondary | ICD-10-CM

## 2016-12-02 ENCOUNTER — Ambulatory Visit: Payer: Medicaid Other | Admitting: Family Medicine

## 2016-12-03 ENCOUNTER — Other Ambulatory Visit: Payer: Self-pay | Admitting: Family Medicine

## 2016-12-03 DIAGNOSIS — E7800 Pure hypercholesterolemia, unspecified: Secondary | ICD-10-CM

## 2016-12-05 ENCOUNTER — Ambulatory Visit (INDEPENDENT_AMBULATORY_CARE_PROVIDER_SITE_OTHER): Payer: Self-pay | Admitting: Family Medicine

## 2016-12-05 ENCOUNTER — Encounter: Payer: Self-pay | Admitting: Family Medicine

## 2016-12-05 VITALS — BP 130/88 | HR 72 | Temp 98.0°F | Resp 14 | Ht 63.0 in | Wt 166.6 lb

## 2016-12-05 DIAGNOSIS — E7800 Pure hypercholesterolemia, unspecified: Secondary | ICD-10-CM

## 2016-12-05 DIAGNOSIS — K219 Gastro-esophageal reflux disease without esophagitis: Secondary | ICD-10-CM

## 2016-12-05 DIAGNOSIS — I10 Essential (primary) hypertension: Secondary | ICD-10-CM

## 2016-12-05 NOTE — Progress Notes (Signed)
Patient ID: Brad Tran, male    DOB: [birth year], 51 y.o.   MRN: [id number]  PCP: Bing Neighbors, FNP  Chief Complaint  Patient presents with  . Follow-up    3 MONTH    Subjective:  HPI Brad Tran is a 51 y.o. male presents for evaluation of hypertension and GERD. Medical problems include: CVA, hypertension, right sides hemiparesis, and hyperlipidemia.  Hypertension  Brad Tran reports consistent home monitoring of blood pressure. He is currently prescribed metoprolol and amlodipine for blood pressure management. However, he took his last dose of amlodipine today and would like to refrain from taking an monitor his blood pressure while continuing metoprolol only. He reports that he continues to monitor blood pressure at home and readings have been less than 140/90. He has lost weight as he has implemented a regular exercise regimen consisting of 1-2 hours of daily exercise. Reports efforts to adhere to low sodium diet. He is a nonsmoker. Denies any episodes of dizziness, headaches, shortness of breath, or chest pain.  GERD Reports acid reflux symptoms remain controlled with ranitidine. Brad Tran reports that symptoms of abdominal discomfort and nausea redevelop if he misses even one dose of medication.  He was referred for a colonoscopy due to chronicity of his abdominal symptoms and overdue status for colon cancer screening.  Brad Tran no showed appointment scheduled with [facility] 10/21/2016. He plans to reschedule.   Social History   Social History  . Marital status: Divorced    Spouse name: N/A  . Number of children: N/A  . Years of education: N/A   Occupational History  . Not on file.   Social History Main Topics  . Smoking status: Never Smoker  . Smokeless tobacco: Never Used  . Alcohol use No  . Drug use: No  . Sexual activity: Not on file   Other Topics Concern  . Not on file   Social History Narrative  . No narrative on file    Family History  Problem Relation Age  of Onset  . Heart attack Father   . Stroke Father    Review of Systems See history of present illness  Patient Active Problem List   Diagnosis Date Noted  . Hypertension   . Subacromial impingement of right shoulder 02/26/2016  . Right hemiparesis (HCC)   . Myofascial muscle pain   . 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 Prediabetes   . Stroke (HCC) 10/25/2015  . CVA (cerebral infarction) 10/25/2015  . Stroke (cerebrum) (HCC) 10/25/2015  . Essential hypertension 10/25/2015  . Hyperlipidemia 10/25/2015  . Acute ischemic stroke (HCC)   . Cerebral infarction due to unspecified mechanism   . Dysarthria     No Known Allergies  Prior to Admission medications   Medication Sig Start Date End Date Taking? Authorizing Provider  amLODipine (NORVASC) 2.5 MG tablet Take 1 tablet (2.5 mg total) by mouth daily. 09/01/16  Yes Bing Neighbors, FNP  atorvastatin (LIPITOR) 80 MG tablet TAKE 1 TABLET BY MOUTH EVERY DAY AT 6PM 12/05/16  Yes Bing Neighbors, FNP  [facility] ASPIRIN EC 325 MG EC tablet TAKE 1 TABLET BY MOUTH EVERY DAY 10/20/16  Yes Bing Neighbors, FNP  metoprolol tartrate (LOPRESSOR) 50 MG tablet TAKE 1 TABLET BY MOUTH TWICE A DAY 11/23/16  Yes Bing Neighbors, FNP  ranitidine (ZANTAC) 150 MG tablet TAKE 1 TABLET BY MOUTH TWICE A DAY 11/07/16  Yes Bing Neighbors, FNP  Past Medical, Surgical Family and Social History reviewed and updated.    Objective:   Today's Vitals   12/05/16 1440  BP: 130/88  Pulse: 72  Resp: 14  Temp: 98 F (36.7 C)  TempSrc: Oral  SpO2: 99%  Weight: 166 lb 9.6 oz (75.6 kg)  Height: 5\' 3"  (1.6 m)    Wt Readings from Last 3 Encounters:  12/05/16 166 lb 9.6 oz (75.6 kg)  09/01/16 174 lb (78.9 kg)  07/11/16 169 lb (76.7 kg)   Physical Exam  Constitutional: He is oriented to person, place, and time. He appears well-developed and well-nourished.  HENT:  Head: Normocephalic and atraumatic.  Eyes:  Pupils are equal, round, and reactive to light. Conjunctivae are normal.  Neck: Normal range of motion.  Cardiovascular: Normal rate, regular rhythm, normal heart sounds and intact distal pulses.   Pulmonary/Chest: Effort normal and breath sounds normal.  Lymphadenopathy:    He has no cervical adenopathy.  Neurological: He is alert and oriented to person, place, and time.  Chronic right sided hemiparesis  Skin: Skin is warm and dry.  Psychiatric: He has a normal mood and affect. His behavior is normal. Judgment and thought content normal.   Assessment & Plan:  1. Essential hypertension, well controlled today. -Continue metoprolol 50 mg, twice daily. Recommended patient continue amlodipine 2.5 mg daily however he declined. Patient will continue to monitor blood pressure and notify me via phone if blood pressure readings exceed 150/90.  2. Hypercholesteremia - Lipid panel, fasting. Patient will return for fasting labs at his convenience.  3. Gastroesophageal reflux disease without esophagitis -Reschedule appointment for colonoscopy with [facility] at your earliest convenience. -Continue ranitidine 150 mg twice daily.  RTC: 6 months chronic disease management   Godfrey Pick. Tiburcio Pea, MSN, FNP-C [facility]  [address] [place], [place] [address] [phone]

## 2016-12-20 ENCOUNTER — Other Ambulatory Visit: Payer: Self-pay | Admitting: Family Medicine

## 2016-12-20 DIAGNOSIS — I10 Essential (primary) hypertension: Secondary | ICD-10-CM

## 2017-01-18 ENCOUNTER — Other Ambulatory Visit: Payer: Self-pay | Admitting: Family Medicine

## 2017-01-18 ENCOUNTER — Other Ambulatory Visit: Payer: Medicaid Other

## 2017-01-18 DIAGNOSIS — I10 Essential (primary) hypertension: Secondary | ICD-10-CM

## 2017-01-22 ENCOUNTER — Other Ambulatory Visit: Payer: Self-pay | Admitting: Family Medicine

## 2017-01-22 DIAGNOSIS — E7800 Pure hypercholesterolemia, unspecified: Secondary | ICD-10-CM

## 2017-02-19 ENCOUNTER — Other Ambulatory Visit: Payer: Self-pay | Admitting: Family Medicine

## 2017-02-19 DIAGNOSIS — I10 Essential (primary) hypertension: Secondary | ICD-10-CM

## 2017-02-27 ENCOUNTER — Other Ambulatory Visit: Payer: Self-pay | Admitting: Family Medicine

## 2017-02-27 DIAGNOSIS — E7800 Pure hypercholesterolemia, unspecified: Secondary | ICD-10-CM

## 2017-03-23 ENCOUNTER — Other Ambulatory Visit: Payer: Self-pay | Admitting: Family Medicine

## 2017-03-23 DIAGNOSIS — I10 Essential (primary) hypertension: Secondary | ICD-10-CM

## 2017-04-25 ENCOUNTER — Other Ambulatory Visit: Payer: Self-pay | Admitting: Family Medicine

## 2017-04-25 DIAGNOSIS — I10 Essential (primary) hypertension: Secondary | ICD-10-CM

## 2017-04-25 MED ORDER — METOPROLOL TARTRATE 50 MG PO TABS: 50.0000 mg | ORAL_TABLET | Freq: Two times a day (BID) | ORAL | 0 refills | Status: DC

## 2017-04-25 NOTE — Addendum Note (Signed)
Addended by: Bing NeighborsHARRIS, Lillyanne Bradburn S on: 04/25/2017 08:42 AM   Modules accepted: Orders

## 2017-05-30 ENCOUNTER — Other Ambulatory Visit: Payer: Self-pay | Admitting: Family Medicine

## 2017-05-30 DIAGNOSIS — I10 Essential (primary) hypertension: Secondary | ICD-10-CM

## 2017-06-07 ENCOUNTER — Ambulatory Visit: Payer: Medicaid Other | Admitting: Family Medicine

## 2017-09-01 ENCOUNTER — Other Ambulatory Visit: Payer: Self-pay | Admitting: Family Medicine

## 2017-09-01 DIAGNOSIS — I10 Essential (primary) hypertension: Secondary | ICD-10-CM

## 2017-09-05 ENCOUNTER — Other Ambulatory Visit: Payer: Self-pay | Admitting: Family Medicine

## 2017-09-05 DIAGNOSIS — I10 Essential (primary) hypertension: Secondary | ICD-10-CM

## 2017-09-23 ENCOUNTER — Other Ambulatory Visit: Payer: Self-pay | Admitting: Internal Medicine

## 2017-09-23 DIAGNOSIS — I10 Essential (primary) hypertension: Secondary | ICD-10-CM

## 2017-09-29 ENCOUNTER — Other Ambulatory Visit: Payer: Self-pay | Admitting: Internal Medicine

## 2017-09-29 DIAGNOSIS — I10 Essential (primary) hypertension: Secondary | ICD-10-CM

## 2017-10-03 ENCOUNTER — Other Ambulatory Visit: Payer: Self-pay | Admitting: Internal Medicine

## 2017-10-03 DIAGNOSIS — I10 Essential (primary) hypertension: Secondary | ICD-10-CM

## 2017-10-09 ENCOUNTER — Telehealth: Payer: Self-pay

## 2017-10-10 ENCOUNTER — Ambulatory Visit: Payer: Self-pay | Admitting: Family Medicine

## 2017-10-12 NOTE — Telephone Encounter (Signed)
Note not needed 

## 2017-10-30 ENCOUNTER — Encounter: Payer: Self-pay | Admitting: Family Medicine

## 2017-10-30 ENCOUNTER — Ambulatory Visit (INDEPENDENT_AMBULATORY_CARE_PROVIDER_SITE_OTHER): Payer: Self-pay | Admitting: Family Medicine

## 2017-10-30 VITALS — Ht 63.0 in | Wt 171.0 lb

## 2017-10-30 DIAGNOSIS — Z09 Encounter for follow-up examination after completed treatment for conditions other than malignant neoplasm: Secondary | ICD-10-CM

## 2017-10-30 DIAGNOSIS — Z131 Encounter for screening for diabetes mellitus: Secondary | ICD-10-CM

## 2017-10-30 DIAGNOSIS — R8290 Unspecified abnormal findings in urine: Secondary | ICD-10-CM

## 2017-10-30 DIAGNOSIS — I10 Essential (primary) hypertension: Secondary | ICD-10-CM

## 2017-10-30 DIAGNOSIS — Z8673 Personal history of transient ischemic attack (TIA), and cerebral infarction without residual deficits: Secondary | ICD-10-CM

## 2017-10-30 LAB — POCT URINALYSIS DIP (MANUAL ENTRY)
Bilirubin, UA: NEGATIVE
Glucose, UA: NEGATIVE mg/dL
Ketones, POC UA: NEGATIVE mg/dL
Leukocytes, UA: NEGATIVE
Nitrite, UA: NEGATIVE
Protein Ur, POC: NEGATIVE mg/dL
Spec Grav, UA: 1.025 (ref 1.010–1.025)
Urobilinogen, UA: 0.2 E.U./dL
pH, UA: 5.5 (ref 5.0–8.0)

## 2017-10-30 LAB — POCT GLYCOSYLATED HEMOGLOBIN (HGB A1C): Hemoglobin A1C: 5.6 % (ref 4.0–5.6)

## 2017-10-30 MED ORDER — AMLODIPINE BESYLATE 2.5 MG PO TABS: 2.5000 mg | ORAL_TABLET | Freq: Every day | ORAL | 1 refills | Status: DC

## 2017-10-30 NOTE — Progress Notes (Signed)
Subjective:    Patient ID: Brad Tran, male    DOB: 03/03/1966, 52 y.o.   MRN: [id number]   PCP: Raliegh IpNatalie Desmin Daleo, NP  Chief Complaint  Patient presents with  . Follow-up    HTN    HPI   [name] has a past medical history of Stroke, Hypertension, and Hyperlipidemia. He is here today for follow up.   Current Status: Since his last office visit, he is doing well with no complaints. He is s/p: Stroke on 10/2014. He continues to have residual right extremity weakness. He has had extensive PT and OT and states that he is doing much better. He states that he is not taking Norvasc for blood pressure as prescribed. He states that he sleeps more since having stroke.   He denies fevers, chills, fatigue, recent infections, weight loss, and night sweats.   He has not had any headaches, dizziness, and falls. He has visual problems, but has recently gotten new glasses.   No chest pain, heart palpitations, cough and shortness of breath reported. No reports of GI problems such as nausea, vomiting, diarrhea, and constipation. He has no reports of blood in stools, dysuria and hematuria.   No depression or anxiety.  He denies pain today.   Past Medical History:  Diagnosis Date  . Hyperlipidemia   . Hypertension   . Stroke [facility](HCC)     Family History  Problem Relation Age of Onset  . Heart attack Father   . Stroke Father     Social History   Socioeconomic History  . Marital status: Divorced    Spouse name: Not on file  . Number of children: Not on file  . Years of education: Not on file  . Highest education level: Not on file  Occupational History  . Not on file  Social Needs  . Financial resource strain: Not on file  . Food insecurity:    Worry: Not on file    Inability: Not on file  . Transportation needs:    Medical: Not on file    Non-medical: Not on file  Tobacco Use  . Smoking status: Never Smoker  . Smokeless tobacco: Never Used  Substance and Sexual Activity  . Alcohol  use: No  . Drug use: No  . Sexual activity: Not on file  Lifestyle  . Physical activity:    Days per week: Not on file    Minutes per session: Not on file  . Stress: Not on file  Relationships  . Social connections:    Talks on phone: Not on file    Gets together: Not on file    Attends religious service: Not on file    Active member of club or organization: Not on file    Attends meetings of clubs or organizations: Not on file    Relationship status: Not on file  . Intimate partner violence:    Fear of current or ex partner: Not on file    Emotionally abused: Not on file    Physically abused: Not on file    Forced sexual activity: Not on file  Other Topics Concern  . Not on file  Social History Narrative  . Not on file    History reviewed. No pertinent surgical history.   Immunization History  Administered Date(s) Administered  . Influenza,inj,Quad PF,6+ Mos 03/17/2016  . Tdap 03/17/2016    Current Meds  Medication Sig  . atorvastatin (LIPITOR) 80 MG tablet TAKE 1 TABLET BY MOUTH EVERY DAY  AT 6PM  . [facility] ASPIRIN EC 325 MG EC tablet TAKE 1 TABLET BY MOUTH EVERY DAY  . metoprolol tartrate (LOPRESSOR) 50 MG tablet Take 1 tablet (50 mg total) by mouth 2 (two) times daily.    No Known Allergies  Ht 5\' 3"  (1.6 m)   Wt 171 lb (77.6 kg)   BMI 30.29 kg/m   Review of Systems  Constitutional: Negative.   HENT: Negative.   Eyes: Positive for visual disturbance (glasses).  Respiratory: Negative.   Cardiovascular: Negative.   Gastrointestinal: Negative.   Endocrine: Negative.   Genitourinary: Negative.   Musculoskeletal: Negative.   Skin: Negative.   Allergic/Immunologic: Negative.   Neurological: Positive for weakness (residual right sided weakness).  Hematological: Negative.   Psychiatric/Behavioral: Negative.      Objective:   Physical Exam  Constitutional: He is oriented to person, place, and time. He appears well-developed and well-nourished.  HENT:   Head: Normocephalic and atraumatic.  Right Ear: External ear normal.  Left Ear: External ear normal.  Nose: Nose normal.  Mouth/Throat: Oropharynx is clear and moist.  Eyes: Pupils are equal, round, and reactive to light. Conjunctivae and EOM are normal.  Neck: Normal range of motion. Neck supple.  Cardiovascular: Normal rate, regular rhythm, normal heart sounds and intact distal pulses.  Pulmonary/Chest: Effort normal and breath sounds normal.  Abdominal: Soft.  Musculoskeletal:  Limited ROM in right extremity. Increased weakness in right arm.   Neurological: He is alert and oriented to person, place, and time.  Skin: Skin is warm and dry. Capillary refill takes less than 2 seconds.  Psychiatric: He has a normal mood and affect. His behavior is normal. Judgment and thought content normal.  Nursing note and vitals reviewed.  Assessment & Plan:   1. Hypertension, unspecified type Blood pressure is mildly elevated today. He has not been taking Norvasc. We will refill Norvasc today.  - amLODipine (NORVASC) 2.5 MG tablet; Take 1 tablet (2.5 mg total) by mouth daily.  Dispense: 30 tablet; Refill: 1  2. Screening for diabetes mellitus Hgb A1c is normal at 5.6 today.  - POCT glycosylated hemoglobin (Hb A1C) - POCT urinalysis dipstick  3. Abnormal urinalysis - Urine Culture  4. 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 He his completed PT/OT. He has residual right-sided weakness. He continues to have significant weakness in right arm.    5. Follow up He will follow up in 1 month.   Meds ordered this encounter  Medications  . amLODipine (NORVASC) 2.5 MG tablet    Sig: Take 1 tablet (2.5 mg total) by mouth daily.    Dispense:  30 tablet    Refill:  1    [name],  MSN, FNP-C Patient [facility] [address] [place], [place] [address] [phone]

## 2017-11-01 LAB — URINE CULTURE

## 2017-11-24 ENCOUNTER — Other Ambulatory Visit: Payer: Self-pay | Admitting: Family Medicine

## 2017-11-24 DIAGNOSIS — E7800 Pure hypercholesterolemia, unspecified: Secondary | ICD-10-CM

## 2017-12-01 ENCOUNTER — Ambulatory Visit (INDEPENDENT_AMBULATORY_CARE_PROVIDER_SITE_OTHER): Payer: Self-pay | Admitting: Family Medicine

## 2017-12-01 ENCOUNTER — Encounter: Payer: Self-pay | Admitting: Family Medicine

## 2017-12-01 ENCOUNTER — Ambulatory Visit: Payer: Self-pay | Admitting: Family Medicine

## 2017-12-01 VITALS — BP 150/92 | HR 86 | Temp 97.9°F | Ht 63.0 in | Wt 170.6 lb

## 2017-12-01 DIAGNOSIS — Z8673 Personal history of transient ischemic attack (TIA), and cerebral infarction without residual deficits: Secondary | ICD-10-CM

## 2017-12-01 DIAGNOSIS — I10 Essential (primary) hypertension: Secondary | ICD-10-CM

## 2017-12-01 DIAGNOSIS — Z09 Encounter for follow-up examination after completed treatment for conditions other than malignant neoplasm: Secondary | ICD-10-CM

## 2017-12-01 LAB — POCT URINALYSIS DIP (MANUAL ENTRY)
Bilirubin, UA: NEGATIVE
Glucose, UA: NEGATIVE mg/dL
Ketones, POC UA: NEGATIVE mg/dL
Leukocytes, UA: NEGATIVE
Nitrite, UA: NEGATIVE
Protein Ur, POC: NEGATIVE mg/dL
Spec Grav, UA: 1.025 (ref 1.010–1.025)
Urobilinogen, UA: 0.2 E.U./dL
pH, UA: 5.5 (ref 5.0–8.0)

## 2017-12-01 MED ORDER — METOPROLOL TARTRATE 50 MG PO TABS: 50.0000 mg | ORAL_TABLET | Freq: Two times a day (BID) | ORAL | 3 refills | Status: DC

## 2017-12-01 MED FILL — METOPROLOL TARTRATE 50 MG T: 50 | 30 days supply | Qty: 60 | Fill #0

## 2017-12-01 NOTE — Progress Notes (Signed)
Follow Up  Subjective:    Patient ID: Brad Tran, male    DOB: 08/25/1965, 52 y.o.   MRN: [id number]   Chief Complaint  Patient presents with  . Follow-up    blood pressure   HPI  Brad Tran has a past medical history of Stroke, Hypertensin, and Hyperlipidemia. He is here today for follow up.   Current Status: Since his last office visit, he is doing well with no complaints. He has not taken Metoprolol in 1 month. He denies visual changes, chest pain, heart palpitations, and falls. He has occasionally headaches and dizziness with position changes. Denies severe headaches, confusion, seizures, double vision, and blurred vision, nausea and vomiting.  He denies fevers, chills, fatigue, recent infections, weight loss, and night sweats.   He has not had any headaches, visual changes, dizziness, and falls.   No chest pain, heart palpitations, cough and shortness of breath reported.   No reports of GI problems such as diarrhea, and constipation. He has no reports of blood in stools, dysuria and hematuria.   No depression or anxiety reported.   He denies pain today.   Past Medical History:  Diagnosis Date  . Hyperlipidemia   . Hypertension   . Stroke [facility](HCC)     Family History  Problem Relation Age of Onset  . Heart attack Father   . Stroke Father     Social History   Socioeconomic History  . Marital status: Divorced    Spouse name: Not on file  . Number of children: Not on file  . Years of education: Not on file  . Highest education level: Not on file  Occupational History  . Not on file  Social Needs  . Financial resource strain: Not on file  . Food insecurity:    Worry: Not on file    Inability: Not on file  . Transportation needs:    Medical: Not on file    Non-medical: Not on file  Tobacco Use  . Smoking status: Never Smoker  . Smokeless tobacco: Never Used  Substance and Sexual Activity  . Alcohol use: No  . Drug use: No  . Sexual activity: Not on file   Lifestyle  . Physical activity:    Days per week: Not on file    Minutes per session: Not on file  . Stress: Not on file  Relationships  . Social connections:    Talks on phone: Not on file    Gets together: Not on file    Attends religious service: Not on file    Active member of club or organization: Not on file    Attends meetings of clubs or organizations: Not on file    Relationship status: Not on file  . Intimate partner violence:    Fear of current or ex partner: Not on file    Emotionally abused: Not on file    Physically abused: Not on file    Forced sexual activity: Not on file  Other Topics Concern  . Not on file  Social History Narrative  . Not on file    History reviewed. No pertinent surgical history.  Immunization History  Administered Date(s) Administered  . Influenza,inj,Quad PF,6+ Mos 03/17/2016  . Tdap 03/17/2016    Current Meds  Medication Sig  . amLODipine (NORVASC) 2.5 MG tablet Take 1 tablet (2.5 mg total) by mouth daily.  Brad Tran. atorvastatin (LIPITOR) 80 MG tablet TAKE 1 TABLET BY MOUTH EVERY DAY AT 6 PM  . [facility] ASPIRIN  EC 325 MG EC tablet TAKE 1 TABLET BY MOUTH EVERY DAY   No Known Allergies  BP (!) 150/92 (BP Location: Left Arm, Patient Position: Sitting, Cuff Size: Large)   Pulse 86   Temp 97.9 F (36.6 C) (Oral)   Ht 5\' 3"  (1.6 m)   Wt 170 lb 9.6 oz (77.4 kg)   SpO2 98%   BMI 30.22 kg/m   Review of Systems  Constitutional: Negative.   HENT: Negative.   Eyes: Negative.   Respiratory: Negative.   Cardiovascular: Negative.   Gastrointestinal: Negative.   Endocrine: Negative.   Genitourinary: Negative.   Musculoskeletal: Negative.   Allergic/Immunologic: Negative.   Neurological: Positive for weakness (r/t recent stroke).  Hematological: Negative.   Psychiatric/Behavioral: Negative.    Objective:   Physical Exam  Constitutional: He appears well-developed and well-nourished.  HENT:  Head: Normocephalic and atraumatic.  Right  Ear: External ear normal.  Left Ear: External ear normal.  Nose: Nose normal.  Mouth/Throat: Oropharynx is clear and moist.  Cardiovascular: Normal rate, regular rhythm, normal heart sounds and intact distal pulses.  Pulmonary/Chest: Effort normal and breath sounds normal.  Abdominal: Soft. Bowel sounds are normal.  Musculoskeletal: Normal range of motion.  Limited ROM in right extremity. Recent stroke.  Skin: Skin is warm and dry. Capillary refill takes less than 2 seconds.  Psychiatric: He has a normal mood and affect. His behavior is normal. Judgment and thought content normal.  Nursing note and vitals reviewed.  Assessment & Plan:   1. Hypertension, unspecified type Blood pressure is elevated at 150/92 today. We will send refill for Amlodipine today. He will continue Amlodipine and Metoprolol as prescribed. Monitor.   - POCT urinalysis dipstick - metoprolol tartrate (LOPRESSOR) 50 MG tablet; Take 1 tablet (50 mg total) by mouth 2 (two) times daily.  Dispense: 60 tablet; Refill: 3  2. 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 He has residual right sided weakness.   3. Follow up He will follow up in 3 months.   Meds ordered this encounter  Medications  . metoprolol tartrate (LOPRESSOR) 50 MG tablet    Sig: Take 1 tablet (50 mg total) by mouth 2 (two) times daily.    Dispense:  60 tablet    Refill:  3    Raliegh IpNatalie Danelle Curiale,  MSN, [facility]lingFNP-C Patient [facility] [address]  [place], [place] [id number] [phone]

## 2017-12-27 IMAGING — CT CT HEAD CODE STROKE
3 of 4 series · 15 of 47 positions shown, 18 images · non-contrast
Comparison: None.

CLINICAL DATA: Acute onset of slurred speech. Code stroke. Initial
encounter.

EXAM:
CT HEAD WITHOUT CONTRAST
TECHNIQUE: Contiguous axial images were obtained from the base of the skull
through the vertex without intravenous contrast.

[Series 2: head w/o · axial · non-contrast · 0.45mm/px · z∈[-187,-62]mm · 9 of 31 slices shown, 12 images]
[im 3/31  brain]
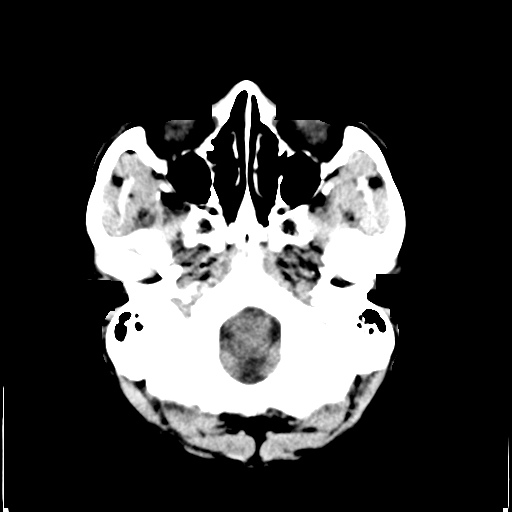
[im 3/31  bone]
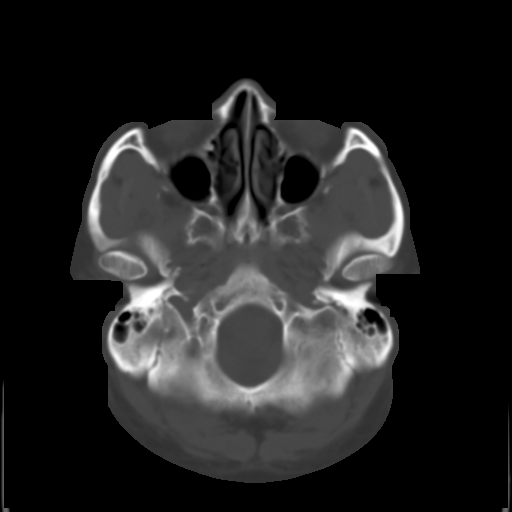
[im 7/31  brain]
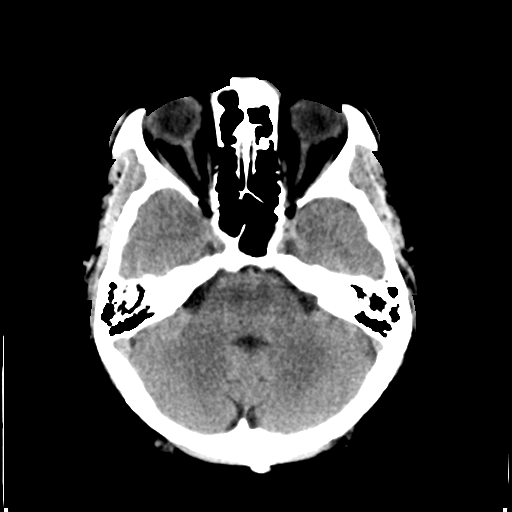
[im 9/31  brain]
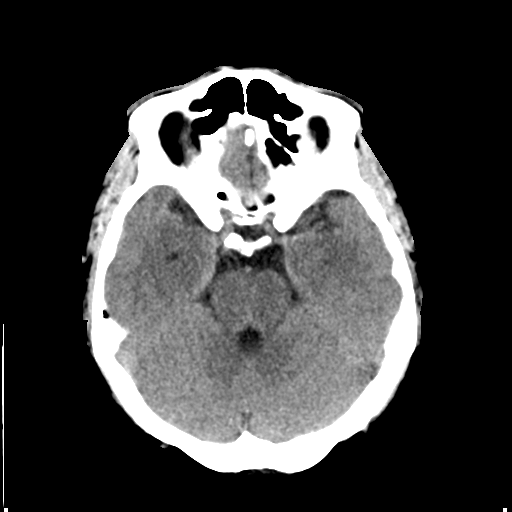
[im 13/31  brain]
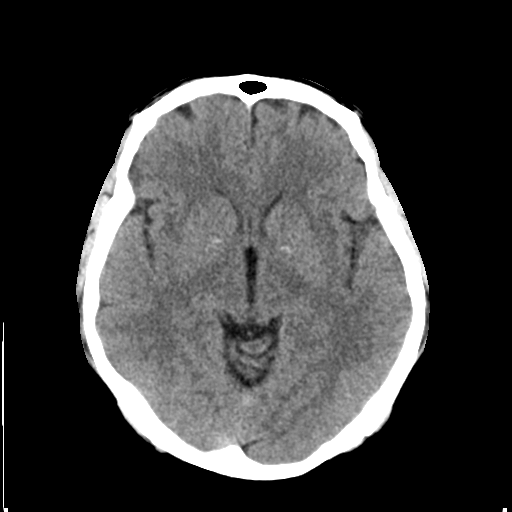
[im 16/31  brain]
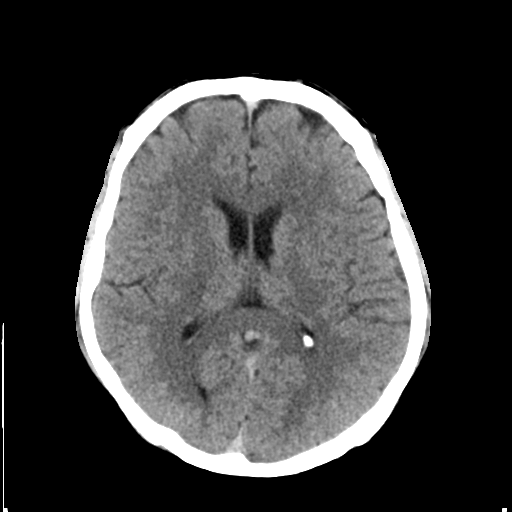
[im 16/31  bone]
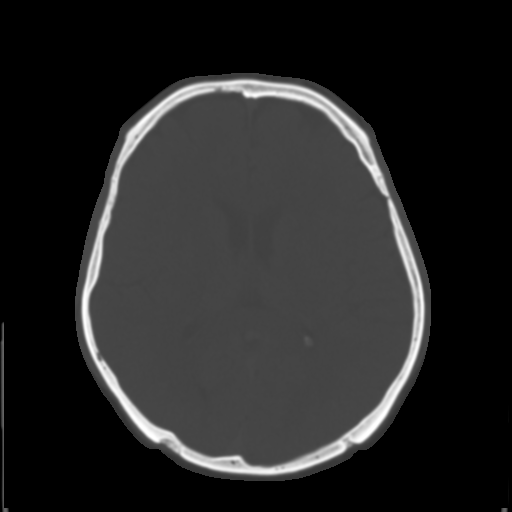
[im 18/31  brain]
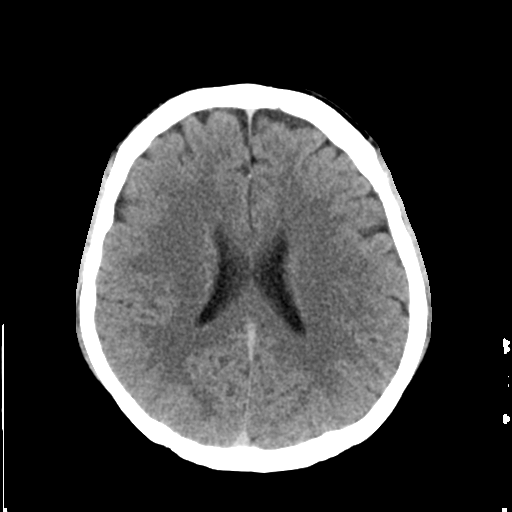
[im 22/31  brain]
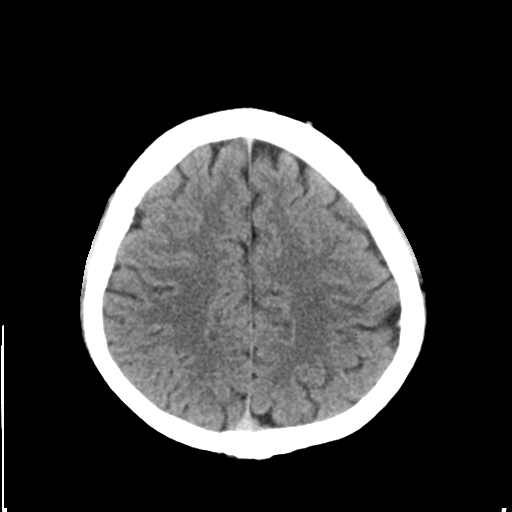
[im 24/31  brain]
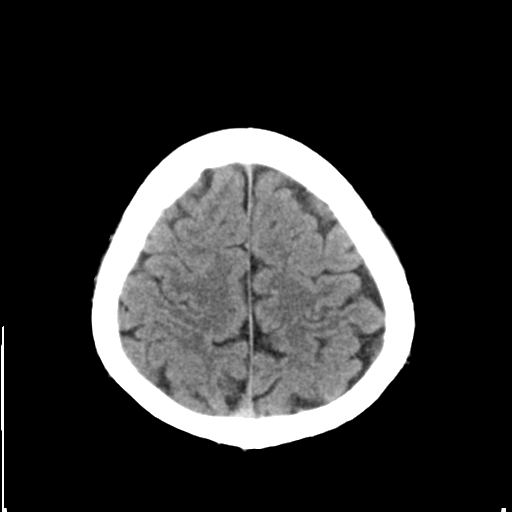
[im 28/31  brain]
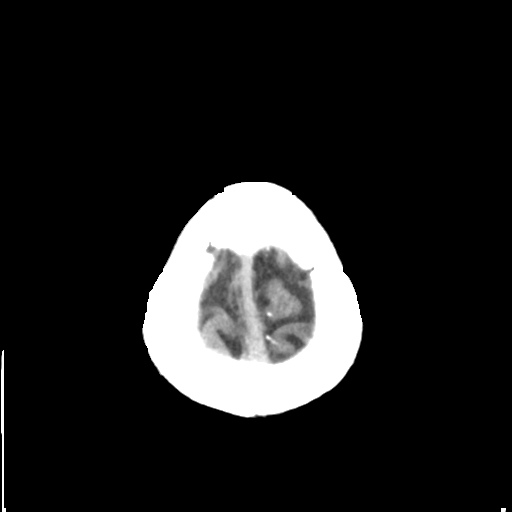
[im 28/31  bone]
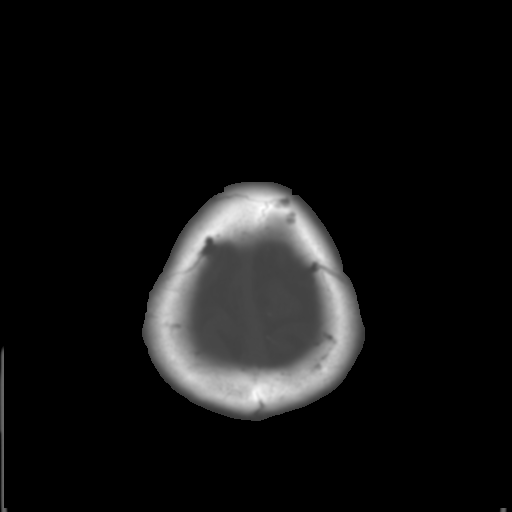

[Series 5: coronal · coronal · 0.29mm/px · 3 of 62 slices shown]
[im 21/62  brain]
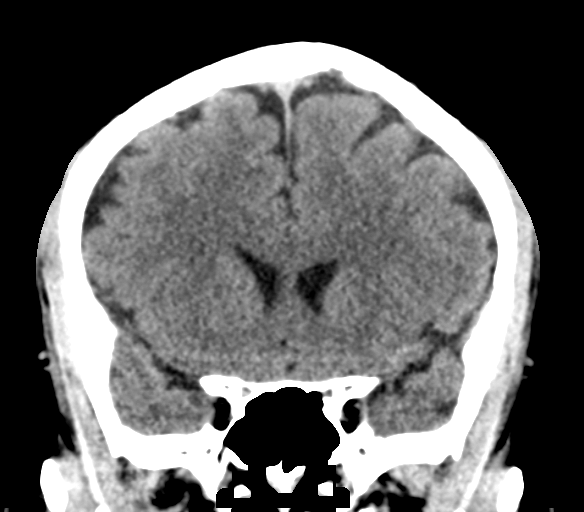
[im 28/62  brain]
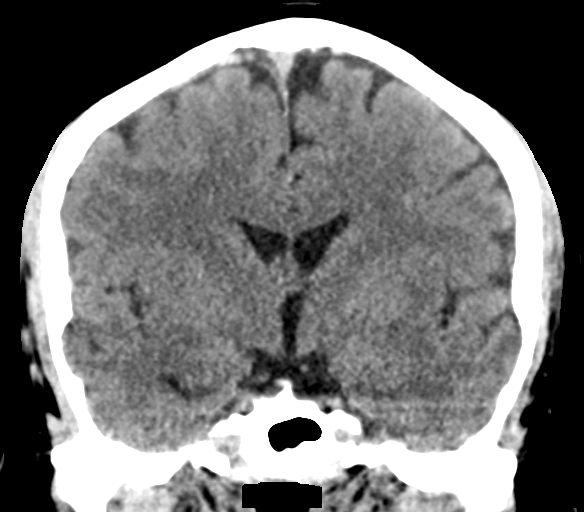
[im 34/62  brain]
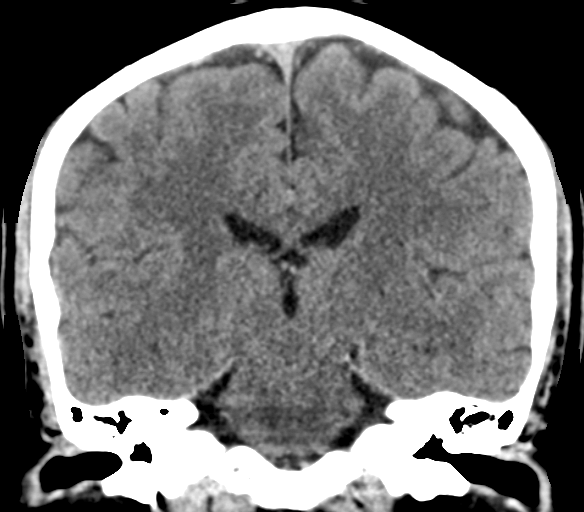

[Series 6: sagittal · sagittal · 0.30mm/px · 3 of 56 slices shown]
[im 19/56  brain]
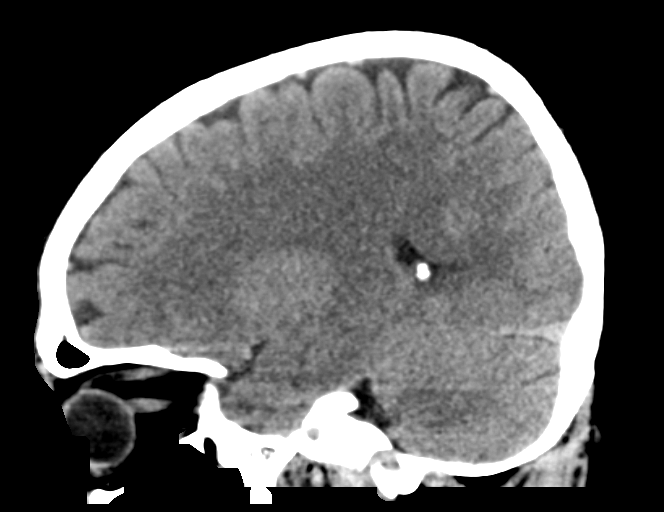
[im 28/56  brain]
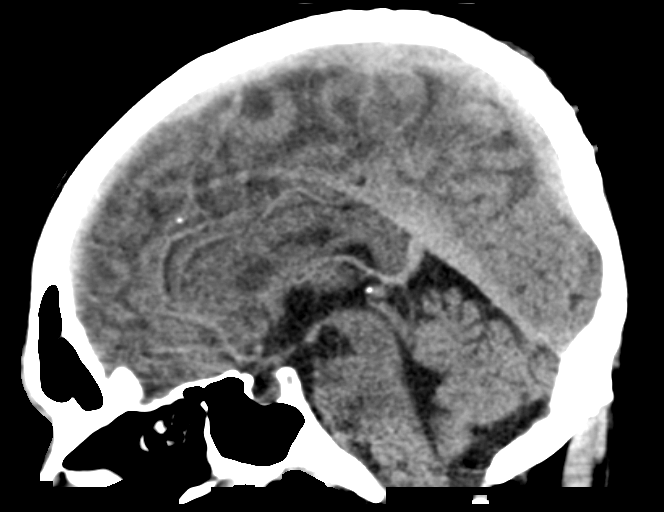
[im 37/56  brain]
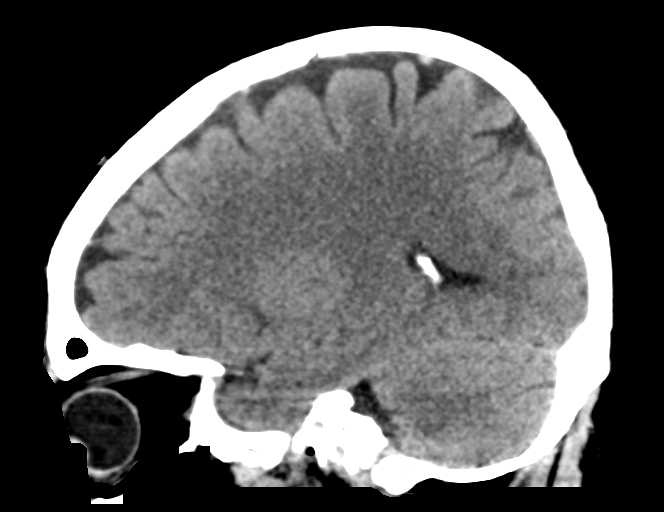

[15 of 47 positions shown; findings below may reference images not displayed]

FINDINGS: There is no evidence of acute infarction, mass lesion, or intra- or
extra-axial hemorrhage on CT.

A small chronic infarct is noted at the left thalamus.

The posterior fossa, including the cerebellum, brainstem and fourth
ventricle, is within normal limits. The third and lateral ventricles
are unremarkable in appearance. The cerebral hemispheres are
symmetric in appearance, with normal gray-white differentiation. No
mass effect or midline shift is seen.

There is no evidence of fracture; visualized osseous structures are
unremarkable in appearance. The orbits are within normal limits. A
small mucus retention cyst or polyp is noted at the right maxillary
sinus. The remaining paranasal sinuses and mastoid air cells are
well-aerated. No significant soft tissue abnormalities are seen.
IMPRESSION: 1. No acute intracranial pathology seen on CT.
2. Small chronic infarct at the left thalamus.
3. Small mucus retention cyst or polyp at the right maxillary sinus.
These results were called by telephone at the time of interpretation
on 10/25/2015 at [DATE] to Dr. ANTONIO CABANGA BENZZ, who verbally
acknowledged these results.

## 2018-01-02 ENCOUNTER — Telehealth: Payer: Self-pay

## 2018-01-02 MED FILL — METOPROLOL TARTRATE 50 MG T: 50 | 30 days supply | Qty: 60 | Fill #1

## 2018-01-03 NOTE — Telephone Encounter (Signed)
Please inform patient to contact pharmacy and they will contact us if he needs additional refills.   Thank you.

## 2018-01-04 ENCOUNTER — Telehealth: Payer: Self-pay

## 2018-01-04 DIAGNOSIS — I10 Essential (primary) hypertension: Secondary | ICD-10-CM

## 2018-01-05 MED ORDER — AMLODIPINE BESYLATE 2.5 MG PO TABS: 2.5000 mg | ORAL_TABLET | Freq: Every day | ORAL | 1 refills | Status: DC

## 2018-01-05 MED FILL — AMLODIPINE 2.5 MG TABLET: 2.5 | 30 days supply | Qty: 30 | Fill #0

## 2018-01-05 NOTE — Telephone Encounter (Signed)
Medication sent to pharmacy  

## 2018-01-31 MED FILL — AMLODIPINE 2.5 MG TABLET: 2.5 | 30 days supply | Qty: 30 | Fill #1

## 2018-01-31 MED FILL — METOPROLOL TARTRATE 50 MG T: 50 | 30 days supply | Qty: 60 | Fill #2

## 2018-03-05 ENCOUNTER — Ambulatory Visit (INDEPENDENT_AMBULATORY_CARE_PROVIDER_SITE_OTHER): Payer: Self-pay | Admitting: Family Medicine

## 2018-03-05 ENCOUNTER — Encounter: Payer: Self-pay | Admitting: Family Medicine

## 2018-03-05 VITALS — BP 152/90 | HR 64 | Temp 97.7°F | Ht 63.0 in | Wt 171.4 lb

## 2018-03-05 DIAGNOSIS — E7800 Pure hypercholesterolemia, unspecified: Secondary | ICD-10-CM

## 2018-03-05 DIAGNOSIS — Z09 Encounter for follow-up examination after completed treatment for conditions other than malignant neoplasm: Secondary | ICD-10-CM

## 2018-03-05 DIAGNOSIS — Z8673 Personal history of transient ischemic attack (TIA), and cerebral infarction without residual deficits: Secondary | ICD-10-CM

## 2018-03-05 DIAGNOSIS — I10 Essential (primary) hypertension: Secondary | ICD-10-CM

## 2018-03-05 DIAGNOSIS — R262 Difficulty in walking, not elsewhere classified: Secondary | ICD-10-CM

## 2018-03-05 DIAGNOSIS — Z23 Encounter for immunization: Secondary | ICD-10-CM

## 2018-03-05 LAB — POCT URINALYSIS DIP (MANUAL ENTRY)
Bilirubin, UA: NEGATIVE
Blood, UA: NEGATIVE
Glucose, UA: NEGATIVE mg/dL
Ketones, POC UA: NEGATIVE mg/dL
Leukocytes, UA: NEGATIVE
Nitrite, UA: NEGATIVE
Protein Ur, POC: NEGATIVE mg/dL
Spec Grav, UA: 1.025 (ref 1.010–1.025)
Urobilinogen, UA: 2 E.U./dL — AB
pH, UA: 7 (ref 5.0–8.0)

## 2018-03-05 MED ORDER — AMLODIPINE BESYLATE 2.5 MG PO TABS: 2.5000 mg | ORAL_TABLET | Freq: Every day | ORAL | 1 refills | Status: DC

## 2018-03-05 MED ORDER — METOPROLOL TARTRATE 50 MG PO TABS: 50.0000 mg | ORAL_TABLET | Freq: Two times a day (BID) | ORAL | 3 refills | Status: DC

## 2018-03-05 MED ORDER — ATORVASTATIN CALCIUM 80 MG PO TABS: ORAL_TABLET | ORAL | 2 refills | Status: DC

## 2018-03-05 MED FILL — METOPROLOL TARTRATE 50 MG T: 50 | 30 days supply | Qty: 60 | Fill #0

## 2018-03-05 MED FILL — ATORVASTATIN 80 MG TABLET: 80 | 30 days supply | Qty: 30 | Fill #0

## 2018-03-05 MED FILL — AMLODIPINE 2.5 MG TABLET: 2.5 | 30 days supply | Qty: 30 | Fill #0

## 2018-03-05 NOTE — Progress Notes (Signed)
Follow Up  Subjective:    Patient ID: Brad Tran, male    DOB: Aug 16, 1965, 53 y.o.   MRN: [id number]   Chief Complaint  Patient presents with  . Follow-up    Chronic condition    HPI  Brad Tran is a 52 year old male with a past medical history of Stroke, Hypertension, and Hyperlipidemia. He is here today for follow up assessment of chronic diseases.   Current Status: Since his last office visit, he is doing well with no complaints. He has right extremity residual weakness. He ambulates with the assistance of a cane. He denies visual changes, chest pain, cough, shortness of breath, heart palpitations, and falls. He has occasionally headaches and dizziness with position changes. Denies severe headaches, confusion, seizures, double vision, and blurred vision, nausea and vomiting.  He denies fevers, chills, recent infections, weight loss, and night sweats. No reports of GI problems such as diarrhea, and constipation. He has no reports of blood in stools, dysuria and hematuria. No depression or anxiety reported. He denies pain today.   Past Medical History:  Diagnosis Date  . Hyperlipidemia   . Hypertension   . Stroke [facility])     Family History  Problem Relation Age of Onset  . Heart attack Father   . Stroke Father     Social History   Socioeconomic History  . Marital status: Divorced    Spouse name: Not on file  . Number of children: Not on file  . Years of education: Not on file  . Highest education level: Not on file  Occupational History  . Not on file  Social Needs  . Financial resource strain: Not on file  . Food insecurity:    Worry: Not on file    Inability: Not on file  . Transportation needs:    Medical: Not on file    Non-medical: Not on file  Tobacco Use  . Smoking status: Never Smoker  . Smokeless tobacco: Never Used  Substance and Sexual Activity  . Alcohol use: No  . Drug use: No  . Sexual activity: Not on file  Lifestyle  . Physical activity:    Days  per week: Not on file    Minutes per session: Not on file  . Stress: Not on file  Relationships  . Social connections:    Talks on phone: Not on file    Gets together: Not on file    Attends religious service: Not on file    Active member of club or organization: Not on file    Attends meetings of clubs or organizations: Not on file    Relationship status: Not on file  . Intimate partner violence:    Fear of current or ex partner: Not on file    Emotionally abused: Not on file    Physically abused: Not on file    Forced sexual activity: Not on file  Other Topics Concern  . Not on file  Social History Narrative  . Not on file    No past surgical history on file.  Immunization History  Administered Date(s) Administered  . Influenza,inj,Quad PF,6+ Mos 03/17/2016, 03/05/2018  . Tdap 03/17/2016    Current Meds  Medication Sig  . amLODipine (NORVASC) 2.5 MG tablet Take 1 tablet (2.5 mg total) by mouth daily.  Marland Kitchen atorvastatin (LIPITOR) 80 MG tablet TAKE 1 TABLET BY MOUTH EVERY DAY AT 6 PM  . [facility] ASPIRIN EC 325 MG EC tablet TAKE 1 TABLET BY MOUTH EVERY  DAY  . metoprolol tartrate (LOPRESSOR) 50 MG tablet Take 1 tablet (50 mg total) by mouth 2 (two) times daily.  . ranitidine (ZANTAC) 150 MG tablet TAKE 1 TABLET BY MOUTH TWICE A DAY  . [DISCONTINUED] amLODipine (NORVASC) 2.5 MG tablet Take 1 tablet (2.5 mg total) by mouth daily.  . [DISCONTINUED] atorvastatin (LIPITOR) 80 MG tablet TAKE 1 TABLET BY MOUTH EVERY DAY AT 6 PM  . [DISCONTINUED] metoprolol tartrate (LOPRESSOR) 50 MG tablet Take 1 tablet (50 mg total) by mouth 2 (two) times daily.   No Known Allergies  BP (!) 152/90   Pulse 64   Temp 97.7 F (36.5 C) (Oral)   Ht 5\' 3"  (1.6 m)   Wt 171 lb 6.4 oz (77.7 kg)   SpO2 98%   BMI 30.36 kg/m   Review of Systems  Constitutional: Negative.   HENT: Negative.   Eyes: Negative.   Respiratory: Negative.   Cardiovascular: Negative.   Gastrointestinal: Negative.   Endocrine:  Negative.   Genitourinary: Negative.   Musculoskeletal: Negative.   Skin: Negative.   Allergic/Immunologic: Negative.   Neurological: Positive for dizziness, weakness (right sided extremity weakness r/t stroke.) and headaches.  Hematological: Negative.   Psychiatric/Behavioral: Negative.    Objective:   Physical Exam  Constitutional: He is oriented to person, place, and time. He appears well-developed and well-nourished.  HENT:  Head: Normocephalic and atraumatic.  Right Ear: External ear normal.  Eyes: Pupils are equal, round, and reactive to light. Conjunctivae and EOM are normal.  Neck: Normal range of motion. Neck supple.  Cardiovascular: Normal rate, regular rhythm, normal heart sounds and intact distal pulses.  Pulmonary/Chest: Effort normal and breath sounds normal.  Abdominal: Soft. Bowel sounds are normal.  Musculoskeletal: Normal range of motion.  Neurological: He is alert and oriented to person, place, and time.  Skin: Skin is warm and dry.  Psychiatric: He has a normal mood and affect. His behavior is normal. Judgment and thought content normal.  Nursing note and vitals reviewed.  Assessment & Plan:   1. Hypertension, unspecified type Antihypertensive medications as prescribed. Continue Amlodipine and Metoprolol as prescribe. He will continue to decrease high sodium intake, excessive alcohol intake, increase potassium intake, smoking cessation, and increase physical activity of at least 30 minutes of cardio activity daily. He will continue to follow Heart Healthy or DASH diet. - POCT urinalysis dipstick - amLODipine (NORVASC) 2.5 MG tablet; Take 1 tablet (2.5 mg total) by mouth daily.  Dispense: 30 tablet; Refill: 1 - metoprolol tartrate (LOPRESSOR) 50 MG tablet; Take 1 tablet (50 mg total) by mouth 2 (two) times daily.  Dispense: 60 tablet; Refill: 3  2. 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 He is doing well. No signs or symptoms of recurrence.  Monitor.  3. Hypercholesteremia - atorvastatin (LIPITOR) 80 MG tablet; TAKE 1 TABLET BY MOUTH EVERY DAY AT 6 PM  Dispense: 90 tablet; Refill: 2  4. Need for immunization against influenza - Flu Vaccine QUAD 36+ mos IM  5. Impaired ambulation Ambulation is good. Patient uses cane for right sided extremity weakness, r/t recent stroke. He will continues to ambulate cautiously.   6. Follow up He will follow up in 6 months.   Meds ordered this encounter  Medications  . amLODipine (NORVASC) 2.5 MG tablet    Sig: Take 1 tablet (2.5 mg total) by mouth daily.    Dispense:  30 tablet    Refill:  1  . atorvastatin (LIPITOR) 80 MG tablet  Sig: TAKE 1 TABLET BY MOUTH EVERY DAY AT 6 PM    Dispense:  90 tablet    Refill:  2  . metoprolol tartrate (LOPRESSOR) 50 MG tablet    Sig: Take 1 tablet (50 mg total) by mouth 2 (two) times daily.    Dispense:  60 tablet    Refill:  3   [name],  MSN, [facility] [address] [place], [place] [address] [phone]

## 2018-04-12 MED FILL — AMLODIPINE 2.5 MG TABLET: 2.5 | 30 days supply | Qty: 30 | Fill #1

## 2018-04-12 MED FILL — ATORVASTATIN 80 MG TABLET: 80 | 30 days supply | Qty: 30 | Fill #1

## 2018-04-12 MED FILL — METOPROLOL TARTRATE 50 MG T: 50 | 30 days supply | Qty: 60 | Fill #1

## 2018-05-10 ENCOUNTER — Other Ambulatory Visit: Payer: Self-pay | Admitting: Family Medicine

## 2018-05-10 DIAGNOSIS — I10 Essential (primary) hypertension: Secondary | ICD-10-CM

## 2018-05-10 MED FILL — ATORVASTATIN 80 MG TABLET: 80 | 30 days supply | Qty: 30 | Fill #2

## 2018-05-10 MED FILL — METOPROLOL TARTRATE 50 MG T: 50 | 30 days supply | Qty: 60 | Fill #2

## 2018-05-14 ENCOUNTER — Other Ambulatory Visit: Payer: Self-pay | Admitting: Family Medicine

## 2018-05-14 DIAGNOSIS — I10 Essential (primary) hypertension: Secondary | ICD-10-CM

## 2018-05-17 ENCOUNTER — Other Ambulatory Visit: Payer: Self-pay

## 2018-05-17 DIAGNOSIS — I10 Essential (primary) hypertension: Secondary | ICD-10-CM

## 2018-05-17 MED ORDER — AMLODIPINE BESYLATE 2.5 MG PO TABS: 2.5000 mg | ORAL_TABLET | Freq: Every day | ORAL | 3 refills | Status: DC

## 2018-05-17 MED FILL — AMLODIPINE 2.5 MG TABLET: 2.5 | 30 days supply | Qty: 30 | Fill #0

## 2018-05-17 NOTE — Telephone Encounter (Signed)
Medication sent to pharmacy  

## 2018-06-11 MED FILL — ATORVASTATIN 80 MG TABLET: 80 | 30 days supply | Qty: 30 | Fill #3

## 2018-06-11 MED FILL — METOPROLOL TARTRATE 50 MG T: 50 | 30 days supply | Qty: 60 | Fill #3

## 2018-06-11 MED FILL — AMLODIPINE 2.5 MG TABLET: 2.5 | 30 days supply | Qty: 30 | Fill #1

## 2018-07-19 ENCOUNTER — Other Ambulatory Visit: Payer: Self-pay | Admitting: Family Medicine

## 2018-07-19 DIAGNOSIS — I10 Essential (primary) hypertension: Secondary | ICD-10-CM

## 2018-07-19 MED FILL — METOPROLOL TARTRATE 50 MG T: 50 | 30 days supply | Qty: 60 | Fill #3

## 2018-07-19 MED FILL — AMLODIPINE 2.5 MG TABLET: 2.5 | 30 days supply | Qty: 30 | Fill #2

## 2018-07-19 MED FILL — ATORVASTATIN 80 MG TABLET: 80 | 30 days supply | Qty: 30 | Fill #4

## 2018-08-28 ENCOUNTER — Other Ambulatory Visit: Payer: Self-pay | Admitting: Family Medicine

## 2018-08-28 DIAGNOSIS — I10 Essential (primary) hypertension: Secondary | ICD-10-CM

## 2018-08-28 MED FILL — ATORVASTATIN 80 MG TABLET: 80 | 30 days supply | Qty: 30 | Fill #5

## 2018-08-28 MED FILL — AMLODIPINE 2.5 MG TABLET: 2.5 | 30 days supply | Qty: 30 | Fill #3

## 2018-09-03 ENCOUNTER — Ambulatory Visit: Payer: Self-pay | Admitting: Family Medicine

## 2018-09-17 ENCOUNTER — Telehealth: Payer: Self-pay

## 2018-09-17 NOTE — Telephone Encounter (Signed)
Patient schedule appointment for tomorrow. Patient is negative for cov-19 screening and will be coming in office

## 2018-09-18 ENCOUNTER — Other Ambulatory Visit: Payer: Self-pay

## 2018-09-18 ENCOUNTER — Ambulatory Visit (INDEPENDENT_AMBULATORY_CARE_PROVIDER_SITE_OTHER): Payer: Self-pay | Admitting: Family Medicine

## 2018-09-18 ENCOUNTER — Encounter: Payer: Self-pay | Admitting: Family Medicine

## 2018-09-18 VITALS — BP 152/90 | HR 80 | Temp 98.1°F | Ht 63.0 in | Wt 173.0 lb

## 2018-09-18 DIAGNOSIS — Z09 Encounter for follow-up examination after completed treatment for conditions other than malignant neoplasm: Secondary | ICD-10-CM

## 2018-09-18 DIAGNOSIS — Z8673 Personal history of transient ischemic attack (TIA), and cerebral infarction without residual deficits: Secondary | ICD-10-CM

## 2018-09-18 DIAGNOSIS — R531 Weakness: Secondary | ICD-10-CM

## 2018-09-18 DIAGNOSIS — I10 Essential (primary) hypertension: Secondary | ICD-10-CM

## 2018-09-18 DIAGNOSIS — Z131 Encounter for screening for diabetes mellitus: Secondary | ICD-10-CM

## 2018-09-18 DIAGNOSIS — E7800 Pure hypercholesterolemia, unspecified: Secondary | ICD-10-CM

## 2018-09-18 LAB — POCT URINALYSIS DIP (MANUAL ENTRY)
Bilirubin, UA: NEGATIVE
Blood, UA: NEGATIVE
Glucose, UA: NEGATIVE mg/dL
Ketones, POC UA: NEGATIVE mg/dL
Leukocytes, UA: NEGATIVE
Nitrite, UA: NEGATIVE
Spec Grav, UA: >=1.03 — AB (ref 1.010–1.025)
Urobilinogen, UA: 2 E.U./dL — AB
pH, UA: 5.5 (ref 5.0–8.0)

## 2018-09-18 LAB — POCT GLYCOSYLATED HEMOGLOBIN (HGB A1C): Hemoglobin A1C: 5.8 % — AB (ref 4.0–5.6)

## 2018-09-18 MED ORDER — ATORVASTATIN CALCIUM 80 MG PO TABS: ORAL_TABLET | ORAL | 3 refills | Status: DC

## 2018-09-18 MED ORDER — AMLODIPINE BESYLATE 2.5 MG PO TABS: 2.5000 mg | ORAL_TABLET | Freq: Every day | ORAL | 3 refills | Status: DC

## 2018-09-18 MED ORDER — METOPROLOL TARTRATE 50 MG PO TABS: 50.0000 mg | ORAL_TABLET | Freq: Two times a day (BID) | ORAL | 3 refills | Status: DC

## 2018-09-18 MED FILL — METOPROLOL TARTRATE 50 MG T: 50 | 30 days supply | Qty: 60 | Fill #0

## 2018-09-18 NOTE — Progress Notes (Signed)
[facility]   Established Patient Office Visit  Subjective:  Patient ID: Brad Tran, male    DOB: 21-Jun-1965  Age: 53 y.o. MRN: [id number]  CC:  Chief Complaint  Patient presents with  . Follow-up    chronic condition     HPI Brad Tran is a 53 year old male who presents for Follow Up today.   Past Medical History:  Diagnosis Date  . Hyperlipidemia   . Hypertension   . Stroke [facility])    Current Status: Since his last office visit, he is doing well with no complaints. He usually monitors blood pressures at home, with ranges of 120-132/70-83. He states that he had been out of medications for 1 month. He denies visual changes, chest pain, cough, shortness of breath, heart palpitations, and falls. He has occasional headaches and dizziness with position changes. Denies severe headaches, confusion, seizures, double vision, and blurred vision, nausea and vomiting. He is s/p: stoke, which he residual right extremity weakness.   He denies fevers, chills, fatigue, recent infections, weight loss, and night sweats. No reports of GI problems such as diarrhea, and constipation. He has no reports of blood in stools, dysuria and hematuria. No depression or anxiety, and denies suicidal ideations, homicidal ideations, or auditory hallucinations. He denies pain today.   History reviewed. No pertinent surgical history.  Family History  Problem Relation Age of Onset  . Heart attack Father   . Stroke Father     Social History   Socioeconomic History  . Marital status: Divorced    Spouse name: Not on file  . Number of children: Not on file  . Years of education: Not on file  . Highest education level: Not on file  Occupational History  . Not on file  Social Needs  . Financial resource strain: Not on file  . Food insecurity:    Worry: Not on file    Inability: Not on file  . Transportation needs:    Medical: Not on file    Non-medical:  Not on file  Tobacco Use  . Smoking status: Never Smoker  . Smokeless tobacco: Never Used  Substance and Sexual Activity  . Alcohol use: No  . Drug use: No  . Sexual activity: Not on file  Lifestyle  . Physical activity:    Days per week: Not on file    Minutes per session: Not on file  . Stress: Not on file  Relationships  . Social connections:    Talks on phone: Not on file    Gets together: Not on file    Attends religious service: Not on file    Active member of club or organization: Not on file    Attends meetings of clubs or organizations: Not on file    Relationship status: Not on file  . Intimate partner violence:    Fear of current or ex partner: Not on file    Emotionally abused: Not on file    Physically abused: Not on file    Forced sexual activity: Not on file  Other Topics Concern  . Not on file  Social History Narrative  . Not on file    Outpatient Medications Prior to Visit  Medication Sig Dispense Refill  . [facility] ASPIRIN EC 325 MG EC tablet TAKE 1 TABLET BY MOUTH EVERY DAY 100 tablet 0  . amLODipine (NORVASC) 2.5 MG tablet Take 1 tablet (2.5 mg total) by mouth daily. 30 tablet  3  . atorvastatin (LIPITOR) 80 MG tablet TAKE 1 TABLET BY MOUTH EVERY DAY AT 6 PM 90 tablet 2  . metoprolol tartrate (LOPRESSOR) 50 MG tablet Take 1 tablet (50 mg total) by mouth 2 (two) times daily. 60 tablet 3  . ranitidine (ZANTAC) 150 MG tablet TAKE 1 TABLET BY MOUTH TWICE A DAY (Patient not taking: Reported on 09/18/2018) 60 tablet 0   No facility-administered medications prior to visit.     No Known Allergies  ROS Review of Systems  Constitutional: Negative.   HENT: Negative.   Eyes: Negative.   Respiratory: Negative.   Cardiovascular: Negative.   Gastrointestinal: Negative.   Endocrine: Negative.   Genitourinary: Negative.   Musculoskeletal: Negative.   Skin: Negative.   Allergic/Immunologic: Negative.   Neurological: Positive for dizziness, weakness (residual right  extremity weakness. ) and headaches.  Hematological: Negative.   Psychiatric/Behavioral: Negative.     Objective:    Physical Exam  Constitutional: He is oriented to person, place, and time. He appears well-developed and well-nourished.  HENT:  Head: Normocephalic and atraumatic.  Eyes: Conjunctivae are normal.  Neck: Normal range of motion. Neck supple.  Pulmonary/Chest: Effort normal and breath sounds normal.  Abdominal: Soft. Bowel sounds are normal.  Musculoskeletal:     Comments: Residual right extremity weakness.   Neurological: He is alert and oriented to person, place, and time. He has normal reflexes.  Skin: Skin is warm and dry.  Psychiatric: He has a normal mood and affect. His behavior is normal. Judgment and thought content normal.  Vitals reviewed.   BP (!) 152/90 (BP Location: Left Arm, Patient Position: Sitting, Cuff Size: Large)   Pulse 80   Temp 98.1 F (36.7 C) (Oral)   Ht 5\' 3"  (1.6 m)   Wt 173 lb (78.5 kg)   SpO2 99%   BMI 30.65 kg/m  Wt Readings from Last 3 Encounters:  09/18/18 173 lb (78.5 kg)  03/05/18 171 lb 6.4 oz (77.7 kg)  12/01/17 170 lb 9.6 oz (77.4 kg)     Health Maintenance Due  Topic Date Due  . HIV Screening  07/21/1980  . COLONOSCOPY  07/22/2015    There are no preventive care reminders to display for this patient.  Lab Results  Component Value Date   TSH 0.771 09/18/2018   Lab Results  Component Value Date   WBC 3.7 09/18/2018   HGB 15.0 09/18/2018   HCT 45.3 09/18/2018   MCV 89 09/18/2018   PLT 219 09/18/2018   Lab Results  Component Value Date   NA 142 09/18/2018   K 3.9 09/18/2018   CO2 20 09/18/2018   GLUCOSE 118 (H) 09/18/2018   BUN 20 09/18/2018   CREATININE 1.11 09/18/2018   BILITOT 0.8 09/18/2018   ALKPHOS 74 09/18/2018   AST 25 09/18/2018   ALT 40 09/18/2018   PROT 6.9 09/18/2018   ALBUMIN 5.0 (H) 09/18/2018   CALCIUM 9.5 09/18/2018   ANIONGAP 6 08/27/2016   Lab Results  Component Value Date    CHOL 153 09/18/2018   Lab Results  Component Value Date   HDL 44 09/18/2018   Lab Results  Component Value Date   LDLCALC 95 09/18/2018   Lab Results  Component Value Date   TRIG 71 09/18/2018   Lab Results  Component Value Date   CHOLHDL 3.5 09/18/2018   Lab Results  Component Value Date   HGBA1C 5.8 (A) 09/18/2018      Assessment & Plan:   1.  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 No signs or symptoms of recurrence.   2. Right sided weakness Stable. Not worsening. He continues to use cane as needed.   3. Hypertension, unspecified type The current medical regimen is effective; blood presure is stable at 152/90 today; continue present plan and medications as prescribed. She will continue to decrease high sodium intake, excessive alcohol intake, increase potassium intake, smoking cessation, and increase physical activity of at least 30 minutes of cardio activity daily. She will continue to follow Heart Healthy or DASH diet. - metoprolol tartrate (LOPRESSOR) 50 MG tablet; Take 1 tablet (50 mg total) by mouth 2 (two) times daily.  Dispense: 60 tablet; Refill: 3 - amLODipine (NORVASC) 2.5 MG tablet; Take 1 tablet (2.5 mg total) by mouth daily.  Dispense: 30 tablet; Refill: 3 - CBC with Differential - Comprehensive metabolic panel - Lipid Panel - TSH - Vitamin D, 25-hydroxy - Vitamin B12  4. Hypercholesteremia - atorvastatin (LIPITOR) 80 MG tablet; TAKE 1 TABLET BY MOUTH EVERY DAY AT 6 PM  Dispense: 30 tablet; Refill: 3 - CBC with Differential - Comprehensive metabolic panel - Lipid Panel - TSH - Vitamin D, 25-hydroxy - Vitamin B12  5. Screening for diabetes mellitus Hgb A1c is stable at 5.8 today. He will continue to decrease foods/beverages high in sugars and carbs and follow Heart Healthy or DASH diet. Increase physical activity to at least 30 minutes cardio exercise daily.  - POCT urinalysis dipstick - POCT glycosylated hemoglobin (Hb A1C)  6. Follow  up He will follow up in 3 months.   Meds ordered this encounter  Medications  . metoprolol tartrate (LOPRESSOR) 50 MG tablet    Sig: Take 1 tablet (50 mg total) by mouth 2 (two) times daily.    Dispense:  60 tablet    Refill:  3  . amLODipine (NORVASC) 2.5 MG tablet    Sig: Take 1 tablet (2.5 mg total) by mouth daily.    Dispense:  30 tablet    Refill:  3  . atorvastatin (LIPITOR) 80 MG tablet    Sig: TAKE 1 TABLET BY MOUTH EVERY DAY AT 6 PM    Dispense:  30 tablet    Refill:  3    Orders Placed This Encounter  Procedures  . CBC with Differential  . Comprehensive metabolic panel  . Lipid Panel  . TSH  . Vitamin D, 25-hydroxy  . Vitamin B12  . POCT urinalysis dipstick  . POCT glycosylated hemoglobin (Hb A1C)    Referral Orders  No referral(s) requested today   [name],  MSN, FNP-BC [facility] [address] [place], [place] [address] [phone]    Problem List Items Addressed This Visit      Cardiovascular and Mediastinum   Hypertension   Relevant Medications   metoprolol tartrate (LOPRESSOR) 50 MG tablet   amLODipine (NORVASC) 2.5 MG tablet   atorvastatin (LIPITOR) 80 MG tablet   Other Relevant Orders   CBC with Differential (Completed)   Comprehensive metabolic panel (Completed)   Lipid Panel (Completed)   TSH (Completed)   Vitamin D, 25-hydroxy (Completed)   Vitamin B12 (Completed)    Other Visit Diagnoses    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    -  Primary   Right sided weakness       Hypercholesteremia       Relevant Medications   metoprolol tartrate (LOPRESSOR) 50 MG tablet   amLODipine (NORVASC) 2.5 MG  tablet   atorvastatin (LIPITOR) 80 MG tablet   Other Relevant Orders   CBC with Differential (Completed)   Comprehensive metabolic panel (Completed)   Lipid Panel (Completed)   TSH (Completed)   Vitamin D, 25-hydroxy (Completed)   Vitamin B12 (Completed)   Screening for diabetes mellitus        Relevant Orders   POCT urinalysis dipstick (Completed)   POCT glycosylated hemoglobin (Hb A1C) (Completed)   Follow up          Meds ordered this encounter  Medications  . metoprolol tartrate (LOPRESSOR) 50 MG tablet    Sig: Take 1 tablet (50 mg total) by mouth 2 (two) times daily.    Dispense:  60 tablet    Refill:  3  . amLODipine (NORVASC) 2.5 MG tablet    Sig: Take 1 tablet (2.5 mg total) by mouth daily.    Dispense:  30 tablet    Refill:  3  . atorvastatin (LIPITOR) 80 MG tablet    Sig: TAKE 1 TABLET BY MOUTH EVERY DAY AT 6 PM    Dispense:  30 tablet    Refill:  3    Follow-up: Return in about 3 months (around 12/19/2018).    Kallie Locks, FNP

## 2018-09-18 NOTE — Patient Instructions (Signed)
Preventing Type 2 Diabetes Mellitus  Type 2 diabetes (type 2 diabetes mellitus) is a long-term (chronic) disease that affects blood sugar (glucose) levels. Normally, a hormone called insulin allows glucose to enter cells in the body. The cells use glucose for energy. In type 2 diabetes, one or both of these problems may be present:   The body does not make enough insulin.   The body does not respond properly to insulin that it makes (insulin resistance).  Insulin resistance or lack of insulin causes excess glucose to build up in the blood instead of going into cells. As a result, high blood glucose (hyperglycemia) develops, which can cause many complications. Being overweight or obese and having an inactive (sedentary) lifestyle can increase your risk for diabetes. Type 2 diabetes can be delayed or prevented by making certain nutrition and lifestyle changes.  What nutrition changes can be made?     Eat healthy meals and snacks regularly. Keep a healthy snack with you for when you get hungry between meals, such as fruit or a handful of nuts.   Eat lean meats and proteins that are low in saturated fats, such as chicken, fish, egg whites, and beans. Avoid processed meats.   Eat plenty of fruits and vegetables and plenty of grains that have not been processed (whole grains). It is recommended that you eat:  ? 1?2 cups of fruit every day.  ? 2?3 cups of vegetables every day.  ? 6?8 oz of whole grains every day, such as oats, whole wheat, bulgur, brown rice, quinoa, and millet.   Eat low-fat dairy products, such as milk, yogurt, and cheese.   Eat foods that contain healthy fats, such as nuts, avocado, olive oil, and canola oil.   Drink water throughout the day. Avoid drinks that contain added sugar, such as soda or sweet tea.   Follow instructions from your health care provider about specific eating or drinking restrictions.   Control how much food you eat at a time (portion size).  ? Check food labels  to find out the serving sizes of foods.  ? Use a kitchen scale to weigh amounts of foods.   Saute or steam food instead of frying it. Cook with water or broth instead of oils or butter.   Limit your intake of:  ? Salt (sodium). Have no more than 1 tsp (2,400 mg) of sodium a day. If you have heart disease or high blood pressure, have less than ? tsp (1,500 mg) of sodium a day.  ? Saturated fat. This is fat that is solid at room temperature, such as butter or fat on meat.  What lifestyle changes can be made?  Activity     Do moderate-intensity physical activity for at least 30 minutes on at least 5 days of the week, or as much as told by your health care provider.   Ask your health care provider what activities are safe for you. A mix of physical activities may be best, such as walking, swimming, cycling, and strength training.   Try to add physical activity into your day. For example:  ? Park in spots that are farther away than usual, so that you walk more. For example, park in a far corner of the parking lot when you go to the office or the grocery store.  ? Take a walk during your lunch break.  ? Use stairs instead of elevators or escalators.  Weight Loss   Lose weight as directed. Your health care provider   can determine how much weight loss is best for you and can help you lose weight safely.   If you are overweight or obese, you may be instructed to lose at least 5?7 % of your body weight.  Alcohol and Tobacco     Limit alcohol intake to no more than 1 drink a day for nonpregnant women and 2 drinks a day for men. One drink equals 12 oz of beer, 5 oz of wine, or 1 oz of hard liquor.   Do not use any tobacco products, such as cigarettes, chewing tobacco, and e-cigarettes. If you need help quitting, ask your health care provider.  Work With Your Health Care Provider   Have your blood glucose tested regularly, as told by your health care provider.   Discuss your risk factors and how you can reduce your  risk for diabetes.   Get screening tests as told by your health care provider. You may have screening tests regularly, especially if you have certain risk factors for type 2 diabetes.   Make an appointment with a diet and nutrition specialist (registered dietitian). A registered dietitian can help you make a healthy eating plan and can help you understand portion sizes and food labels.  Why are these changes important?   It is possible to prevent or delay type 2 diabetes and related health problems by making lifestyle and nutrition changes.   It can be difficult to recognize signs of type 2 diabetes. The best way to avoid possible damage to your body is to take actions to prevent the disease before you develop symptoms.  What can happen if changes are not made?   Your blood glucose levels may keep increasing. Having high blood glucose for a long time is dangerous. Too much glucose in your blood can damage your blood vessels, heart, kidneys, nerves, and eyes.   You may develop prediabetes or type 2 diabetes. Type 2 diabetes can lead to many chronic health problems and complications, such as:  ? Heart disease.  ? Stroke.  ? Blindness.  ? Kidney disease.  ? Depression.  ? Poor circulation in the feet and legs, which could lead to surgical removal (amputation) in severe cases.  Where to find support   Ask your health care provider to recommend a registered dietitian, diabetes educator, or weight loss program.   Look for local or online weight loss groups.   Join a gym, fitness club, or outdoor activity group, such as a walking club.  Where to find more information  To learn more about diabetes and diabetes prevention, visit:   American Diabetes Association (ADA): www.diabetes.org   National Institute of Diabetes and Digestive and Kidney Diseases: www.niddk.nih.gov/health-information/diabetes  To learn more about healthy eating, visit:   The U.S. Department of Agriculture (USDA), Choose My Plate:  www.choosemyplate.gov/food-groups   Office of Disease Prevention and Health Promotion (ODPHP), Dietary Guidelines: www.health.gov/dietaryguidelines  Summary   You can reduce your risk for type 2 diabetes by increasing your physical activity, eating healthy foods, and losing weight as directed.   Talk with your health care provider about your risk for type 2 diabetes. Ask about any blood tests or screening tests that you need to have.  This information is not intended to replace advice given to you by your health care provider. Make sure you discuss any questions you have with your health care provider.  Document Released: 07/27/2015 Document Revised: 03/16/2017 Document Reviewed: 05/26/2015  Elsevier Interactive Patient Education  2019 Elsevier Inc.      Prediabetes Eating Plan  Prediabetes is a condition that causes blood sugar (glucose) levels to be higher than normal. This increases the risk for developing diabetes. In order to prevent diabetes from developing, your health care provider may recommend a diet and other lifestyle changes to help you:   Control your blood glucose levels.   Improve your cholesterol levels.   Manage your blood pressure.  Your health care provider may recommend working with a diet and nutrition specialist (dietitian) to make a meal plan that is best for you.  What are tips for following this plan?  Lifestyle   Set weight loss goals with the help of your health care team. It is recommended that most people with prediabetes lose 7% of their current body weight.   Exercise for at least 30 minutes at least 5 days a week.   Attend a support group or seek ongoing support from a mental health counselor.   Take over-the-counter and prescription medicines only as told by your health care provider.  Reading food labels   Read food labels to check the amount of fat, salt (sodium), and sugar in prepackaged foods. Avoid foods that have:  ? Saturated fats.  ? Trans fats.  ? Added  sugars.   Avoid foods that have more than 300 milligrams (mg) of sodium per serving. Limit your daily sodium intake to less than 2,300 mg each day.  Shopping   Avoid buying pre-made and processed foods.  Cooking   Cook with olive oil. Do not use butter, lard, or ghee.   Bake, broil, grill, or boil foods. Avoid frying.  Meal planning     Work with your dietitian to develop an eating plan that is right for you. This may include:  ? Tracking how many calories you take in. Use a food diary, notebook, or mobile application to track what you eat at each meal.  ? Using the glycemic index (GI) to plan your meals. The index tells you how quickly a food will raise your blood glucose. Choose low-GI foods. These foods take a longer time to raise blood glucose.   Consider following a Mediterranean diet. This diet includes:  ? Several servings each day of fresh fruits and vegetables.  ? Eating fish at least twice a week.  ? Several servings each day of whole grains, beans, nuts, and seeds.  ? Using olive oil instead of other fats.  ? Moderate alcohol consumption.  ? Eating small amounts of red meat and whole-fat dairy.   If you have high blood pressure, you may need to limit your sodium intake or follow a diet such as the DASH eating plan. DASH is an eating plan that aims to lower high blood pressure.  What foods are recommended?  The items listed below may not be a complete list. Talk with your dietitian about what dietary choices are best for you.  Grains  Whole grains, such as whole-wheat or whole-grain breads, crackers, cereals, and pasta. Unsweetened oatmeal. Bulgur. Barley. Quinoa. Brown rice. Corn or whole-wheat flour tortillas or taco shells.  Vegetables  Lettuce. Spinach. Peas. Beets. Cauliflower. Cabbage. Broccoli. Carrots. Tomatoes. Squash. Eggplant. Herbs. Peppers. Onions. Cucumbers. Brussels sprouts.  Fruits  Berries. Bananas. Apples. Oranges. Grapes. Papaya. Mango. Pomegranate. Kiwi. Grapefruit.  Cherries.  Meats and other protein foods  Seafood. Poultry without skin. Lean cuts of pork and beef. Tofu. Eggs. Nuts. Beans.  Dairy  Low-fat or fat-free dairy products, such as yogurt, cottage cheese, and cheese.  Beverages    Water. Tea. Coffee. Sugar-free or diet soda. Seltzer water. Lowfat or no-fat milk. Milk alternatives, such as soy or almond milk.  Fats and oils  Olive oil. Canola oil. Sunflower oil. Grapeseed oil. Avocado. Walnuts.  Sweets and desserts  Sugar-free or low-fat pudding. Sugar-free or low-fat ice cream and other frozen treats.  Seasoning and other foods  Herbs. Sodium-free spices. Mustard. Relish. Low-fat, low-sugar ketchup. Low-fat, low-sugar barbecue sauce. Low-fat or fat-free mayonnaise.  What foods are not recommended?  The items listed below may not be a complete list. Talk with your dietitian about what dietary choices are best for you.  Grains  Refined white flour and flour products, such as bread, pasta, snack foods, and cereals.  Vegetables  Canned vegetables. Frozen vegetables with butter or cream sauce.  Fruits  Fruits canned with syrup.  Meats and other protein foods  Fatty cuts of meat. Poultry with skin. Breaded or fried meat. Processed meats.  Dairy  Full-fat yogurt, cheese, or milk.  Beverages  Sweetened drinks, such as sweet iced tea and soda.  Fats and oils  Butter. Lard. Ghee.  Sweets and desserts  Baked goods, such as cake, cupcakes, pastries, cookies, and cheesecake.  Seasoning and other foods  Spice mixes with added salt. Ketchup. Barbecue sauce. Mayonnaise.  Summary   To prevent diabetes from developing, you may need to make diet and other lifestyle changes to help control blood sugar, improve cholesterol levels, and manage your blood pressure.   Set weight loss goals with the help of your health care team. It is recommended that most people with prediabetes lose 7 percent of their current body weight.   Consider following a Mediterranean diet that includes plenty of  fresh fruits and vegetables, whole grains, beans, nuts, seeds, fish, lean meat, low-fat dairy, and healthy oils.  This information is not intended to replace advice given to you by your health care provider. Make sure you discuss any questions you have with your health care provider.  Document Released: 08/19/2014 Document Revised: 06/08/2016 Document Reviewed: 06/08/2016  Elsevier Interactive Patient Education  2019 Elsevier Inc.

## 2018-09-19 LAB — CBC WITH DIFFERENTIAL/PLATELET
Basophils Absolute: 0 10*3/uL (ref 0.0–0.2)
Basos: 1 %
EOS (ABSOLUTE): 0.2 10*3/uL (ref 0.0–0.4)
Eos: 6 %
Hematocrit: 45.3 % (ref 37.5–51.0)
Hemoglobin: 15 g/dL (ref 13.0–17.7)
Immature Grans (Abs): 0 10*3/uL (ref 0.0–0.1)
Immature Granulocytes: 0 %
Lymphocytes Absolute: 1.2 10*3/uL (ref 0.7–3.1)
Lymphs: 32 %
MCH: 29.5 pg (ref 26.6–33.0)
MCHC: 33.1 g/dL (ref 31.5–35.7)
MCV: 89 fL (ref 79–97)
Monocytes Absolute: 0.3 10*3/uL (ref 0.1–0.9)
Monocytes: 8 %
Neutrophils Absolute: 2 10*3/uL (ref 1.4–7.0)
Neutrophils: 53 %
Platelets: 219 10*3/uL (ref 150–450)
RBC: 5.08 x10E6/uL (ref 4.14–5.80)
RDW: 12.6 % (ref 11.6–15.4)
WBC: 3.7 10*3/uL (ref 3.4–10.8)

## 2018-09-19 LAB — COMPREHENSIVE METABOLIC PANEL
ALT: 40 IU/L (ref 0–44)
AST: 25 IU/L (ref 0–40)
Albumin/Globulin Ratio: 2.6 — ABNORMAL HIGH (ref 1.2–2.2)
Albumin: 5 g/dL — ABNORMAL HIGH (ref 3.8–4.9)
Alkaline Phosphatase: 74 IU/L (ref 39–117)
BUN/Creatinine Ratio: 18 (ref 9–20)
BUN: 20 mg/dL (ref 6–24)
Bilirubin Total: 0.8 mg/dL (ref 0.0–1.2)
CO2: 20 mmol/L (ref 20–29)
Calcium: 9.5 mg/dL (ref 8.7–10.2)
Chloride: 103 mmol/L (ref 96–106)
Creatinine, Ser: 1.11 mg/dL (ref 0.76–1.27)
GFR calc Af Amer: 87 mL/min/{1.73_m2} (ref >59)
GFR calc non Af Amer: 75 mL/min/{1.73_m2} (ref >59)
Globulin, Total: 1.9 g/dL (ref 1.5–4.5)
Glucose: 118 mg/dL — ABNORMAL HIGH (ref 65–99)
Potassium: 3.9 mmol/L (ref 3.5–5.2)
Sodium: 142 mmol/L (ref 134–144)
Total Protein: 6.9 g/dL (ref 6.0–8.5)

## 2018-09-19 LAB — LIPID PANEL
Chol/HDL Ratio: 3.5 ratio (ref 0.0–5.0)
Cholesterol, Total: 153 mg/dL (ref 100–199)
HDL: 44 mg/dL (ref >39)
LDL Calculated: 95 mg/dL (ref 0–99)
Triglycerides: 71 mg/dL (ref 0–149)
VLDL Cholesterol Cal: 14 mg/dL (ref 5–40)

## 2018-09-19 LAB — VITAMIN D 25 HYDROXY (VIT D DEFICIENCY, FRACTURES): Vit D, 25-Hydroxy: 35.4 ng/mL (ref 30.0–100.0)

## 2018-09-19 LAB — VITAMIN B12: Vitamin B-12: 510 pg/mL (ref 232–1245)

## 2018-09-19 LAB — TSH: TSH: 0.771 u[IU]/mL (ref 0.450–4.500)

## 2018-09-24 ENCOUNTER — Telehealth: Payer: Self-pay

## 2018-09-24 NOTE — Telephone Encounter (Signed)
-----   Message from Azzie Glatter, FNP sent at 09/24/2018 11:49 AM EDT ----- All labs are stable. Please inform patient.

## 2018-09-24 NOTE — Telephone Encounter (Signed)
Called and spoke with patient, advised that all labs were stable and asked that he keep next scheduled appointment. Thanks !

## 2018-10-16 MED FILL — ?METOPROLOL 50 MG TABLET: 50 | 30 days supply | Qty: 60 | Fill #1

## 2018-10-16 MED FILL — ?ATORVASTATIN 40MG TABLET: 40 | 30 days supply | Qty: 60 | Fill #0

## 2018-10-16 MED FILL — ?AMLODIPINE BESYLATE 2.5MG.: 2.5 | 30 days supply | Qty: 30 | Fill #0

## 2018-11-19 MED FILL — ?ATORVASTATIN 40MG TABLET: 40 | 30 days supply | Qty: 60 | Fill #1

## 2018-11-19 MED FILL — ?METOPROLOL 50 MG TABLET: 50 | 30 days supply | Qty: 60 | Fill #2

## 2018-11-19 MED FILL — ?AMLODIPINE BESYLATE 2.5MG.: 2.5 | 30 days supply | Qty: 30 | Fill #1

## 2018-12-17 MED FILL — ?METOPROLOL 50 MG TABLET: 50 | 30 days supply | Qty: 60 | Fill #3

## 2018-12-17 MED FILL — ?AMLODIPINE BESYLATE 2.5MG.: 2.5 | 30 days supply | Qty: 30 | Fill #2

## 2018-12-17 MED FILL — ?ATORVASTATIN 40MG TABLET: 40 | 30 days supply | Qty: 60 | Fill #2

## 2018-12-19 ENCOUNTER — Other Ambulatory Visit: Payer: Self-pay

## 2018-12-19 ENCOUNTER — Ambulatory Visit (INDEPENDENT_AMBULATORY_CARE_PROVIDER_SITE_OTHER): Payer: Self-pay | Admitting: Family Medicine

## 2018-12-19 DIAGNOSIS — Z09 Encounter for follow-up examination after completed treatment for conditions other than malignant neoplasm: Secondary | ICD-10-CM

## 2018-12-19 DIAGNOSIS — Z8673 Personal history of transient ischemic attack (TIA), and cerebral infarction without residual deficits: Secondary | ICD-10-CM

## 2018-12-19 DIAGNOSIS — R531 Weakness: Secondary | ICD-10-CM

## 2018-12-19 DIAGNOSIS — I10 Essential (primary) hypertension: Secondary | ICD-10-CM

## 2018-12-19 NOTE — Progress Notes (Signed)
Patient [facility]   Established Patient Office Visit  Subjective:  Patient ID: Brad Tran, male    DOB: [birth year]  Age: 53 y.o. MRN: [id number]   CC: No chief complaint on file.   HPI Brad Tran is a 53 year old male who presents for Follow up today.   Past Medical History:  Diagnosis Date  . Hyperlipidemia   . Hypertension   . Stroke [facility])    Current Status: Since his last office visit, he is doing well with no complaints. He continues to have right-sided residual weakness. He denies visual changes, chest pain, cough, shortness of breath, heart palpitations, and falls. He has occasional headaches and dizziness with position changes. Denies severe headaches, confusion, seizures, double vision, and blurred vision, nausea and vomiting.  He denies fevers, chills, fatigue, recent infections, weight loss, and night sweats. No reports of GI problems such as nausea, vomiting, diarrhea, and constipation. He has no reports of blood in stools, dysuria and hematuria. No depression or anxiety reported. He denies pain today.    No past surgical history on file.  Family History  Problem Relation Age of Onset  . Heart attack Father   . Stroke Father     Social History   Socioeconomic History  . Marital status: Divorced    Spouse name: Not on file  . Number of children: Not on file  . Years of education: Not on file  . Highest education level: Not on file  Occupational History  . Not on file  Social Needs  . Financial resource strain: Not on file  . Food insecurity    Worry: Not on file    Inability: Not on file  . Transportation needs    Medical: Not on file    Non-medical: Not on file  Tobacco Use  . Smoking status: Never Smoker  . Smokeless tobacco: Never Used  Substance and Sexual Activity  . Alcohol use: No  . Drug use: No  . Sexual activity: Not on file  Lifestyle  . Physical activity    Days per week: Not on file   Minutes per session: Not on file  . Stress: Not on file  Relationships  . Social Herbalist on phone: Not on file    Gets together: Not on file    Attends religious service: Not on file    Active member of club or organization: Not on file    Attends meetings of clubs or organizations: Not on file    Relationship status: Not on file  . Intimate partner violence    Fear of current or ex partner: Not on file    Emotionally abused: Not on file    Physically abused: Not on file    Forced sexual activity: Not on file  Other Topics Concern  . Not on file  Social History Narrative  . Not on file    Outpatient Medications Prior to Visit  Medication Sig Dispense Refill  . amLODipine (NORVASC) 2.5 MG tablet Take 1 tablet (2.5 mg total) by mouth daily. 30 tablet 3  . atorvastatin (LIPITOR) 80 MG tablet TAKE 1 TABLET BY MOUTH EVERY DAY AT 6 PM 30 tablet 3  . [facility] ASPIRIN EC 325 MG EC tablet TAKE 1 TABLET BY MOUTH EVERY DAY 100 tablet 0  . metoprolol tartrate (LOPRESSOR) 50 MG tablet Take 1 tablet (50 mg total) by mouth 2 (two) times daily. 60 tablet 3  No facility-administered medications prior to visit.     No Known Allergies  ROS Review of Systems  Constitutional: Negative.   HENT: Negative.   Eyes: Negative.   Respiratory: Negative.   Cardiovascular: Negative.   Gastrointestinal: Negative.   Endocrine: Negative.   Genitourinary: Negative.   Musculoskeletal: Negative.   Skin: Negative.   Allergic/Immunologic: Negative.   Neurological: Positive for dizziness (occasional ), weakness (right-sided residual weakness r/t recent Stroke) and headaches (occasional).  Hematological: Negative.   Psychiatric/Behavioral: Negative.    Objective:    Physical Exam  Constitutional: He is oriented to person, place, and time. He appears well-developed and well-nourished.  HENT:  Head: Normocephalic and atraumatic.  Eyes: Conjunctivae are normal.  Neck: Normal range of motion.  Neck supple.  Cardiovascular: Normal rate, regular rhythm, normal heart sounds and intact distal pulses.  Pulmonary/Chest: Effort normal and breath sounds normal.  Abdominal: Soft. Bowel sounds are normal.  Musculoskeletal: Normal range of motion.  Neurological: He is alert and oriented to person, place, and time. He has normal reflexes.  Skin: Skin is warm and dry.  Psychiatric: He has a normal mood and affect. His behavior is normal. Judgment and thought content normal.  Nursing note and vitals reviewed.   There were no vitals taken for this visit. Wt Readings from Last 3 Encounters:  12/19/18 174 lb 9.6 oz (79.2 kg)  09/18/18 173 lb (78.5 kg)  03/05/18 171 lb 6.4 oz (77.7 kg)     Health Maintenance Due  Topic Date Due  . HIV Screening  07/21/1980  . COLONOSCOPY  07/22/2015  . INFLUENZA VACCINE  11/17/2018    There are no preventive care reminders to display for this patient.  Lab Results  Component Value Date   TSH 0.771 09/18/2018   Lab Results  Component Value Date   WBC 3.7 09/18/2018   HGB 15.0 09/18/2018   HCT 45.3 09/18/2018   MCV 89 09/18/2018   PLT 219 09/18/2018   Lab Results  Component Value Date   NA 142 09/18/2018   K 3.9 09/18/2018   CO2 20 09/18/2018   GLUCOSE 118 (H) 09/18/2018   BUN 20 09/18/2018   CREATININE 1.11 09/18/2018   BILITOT 0.8 09/18/2018   ALKPHOS 74 09/18/2018   AST 25 09/18/2018   ALT 40 09/18/2018   PROT 6.9 09/18/2018   ALBUMIN 5.0 (H) 09/18/2018   CALCIUM 9.5 09/18/2018   ANIONGAP 6 08/27/2016   Lab Results  Component Value Date   CHOL 153 09/18/2018   Lab Results  Component Value Date   HDL 44 09/18/2018   Lab Results  Component Value Date   LDLCALC 95 09/18/2018   Lab Results  Component Value Date   TRIG 71 09/18/2018   Lab Results  Component Value Date   CHOLHDL 3.5 09/18/2018   Lab Results  Component Value Date   HGBA1C 5.8 (A) 09/18/2018   Assessment & Plan:   1. Hypertension, unspecified type  The current medical regimen is effective; blood pressure is stable at 142/74 today; continue present plan and medications as prescribed. He will continue to decrease high sodium intake, excessive alcohol intake, increase potassium intake, smoking cessation, and increase physical activity of at least 30 minutes of cardio activity daily. He will continue to follow Heart Healthy or DASH diet.  2. 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   3. Right sided weakness Stable. No reports of falls reported.   4. Follow up He will follow up in 3 months.  No orders of the defined types were placed in this encounter.   No orders of the defined types were placed in this encounter.   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   Follow-up: No follow-ups on file.    Kallie Locks, FNP

## 2019-01-21 ENCOUNTER — Other Ambulatory Visit: Payer: Self-pay | Admitting: Family Medicine

## 2019-01-21 DIAGNOSIS — I10 Essential (primary) hypertension: Secondary | ICD-10-CM

## 2019-01-21 MED FILL — AMLODIPINE BESYLATE 2.5 MG: 2.5 | 30 days supply | Qty: 30 | Fill #3

## 2019-01-21 MED FILL — ?METOPROLOL 50 MG TABLET: 50 | 30 days supply | Qty: 60 | Fill #0

## 2019-01-21 MED FILL — ?ATORVASTATIN 40MG TABLET: 40 | 30 days supply | Qty: 60 | Fill #0

## 2019-02-18 ENCOUNTER — Other Ambulatory Visit: Payer: Self-pay | Admitting: Family Medicine

## 2019-02-18 DIAGNOSIS — I10 Essential (primary) hypertension: Secondary | ICD-10-CM

## 2019-02-18 MED FILL — ?METOPROLOL 50 MG TABLET: 50 | 30 days supply | Qty: 60 | Fill #1

## 2019-02-18 MED FILL — ?ATORVASTATIN 40MG TABLET: 40 | 30 days supply | Qty: 60 | Fill #0

## 2019-02-18 MED FILL — ?AMLODIPINE BESYLATE 2.5MG.: 2.5 | 30 days supply | Qty: 30 | Fill #0

## 2019-03-20 ENCOUNTER — Ambulatory Visit (INDEPENDENT_AMBULATORY_CARE_PROVIDER_SITE_OTHER): Payer: Self-pay | Admitting: Family Medicine

## 2019-03-20 ENCOUNTER — Other Ambulatory Visit: Payer: Self-pay

## 2019-03-20 ENCOUNTER — Encounter: Payer: Self-pay | Admitting: Family Medicine

## 2019-03-20 VITALS — BP 146/83 | HR 69 | Temp 97.8°F | Ht 63.0 in | Wt 177.0 lb

## 2019-03-20 DIAGNOSIS — Z8673 Personal history of transient ischemic attack (TIA), and cerebral infarction without residual deficits: Secondary | ICD-10-CM | POA: Insufficient documentation

## 2019-03-20 DIAGNOSIS — I10 Essential (primary) hypertension: Secondary | ICD-10-CM

## 2019-03-20 DIAGNOSIS — Z09 Encounter for follow-up examination after completed treatment for conditions other than malignant neoplasm: Secondary | ICD-10-CM

## 2019-03-20 DIAGNOSIS — R262 Difficulty in walking, not elsewhere classified: Secondary | ICD-10-CM | POA: Insufficient documentation

## 2019-03-20 DIAGNOSIS — E7800 Pure hypercholesterolemia, unspecified: Secondary | ICD-10-CM

## 2019-03-20 DIAGNOSIS — R531 Weakness: Secondary | ICD-10-CM

## 2019-03-20 LAB — POCT URINALYSIS DIPSTICK
Bilirubin, UA: NEGATIVE
Blood, UA: NEGATIVE
Glucose, UA: NEGATIVE
Ketones, UA: NEGATIVE
Leukocytes, UA: NEGATIVE
Nitrite, UA: NEGATIVE
Protein, UA: NEGATIVE
Spec Grav, UA: 1.025 (ref 1.010–1.025)
Urobilinogen, UA: 0.2 E.U./dL
pH, UA: 5 (ref 5.0–8.0)

## 2019-03-20 MED ORDER — METOPROLOL TARTRATE 50 MG PO TABS: 50.0000 mg | ORAL_TABLET | Freq: Two times a day (BID) | ORAL | 6 refills | Status: DC

## 2019-03-20 MED ORDER — ATORVASTATIN CALCIUM 40 MG PO TABS: ORAL_TABLET | ORAL | 6 refills | Status: DC

## 2019-03-20 MED ORDER — AMLODIPINE BESYLATE 2.5 MG PO TABS: 2.5000 mg | ORAL_TABLET | Freq: Every day | ORAL | 6 refills | Status: DC

## 2019-03-20 MED FILL — ?METOPROLOL 50 MG TABLET: 50 | 30 days supply | Qty: 60 | Fill #0

## 2019-03-20 MED FILL — ?ATORVASTATIN 40MG TABLET: 40 | 30 days supply | Qty: 60 | Fill #0

## 2019-03-20 MED FILL — ?AMLODIPINE BESYLATE 2.5MG.: 2.5 | 30 days supply | Qty: 30 | Fill #0

## 2019-03-20 NOTE — Progress Notes (Signed)
[facility]   Established Patient Office Visit  Subjective:  Patient ID: Brad Tran, male    DOB: 01/05/1966  Age: 53 y.o. MRN: [id number]  CC:  Chief Complaint  Patient presents with  . Follow-up    3 mth follow up HTN    HPI Brad Tran is a 53 year old male who presents for Follow Up.  Past Medical History:  Diagnosis Date  . Hyperlipidemia   . Hypertension   . Stroke [facility](HCC)    Current Status: Since his last office visit, he is doing well with no complaints.  He continues to have right-sided residual weakness. He denies visual changes, chest pain, cough, shortness of breath, heart palpitations, and falls. He has occasional headaches and dizziness with position changes. Denies severe headaches, confusion, seizures, double vision, and blurred vision, nausea and vomiting. He denies fevers, chills, fatigue, recent infections, weight loss, and night sweats. No reports of GI problems such as  diarrhea, and constipation. He has no reports of blood in stools, dysuria and hematuria. No depression or anxiety reported today. He denies pain today.   History reviewed. No pertinent surgical history.  Family History  Problem Relation Age of Onset  . Heart attack Father   . Stroke Father     Social History   Socioeconomic History  . Marital status: Divorced    Spouse name: Not on file  . Number of children: Not on file  . Years of education: Not on file  . Highest education level: Not on file  Occupational History  . Not on file  Social Needs  . Financial resource strain: Not on file  . Food insecurity    Worry: Not on file    Inability: Not on file  . Transportation needs    Medical: Not on file    Non-medical: Not on file  Tobacco Use  . Smoking status: Never Smoker  . Smokeless tobacco: Never Used  Substance and Sexual Activity  . Alcohol use: No  . Drug use: No  . Sexual activity: Yes  Lifestyle  . Physical activity     Days per week: Not on file    Minutes per session: Not on file  . Stress: Not on file  Relationships  . Social Musicianconnections    Talks on phone: Not on file    Gets together: Not on file    Attends religious service: Not on file    Active member of club or organization: Not on file    Attends meetings of clubs or organizations: Not on file    Relationship status: Not on file  . Intimate partner violence    Fear of current or ex partner: Not on file    Emotionally abused: Not on file    Physically abused: Not on file    Forced sexual activity: Not on file  Other Topics Concern  . Not on file  Social History Narrative  . Not on file    Outpatient Medications Prior to Visit  Medication Sig Dispense Refill  . [facility] ASPIRIN EC 325 MG EC tablet TAKE 1 TABLET BY MOUTH EVERY DAY 100 tablet 0  . amLODipine (NORVASC) 2.5 MG tablet TAKE 1 TABLET (2.5 MG TOTAL) BY MOUTH DAILY. 30 tablet 3  . atorvastatin (LIPITOR) 40 MG tablet TAKE 2 TABLETS BY MOUTH DAILY AT 6 PM 60 tablet 0  . metoprolol tartrate (LOPRESSOR) 50 MG tablet TAKE 1 TABLET (50 MG TOTAL) BY MOUTH  2 (TWO) TIMES DAILY. 60 tablet 3  . atorvastatin (LIPITOR) 80 MG tablet TAKE 1 TABLET BY MOUTH EVERY DAY AT 6 PM 30 tablet 3   No facility-administered medications prior to visit.     No Known Allergies  ROS Review of Systems  Constitutional: Negative.   HENT: Negative.   Eyes: Negative.   Respiratory: Negative.   Cardiovascular: Negative.   Gastrointestinal: Negative.   Endocrine: Negative.   Genitourinary: Negative.   Musculoskeletal: Positive for arthralgias (generalized joint pain).  Skin: Negative.   Allergic/Immunologic: Negative.   Neurological: Positive for dizziness (occasional ), weakness (residual right-sided weakness r/t Stroke) and headaches (occasional ).  Hematological: Negative.   Psychiatric/Behavioral: Negative.       Objective:    Physical Exam  Constitutional: He is oriented to person, place, and  time. He appears well-developed and well-nourished.  HENT:  Head: Normocephalic and atraumatic.  Eyes: Conjunctivae are normal.  Neck: Normal range of motion. Neck supple.  Cardiovascular: Normal rate, regular rhythm, normal heart sounds and intact distal pulses.  Pulmonary/Chest: Effort normal and breath sounds normal.  Abdominal: Soft. Bowel sounds are normal.  Musculoskeletal: Normal range of motion.  Neurological: He is alert and oriented to person, place, and time. He has normal reflexes.  Skin: Skin is warm and dry.  Psychiatric: He has a normal mood and affect. His behavior is normal. Judgment and thought content normal.  Nursing note and vitals reviewed.   BP (!) 146/83   Pulse 69   Temp 97.8 F (36.6 C) (Oral)   Ht 5\' 3"  (1.6 m)   Wt 177 lb (80.3 kg)   SpO2 97%   BMI 31.35 kg/m  Wt Readings from Last 3 Encounters:  03/20/19 177 lb (80.3 kg)  12/19/18 174 lb 9.6 oz (79.2 kg)  09/18/18 173 lb (78.5 kg)     Health Maintenance Due  Topic Date Due  . HIV Screening  07/21/1980  . COLONOSCOPY  07/22/2015  . INFLUENZA VACCINE  11/17/2018    There are no preventive care reminders to display for this patient.  Lab Results  Component Value Date   TSH 0.771 09/18/2018   Lab Results  Component Value Date   WBC 3.7 09/18/2018   HGB 15.0 09/18/2018   HCT 45.3 09/18/2018   MCV 89 09/18/2018   PLT 219 09/18/2018   Lab Results  Component Value Date   NA 142 09/18/2018   K 3.9 09/18/2018   CO2 20 09/18/2018   GLUCOSE 118 (H) 09/18/2018   BUN 20 09/18/2018   CREATININE 1.11 09/18/2018   BILITOT 0.8 09/18/2018   ALKPHOS 74 09/18/2018   AST 25 09/18/2018   ALT 40 09/18/2018   PROT 6.9 09/18/2018   ALBUMIN 5.0 (H) 09/18/2018   CALCIUM 9.5 09/18/2018   ANIONGAP 6 08/27/2016   Lab Results  Component Value Date   CHOL 153 09/18/2018   Lab Results  Component Value Date   HDL 44 09/18/2018   Lab Results  Component Value Date   LDLCALC 95 09/18/2018   Lab  Results  Component Value Date   TRIG 71 09/18/2018   Lab Results  Component Value Date   CHOLHDL 3.5 09/18/2018   Lab Results  Component Value Date   HGBA1C 5.8 (A) 09/18/2018      Assessment & Plan:   1. 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 No signs or symptoms of recurrence.   2. Right sided weakness Stable.    3. Hypertension, unspecified type  The current medical regimen is effective; blood pressure is stable at 146/83 today; continue present plan and medications as prescribed. He will continue to take medications as prescribed, to decrease high sodium intake, excessive alcohol intake, increase potassium intake, smoking cessation, and increase physical activity of at least 30 minutes of cardio activity daily. He will continue to follow Heart Healthy or DASH diet. - Urinalysis Dipstick - amLODipine (NORVASC) 2.5 MG tablet; Take 1 tablet (2.5 mg total) by mouth daily.  Dispense: 30 tablet; Refill: 6 - metoprolol tartrate (LOPRESSOR) 50 MG tablet; Take 1 tablet (50 mg total) by mouth 2 (two) times daily.  Dispense: 60 tablet; Refill: 6  4. Impaired ambulation Stable. Discussed importance of careful ambulation and slowly changing positions.  5. Hypercholesteremia - atorvastatin (LIPITOR) 40 MG tablet; TAKE 2 TABLETS BY MOUTH DAILY AT 6 PM  Dispense: 60 tablet; Refill: 6  6. Follow up He will follow up in 09/2019.   Meds ordered this encounter  Medications  . amLODipine (NORVASC) 2.5 MG tablet    Sig: Take 1 tablet (2.5 mg total) by mouth daily.    Dispense:  30 tablet    Refill:  6  . atorvastatin (LIPITOR) 40 MG tablet    Sig: TAKE 2 TABLETS BY MOUTH DAILY AT 6 PM    Dispense:  60 tablet    Refill:  6  . metoprolol tartrate (LOPRESSOR) 50 MG tablet    Sig: Take 1 tablet (50 mg total) by mouth 2 (two) times daily.    Dispense:  60 tablet    Refill:  6   Orders Placed This Encounter  Procedures  . Urinalysis Dipstick    Problem List Items Addressed  This Visit      Cardiovascular and Mediastinum   Hypertension   Relevant Medications   amLODipine (NORVASC) 2.5 MG tablet   atorvastatin (LIPITOR) 40 MG tablet   metoprolol tartrate (LOPRESSOR) 50 MG tablet   Other Relevant Orders   Urinalysis Dipstick (Completed)    Other Visit Diagnoses    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    -  Primary   Right sided weakness       Impaired ambulation       Hypercholesteremia       Relevant Medications   amLODipine (NORVASC) 2.5 MG tablet   atorvastatin (LIPITOR) 40 MG tablet   metoprolol tartrate (LOPRESSOR) 50 MG tablet   Follow up          Meds ordered this encounter  Medications  . amLODipine (NORVASC) 2.5 MG tablet    Sig: Take 1 tablet (2.5 mg total) by mouth daily.    Dispense:  30 tablet    Refill:  6  . atorvastatin (LIPITOR) 40 MG tablet    Sig: TAKE 2 TABLETS BY MOUTH DAILY AT 6 PM    Dispense:  60 tablet    Refill:  6  . metoprolol tartrate (LOPRESSOR) 50 MG tablet    Sig: Take 1 tablet (50 mg total) by mouth 2 (two) times daily.    Dispense:  60 tablet    Refill:  6    Follow-up: No follow-ups on file.    Kallie Locks, FNP

## 2019-04-29 MED FILL — ?AMLODIPINE BESYLATE 2.5MG.: 2.5 | 30 days supply | Qty: 30 | Fill #1

## 2019-04-29 MED FILL — ?ATORVASTATIN 40MG TABLET: 40 | 30 days supply | Qty: 60 | Fill #1

## 2019-04-29 MED FILL — ?METOPROLOL 50 MG TABLET: 50 | 30 days supply | Qty: 60 | Fill #1

## 2019-05-30 MED FILL — ?ATORVASTATIN 40MG TABL: 40 | 30 days supply | Qty: 60 | Fill #2

## 2019-05-30 MED FILL — ?AMLODIPINE BESYLATE 2.5MG.: 2.5 | 30 days supply | Qty: 30 | Fill #2

## 2019-05-30 MED FILL — ?METOPROLOL 50 MG TABLET: 50 | 30 days supply | Qty: 60 | Fill #2

## 2019-07-01 MED FILL — ?ATORVASTATIN 40MG TABLET: 40 | 30 days supply | Qty: 60 | Fill #3

## 2019-07-01 MED FILL — AMLODIPINE BESYLATE 2.5 MG: 2.5 | 30 days supply | Qty: 30 | Fill #3

## 2019-07-01 MED FILL — ?METOPROLOL TART 50 MG TABL: 50 | 30 days supply | Qty: 60 | Fill #3

## 2019-08-09 MED FILL — ?ATORVASTATIN 40MG TABLET: 40 | 30 days supply | Qty: 60 | Fill #4

## 2019-08-09 MED FILL — AMLODIPINE 2.5 MG TABLET: 2.5 | 30 days supply | Qty: 30 | Fill #4

## 2019-08-09 MED FILL — ?METOPROLOL TART 50 MG TABL: 50 | 30 days supply | Qty: 60 | Fill #4

## 2019-09-13 MED FILL — ?METOPROLOL TART 50 MG TABL: 50 | 30 days supply | Qty: 60 | Fill #5

## 2019-09-13 MED FILL — ?AMLODIPINE BESYLATE 2.5MG.: 2.5 | 30 days supply | Qty: 30 | Fill #5

## 2019-09-13 MED FILL — ?ATORVASTATIN 40MG TABLET: 40 | 30 days supply | Qty: 60 | Fill #5

## 2019-09-18 ENCOUNTER — Ambulatory Visit: Payer: Self-pay | Admitting: Family Medicine

## 2019-10-02 ENCOUNTER — Ambulatory Visit: Payer: Self-pay | Admitting: Family Medicine

## 2019-10-16 MED FILL — ?METOPROLOL TART 50 MG TABL: 50 | 30 days supply | Qty: 60 | Fill #6

## 2019-10-16 MED FILL — ?ATORVASTATIN 40MG TABLET: 40 | 30 days supply | Qty: 60 | Fill #6

## 2019-10-16 MED FILL — ?AMLODIPINE BESYLATE 2.5MG.: 2.5 | 30 days supply | Qty: 30 | Fill #6

## 2019-10-23 ENCOUNTER — Ambulatory Visit (INDEPENDENT_AMBULATORY_CARE_PROVIDER_SITE_OTHER): Payer: Self-pay | Admitting: Family Medicine

## 2019-10-23 ENCOUNTER — Other Ambulatory Visit: Payer: Self-pay

## 2019-10-23 ENCOUNTER — Encounter: Payer: Self-pay | Admitting: Family Medicine

## 2019-10-23 VITALS — BP 162/94 | HR 75 | Temp 97.3°F | Resp 16 | Ht 63.0 in | Wt 181.8 lb

## 2019-10-23 DIAGNOSIS — R531 Weakness: Secondary | ICD-10-CM

## 2019-10-23 DIAGNOSIS — Z8673 Personal history of transient ischemic attack (TIA), and cerebral infarction without residual deficits: Secondary | ICD-10-CM

## 2019-10-23 DIAGNOSIS — I10 Essential (primary) hypertension: Secondary | ICD-10-CM

## 2019-10-23 DIAGNOSIS — R262 Difficulty in walking, not elsewhere classified: Secondary | ICD-10-CM

## 2019-10-23 DIAGNOSIS — Z09 Encounter for follow-up examination after completed treatment for conditions other than malignant neoplasm: Secondary | ICD-10-CM

## 2019-10-23 DIAGNOSIS — E7800 Pure hypercholesterolemia, unspecified: Secondary | ICD-10-CM

## 2019-10-23 DIAGNOSIS — Z0000 Encounter for general adult medical examination without abnormal findings: Secondary | ICD-10-CM

## 2019-10-23 MED ORDER — AMLODIPINE BESYLATE 5 MG PO TABS: 5.0000 mg | ORAL_TABLET | Freq: Every day | ORAL | 3 refills | Status: DC

## 2019-10-23 NOTE — Progress Notes (Signed)
[facility]   Established Patient Office Visit  Subjective:  Patient ID: Brad Tran, male    DOB: 10/24/1965  Age: 54 y.o. MRN: [id number]  CC:  Chief Complaint  Patient presents with  . Follow-up    Pt states he is here for his 6 month f/u. Pt states he has no question or concerns he would like to dicuss with the provider today.     HPI Brad Tran presents is a 54 year old male who presents for Follow Up today.   Past Medical History:  Diagnosis Date  . Hyperlipidemia   . Hypertension   . Impaired ambulation   . Right sided weakness   . Stroke [facility](HCC)    Current Status: Since his last office visit, he is doing well with no complaints. His blood pressures are elevated today. He states that he is taking all medications a prescribed and blood pressure readings at home are 140s/80s. He states that he is now taking a medication for blood pressure called: PBS-5 OTC supplement for blood pressure control. He has taken this medication X 2 weeks now. He denies visual changes, chest pain, cough, shortness of breath, heart palpitations, and falls. He has occasional headaches and dizziness with position changes. Denies severe headaches, confusion, seizures, double vision, and blurred vision, nausea and vomiting. He denies fevers, chills, fatigue, recent infections, weight loss, and night sweats. He has not had any headaches, visual changes, dizziness, and falls. No chest pain, heart palpitations, cough and shortness of breath reported. Denies GI problems such as diarrhea, and constipation. He has no reports of blood in stools, dysuria and hematuria. No depression or anxiety reported today. He is taking all medications as prescribed. He denies pain today.   No past surgical history on file.  Family History  Problem Relation Age of Onset  . Heart attack Father   . Stroke Father     Social History   Socioeconomic History  . Marital status:  Divorced    Spouse name: Not on file  . Number of children: Not on file  . Years of education: Not on file  . Highest education level: Not on file  Occupational History  . Not on file  Tobacco Use  . Smoking status: Never Smoker  . Smokeless tobacco: Never Used  Vaping Use  . Vaping Use: Never used  Substance and Sexual Activity  . Alcohol use: No  . Drug use: No  . Sexual activity: Yes  Other Topics Concern  . Not on file  Social History Narrative  . Not on file   Social Determinants of Health   Financial Resource Strain:   . Difficulty of Paying Living Expenses:   Food Insecurity:   . Worried About Programme researcher, broadcasting/film/videounning Out of Food in the Last Year:   . Baristaan Out of Food in the Last Year:   Transportation Needs:   . Freight forwarderLack of Transportation (Medical):   Marland Kitchen. Lack of Transportation (Non-Medical):   Physical Activity:   . Days of Exercise per Week:   . Minutes of Exercise per Session:   Stress:   . Feeling of Stress :   Social Connections:   . Frequency of Communication with Friends and Family:   . Frequency of Social Gatherings with Friends and Family:   . Attends Religious Services:   . Active Member of Clubs or Organizations:   . Attends BankerClub or Organization Meetings:   Marland Kitchen. Marital Status:   Intimate  Partner Violence:   . Fear of Current or Ex-Partner:   . Emotionally Abused:   Marland Kitchen Physically Abused:   . Sexually Abused:     Outpatient Medications Prior to Visit  Medication Sig Dispense Refill  . atorvastatin (LIPITOR) 40 MG tablet TAKE 2 TABLETS BY MOUTH DAILY AT 6 PM 60 tablet 6  . [facility] ASPIRIN EC 325 MG EC tablet TAKE 1 TABLET BY MOUTH EVERY DAY 100 tablet 0  . metoprolol tartrate (LOPRESSOR) 50 MG tablet Take 1 tablet (50 mg total) by mouth 2 (two) times daily. 60 tablet 6  . amLODipine (NORVASC) 2.5 MG tablet Take 1 tablet (2.5 mg total) by mouth daily. 30 tablet 6   No facility-administered medications prior to visit.    No Known Allergies  ROS Review of Systems    Constitutional: Negative.   HENT: Negative.   Eyes: Negative.   Respiratory: Negative.   Cardiovascular: Negative.   Gastrointestinal: Positive for abdominal distention.  Endocrine: Negative.   Genitourinary: Negative.   Musculoskeletal: Positive for arthralgias (generalized joint pain).  Skin: Negative.   Allergic/Immunologic: Negative.   Neurological: Positive for dizziness (occasional).  Hematological: Negative.   Psychiatric/Behavioral: Negative.       Objective:    Physical Exam Vitals and nursing note reviewed.  Constitutional:      Appearance: Normal appearance.  HENT:     Head: Normocephalic and atraumatic.     Nose: Nose normal.     Mouth/Throat:     Mouth: Mucous membranes are moist.     Pharynx: Oropharynx is clear.  Cardiovascular:     Rate and Rhythm: Normal rate and regular rhythm.     Pulses: Normal pulses.     Heart sounds: Normal heart sounds.  Pulmonary:     Effort: Pulmonary effort is normal.     Breath sounds: Normal breath sounds.  Abdominal:     General: Bowel sounds are normal.     Palpations: Abdomen is soft.  Musculoskeletal:        General: Normal range of motion.     Cervical back: Normal range of motion and neck supple.  Skin:    General: Skin is warm.  Neurological:     General: No focal deficit present.     Mental Status: He is alert and oriented to person, place, and time.     Motor: Weakness (right extremity weakness) present.  Psychiatric:        Mood and Affect: Mood normal.        Behavior: Behavior normal.        Thought Content: Thought content normal.        Judgment: Judgment normal.     BP (!) 162/94 (BP Location: Left Arm, Patient Position: Sitting, Cuff Size: Normal)   Pulse 75   Temp (!) 97.3 F (36.3 C)   Resp 16   Ht 5\' 3"  (1.6 m)   Wt 181 lb 12.8 oz (82.5 kg)   SpO2 99%   BMI 32.20 kg/m  Wt Readings from Last 3 Encounters:  10/23/19 181 lb 12.8 oz (82.5 kg)  03/20/19 177 lb (80.3 kg)  12/19/18 174 lb  9.6 oz (79.2 kg)     Health Maintenance Due  Topic Date Due  . Hepatitis C Screening  Never done  . COVID-19 Vaccine (1) Never done  . HIV Screening  Never done  . COLONOSCOPY  Never done    There are no preventive care reminders to display for this patient.  Lab  Results  Component Value Date   TSH 0.771 09/18/2018   Lab Results  Component Value Date   WBC 3.7 09/18/2018   HGB 15.0 09/18/2018   HCT 45.3 09/18/2018   MCV 89 09/18/2018   PLT 219 09/18/2018   Lab Results  Component Value Date   NA 142 09/18/2018   K 3.9 09/18/2018   CO2 20 09/18/2018   GLUCOSE 118 (H) 09/18/2018   BUN 20 09/18/2018   CREATININE 1.11 09/18/2018   BILITOT 0.8 09/18/2018   ALKPHOS 74 09/18/2018   AST 25 09/18/2018   ALT 40 09/18/2018   PROT 6.9 09/18/2018   ALBUMIN 5.0 (H) 09/18/2018   CALCIUM 9.5 09/18/2018   ANIONGAP 6 08/27/2016   Lab Results  Component Value Date   CHOL 153 09/18/2018   Lab Results  Component Value Date   HDL 44 09/18/2018   Lab Results  Component Value Date   LDLCALC 95 09/18/2018   Lab Results  Component Value Date   TRIG 71 09/18/2018   Lab Results  Component Value Date   CHOLHDL 3.5 09/18/2018   Lab Results  Component Value Date   HGBA1C 5.8 (A) 09/18/2018      Assessment & Plan:   1. Hypertension, unspecified type She will continue to take medications as prescribed, to decrease high sodium intake, excessive alcohol intake, increase potassium intake, smoking cessation, and increase physical activity of at least 30 minutes of cardio activity daily. She will continue to follow Heart Healthy or DASH diet. - amLODipine (NORVASC) 5 MG tablet; Take 1 tablet (5 mg total) by mouth daily.  Dispense: 90 tablet; Refill: 3  2. History of stroke No signs or symptom of recurrence noted or reported.   3. Right sided weakness Stable.   4. Impaired ambulation  5. Hypercholesteremia  6. Healthcare maintenance - CBC with Differential -  Comprehensive metabolic panel - Lipid Panel - TSH - Vitamin B12 - Vitamin D, 25-hydroxy  7. Follow up He will follow up in 6 months.   Meds ordered this encounter  Medications  . amLODipine (NORVASC) 5 MG tablet    Sig: Take 1 tablet (5 mg total) by mouth daily.    Dispense:  90 tablet    Refill:  3    Orders Placed This Encounter  Procedures  . CBC with Differential  . Comprehensive metabolic panel  . Lipid Panel  . TSH  . Vitamin B12  . 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 amLODipine (NORVASC) 5 MG tablet    Sig: Take 1 tablet (5 mg total) by mouth daily.    Dispense:  90 tablet    Refill:  3    Follow-up: Return in about 6 months (around 04/24/2020).    Kallie Locks, FNP

## 2019-10-24 LAB — POCT URINALYSIS DIPSTICK
Bilirubin, UA: NEGATIVE
Blood, UA: NEGATIVE
Glucose, UA: NEGATIVE
Ketones, UA: NEGATIVE
Leukocytes, UA: NEGATIVE
Nitrite, UA: NEGATIVE
Protein, UA: NEGATIVE
Spec Grav, UA: 1.025 (ref 1.010–1.025)
Urobilinogen, UA: 2 E.U./dL — AB
pH, UA: 6.5 (ref 5.0–8.0)

## 2019-10-24 LAB — LIPID PANEL
Chol/HDL Ratio: 4 ratio (ref 0.0–5.0)
Cholesterol, Total: 155 mg/dL (ref 100–199)
HDL: 39 mg/dL — ABNORMAL LOW (ref >39)
LDL Chol Calc (NIH): 85 mg/dL (ref 0–99)
Triglycerides: 179 mg/dL — ABNORMAL HIGH (ref 0–149)
VLDL Cholesterol Cal: 31 mg/dL (ref 5–40)

## 2019-10-24 LAB — CBC WITH DIFFERENTIAL/PLATELET
Basophils Absolute: 0 10*3/uL (ref 0.0–0.2)
Basos: 1 %
EOS (ABSOLUTE): 0.3 10*3/uL (ref 0.0–0.4)
Eos: 6 %
Hematocrit: 46.4 % (ref 37.5–51.0)
Hemoglobin: 15.1 g/dL (ref 13.0–17.7)
Immature Grans (Abs): 0 10*3/uL (ref 0.0–0.1)
Immature Granulocytes: 0 %
Lymphocytes Absolute: 1.3 10*3/uL (ref 0.7–3.1)
Lymphs: 28 %
MCH: 29.1 pg (ref 26.6–33.0)
MCHC: 32.5 g/dL (ref 31.5–35.7)
MCV: 89 fL (ref 79–97)
Monocytes Absolute: 0.4 10*3/uL (ref 0.1–0.9)
Monocytes: 8 %
Neutrophils Absolute: 2.6 10*3/uL (ref 1.4–7.0)
Neutrophils: 57 %
Platelets: 196 10*3/uL (ref 150–450)
RBC: 5.19 x10E6/uL (ref 4.14–5.80)
RDW: 13.2 % (ref 11.6–15.4)
WBC: 4.6 10*3/uL (ref 3.4–10.8)

## 2019-10-24 LAB — TSH: TSH: 0.925 u[IU]/mL (ref 0.450–4.500)

## 2019-10-24 LAB — COMPREHENSIVE METABOLIC PANEL
ALT: 66 IU/L — ABNORMAL HIGH (ref 0–44)
AST: 31 IU/L (ref 0–40)
Albumin/Globulin Ratio: 2.5 — ABNORMAL HIGH (ref 1.2–2.2)
Albumin: 4.8 g/dL (ref 3.8–4.9)
Alkaline Phosphatase: 91 IU/L (ref 48–121)
BUN/Creatinine Ratio: 18 (ref 9–20)
BUN: 24 mg/dL (ref 6–24)
Bilirubin Total: 0.5 mg/dL (ref 0.0–1.2)
CO2: 21 mmol/L (ref 20–29)
Calcium: 9.3 mg/dL (ref 8.7–10.2)
Chloride: 105 mmol/L (ref 96–106)
Creatinine, Ser: 1.31 mg/dL — ABNORMAL HIGH (ref 0.76–1.27)
GFR calc Af Amer: 71 mL/min/{1.73_m2} (ref >59)
GFR calc non Af Amer: 61 mL/min/{1.73_m2} (ref >59)
Globulin, Total: 1.9 g/dL (ref 1.5–4.5)
Glucose: 139 mg/dL — ABNORMAL HIGH (ref 65–99)
Potassium: 3.9 mmol/L (ref 3.5–5.2)
Sodium: 140 mmol/L (ref 134–144)
Total Protein: 6.7 g/dL (ref 6.0–8.5)

## 2019-10-24 LAB — VITAMIN B12: Vitamin B-12: 453 pg/mL (ref 232–1245)

## 2019-10-24 LAB — VITAMIN D 25 HYDROXY (VIT D DEFICIENCY, FRACTURES): Vit D, 25-Hydroxy: 24.3 ng/mL — ABNORMAL LOW (ref 30.0–100.0)

## 2019-10-24 NOTE — Addendum Note (Signed)
Addended by: Lovenia Shuck C on: 10/24/2019 11:55 AM   Modules accepted: Orders

## 2019-11-04 ENCOUNTER — Other Ambulatory Visit: Payer: Self-pay | Admitting: Family Medicine

## 2019-11-04 DIAGNOSIS — E7800 Pure hypercholesterolemia, unspecified: Secondary | ICD-10-CM

## 2019-11-04 MED FILL — ?AMLODIPINE BESYLATE 5MG TA: 5 | 30 days supply | Qty: 30 | Fill #0

## 2019-11-11 MED FILL — METOPROLOL TARTRATE 50 MG T: 50 | 30 days supply | Qty: 60 | Fill #2

## 2019-11-11 MED FILL — AMLODIPINE 2.5 MG TABLET: 2.5 | 30 days supply | Qty: 30 | Fill #1

## 2019-11-25 MED FILL — METOPROLOL TARTRATE 50 MG T: 50 | 30 days supply | Qty: 60 | Fill #2

## 2019-12-17 MED FILL — AMLODIPINE 2.5 MG TABLET: 2.5 | 30 days supply | Qty: 30 | Fill #1

## 2020-01-10 MED FILL — AMLODIPINE 2.5 MG TABLET: 2.5 | 30 days supply | Qty: 30 | Fill #2

## 2020-01-10 MED FILL — METOPROLOL TARTRATE 50 MG T: 50 | 30 days supply | Qty: 60 | Fill #3

## 2020-02-11 ENCOUNTER — Other Ambulatory Visit: Payer: Self-pay | Admitting: Family Medicine

## 2020-02-11 ENCOUNTER — Other Ambulatory Visit: Payer: Self-pay | Admitting: Nurse Practitioner

## 2020-02-11 DIAGNOSIS — I10 Essential (primary) hypertension: Secondary | ICD-10-CM

## 2020-02-11 MED FILL — ATORVASTATIN CALCIUM 40 MG: 40 | 30 days supply | Qty: 60 | Fill #0

## 2020-02-11 MED FILL — AMLODIPINE 2.5 MG TABLET: 2.5 | 30 days supply | Qty: 30 | Fill #3

## 2020-02-12 MED FILL — METOPROLOL TARTRATE 50 MG T: 50 | 30 days supply | Qty: 60 | Fill #0

## 2020-03-03 ENCOUNTER — Telehealth: Payer: Self-pay | Admitting: Family Medicine

## 2020-03-03 NOTE — Telephone Encounter (Signed)
Pt was called about awv w/ [facility]. lvm to return call  

## 2020-03-16 ENCOUNTER — Other Ambulatory Visit: Payer: Self-pay | Admitting: Family Medicine

## 2020-03-16 DIAGNOSIS — I10 Essential (primary) hypertension: Secondary | ICD-10-CM

## 2020-03-16 MED FILL — ATORVASTATIN CALCIUM 40 MG: 40 | 30 days supply | Qty: 60 | Fill #1

## 2020-03-16 MED FILL — METOPROLOL TARTRATE 50 MG T: 50 | 30 days supply | Qty: 60 | Fill #1

## 2020-03-20 ENCOUNTER — Other Ambulatory Visit: Payer: Self-pay

## 2020-03-20 ENCOUNTER — Encounter: Payer: Self-pay | Admitting: Family Medicine

## 2020-03-20 ENCOUNTER — Ambulatory Visit (INDEPENDENT_AMBULATORY_CARE_PROVIDER_SITE_OTHER): Payer: 59 | Admitting: Family Medicine

## 2020-03-20 VITALS — BP 137/72 | HR 58 | Temp 98.1°F | Ht 62.24 in | Wt 180.0 lb

## 2020-03-20 DIAGNOSIS — I10 Essential (primary) hypertension: Secondary | ICD-10-CM

## 2020-03-20 DIAGNOSIS — R7303 Prediabetes: Secondary | ICD-10-CM

## 2020-03-20 MED ORDER — AMLODIPINE BESYLATE 5 MG PO TABS: 5.0000 mg | ORAL_TABLET | Freq: Every day | ORAL | 3 refills | Status: DC

## 2020-03-23 MED FILL — AMLODIPINE BESYLATE 5 MG TA: 5 | 30 days supply | Qty: 30 | Fill #0

## 2020-04-20 MED FILL — ATORVASTATIN 40 MG TABLET: 40 | 90 days supply | Qty: 180 | Fill #0

## 2020-04-20 MED FILL — METOPROLOL TARTRATE 50 MG T: 50 | 60 days supply | Qty: 120 | Fill #0

## 2020-04-20 MED FILL — AMLODIPINE BESYLATE 5 MG TA: 5 | 90 days supply | Qty: 90 | Fill #0

## 2020-04-28 ENCOUNTER — Ambulatory Visit: Payer: Self-pay | Admitting: Family Medicine

## 2020-04-28 ENCOUNTER — Encounter: Payer: Self-pay | Admitting: Family Medicine

## 2020-06-16 DIAGNOSIS — E559 Vitamin D deficiency, unspecified: Secondary | ICD-10-CM

## 2020-06-16 HISTORY — DX: Vitamin D deficiency, unspecified: E55.9

## 2020-07-07 ENCOUNTER — Other Ambulatory Visit: Payer: Self-pay

## 2020-07-07 ENCOUNTER — Ambulatory Visit (INDEPENDENT_AMBULATORY_CARE_PROVIDER_SITE_OTHER): Payer: 59 | Admitting: Family Medicine

## 2020-07-07 ENCOUNTER — Encounter: Payer: Self-pay | Admitting: Family Medicine

## 2020-07-07 ENCOUNTER — Other Ambulatory Visit: Payer: Self-pay | Admitting: Family Medicine

## 2020-07-07 VITALS — BP 147/87 | HR 83 | Ht 62.0 in | Wt 182.0 lb

## 2020-07-07 DIAGNOSIS — Z1211 Encounter for screening for malignant neoplasm of colon: Secondary | ICD-10-CM | POA: Diagnosis not present

## 2020-07-07 DIAGNOSIS — Z09 Encounter for follow-up examination after completed treatment for conditions other than malignant neoplasm: Secondary | ICD-10-CM

## 2020-07-07 DIAGNOSIS — I10 Essential (primary) hypertension: Secondary | ICD-10-CM

## 2020-07-07 DIAGNOSIS — R531 Weakness: Secondary | ICD-10-CM

## 2020-07-07 DIAGNOSIS — E7800 Pure hypercholesterolemia, unspecified: Secondary | ICD-10-CM

## 2020-07-07 DIAGNOSIS — Z0000 Encounter for general adult medical examination without abnormal findings: Secondary | ICD-10-CM | POA: Diagnosis not present

## 2020-07-07 DIAGNOSIS — Z8673 Personal history of transient ischemic attack (TIA), and cerebral infarction without residual deficits: Secondary | ICD-10-CM

## 2020-07-07 MED ORDER — AMLODIPINE BESYLATE 5 MG PO TABS
5.0000 mg | ORAL_TABLET | Freq: Every day | ORAL | 3 refills | Status: DC
Start: 2020-07-07 — End: 2020-07-07

## 2020-07-07 MED ORDER — METOPROLOL TARTRATE 50 MG PO TABS: 50.0000 mg | ORAL_TABLET | Freq: Two times a day (BID) | ORAL | 3 refills | Status: DC

## 2020-07-07 MED ORDER — ASPIRIN 325 MG PO TBEC: 325.0000 mg | DELAYED_RELEASE_TABLET | Freq: Every day | ORAL | 3 refills | Status: DC

## 2020-07-07 MED ORDER — ATORVASTATIN CALCIUM 80 MG PO TABS: 80.0000 mg | ORAL_TABLET | Freq: Every day | ORAL | 3 refills | Status: DC

## 2020-07-07 MED FILL — ATORVASTATIN 80 MG TABLET: 80 | 90 days supply | Qty: 90 | Fill #0

## 2020-07-07 MED FILL — METOPROLOL TARTRATE 50 MG T: 50 | 90 days supply | Qty: 180 | Fill #0

## 2020-07-07 NOTE — Patient Instructions (Signed)
An order for Cologuard has been sent to [employer].  You should receive a phone call regarding this request.  They will then mail you the kit and information on use.  If you have any questions or have not heard from them in 2 weeks, feel free to reach out to them: [employer] # [phone] Fax# [phone]   Indications for use Cologuard is intended for the qualitative detection of colorectal neoplasia associated DNA markers and for the presence of occult hemoglobin in human stool. A positive result may indicate the presence of colorectal cancer (CRC) or advanced adenoma (AA) and should be followed by diagnostic colonoscopy. Cologuard is indicated to screen adults of either sex, 47 years or older, who are at typical average risk for CRC. Cologuard is not a replacement for diagnostic colonoscopy or surveillance colonoscopy in high risk individuals.  Important risk information Cologuard should not be used if you:  have a history of colorectal cancer, adenomas or other related cancers had a positive result from another colorectal cancer screening test within the last 6 months have a condition that places you at high risk for colorectal cancer, including Inflammatory Bowel Disease (IBD) or certain hereditary syndromes or a family history of colorectal cancer Talk to your healthcare provider if any of these situations apply to you.  The Cologuard test result should be interpreted with caution. A positive test result does not confirm the presence of cancer. Patients with a positive test result should be referred for diagnostic colonoscopy.  A negative test result does not confirm the absence of cancer. Patients with a negative test result should discuss with their healthcare provider when they need to be tested again.  False positives and false negatives can occur. In a clinical study, 13% of people without cancer received a positive result (false positive) and 8% of people  with cancer received a negative result (false negative). It is important to talk to your doctor about your test results.  Cologuard performance in adults ages 85-49 is estimated based on a large clinical study of patients 73 and older.  Do not provide a sample for Cologuard if you have diarrhea or have blood in your urine or stool.  The risks related to using the Cologuard collection kit are low, with no serious adverse events reported among people in a clinical trial. You should be careful when opening and closing the lids to avoid the risk of hand strain.  Please read the Instructions for Use and talk to your healthcare provider to see if Cologuard is right for you. This product is available by prescription only.  Cologuard colorectal cancer screening test is a Sports administrator.

## 2020-07-07 NOTE — Progress Notes (Signed)
[facility]   Annual Physical  Subjective:  Patient ID: Brad Tran, male    DOB: 02/12/1966  Age: 55 y.o. MRN: [id number]  CC:  Chief Complaint  Patient presents with  . Annual Exam    HPI Brad Tran is a 55 year old male who presents for Annual Physical today.   Patient Active Problem List   Diagnosis Date Noted  . 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 Impaired ambulation 03/20/2019  . Hypertension   . Subacromial impingement of right shoulder 02/26/2016  . Right hemiparesis (HCC)   . Myofascial muscle pain   . 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 Prediabetes   . Stroke (HCC) 10/25/2015  . CVA (cerebral infarction) 10/25/2015  . Stroke (cerebrum) (HCC) 10/25/2015  . Essential hypertension 10/25/2015  . Hyperlipidemia 10/25/2015  . Acute ischemic stroke (HCC)   . Cerebral infarction due to unspecified mechanism   . Dysarthria    Current Status: Since his last office visit, he is doing well with no complaints. He has history of Stroke with residual right-sided weakness noted. He states that he does not use any supportive devices for ambulation.  He denies fevers, chills, fatigue, recent infections, weight loss, and night sweats. He has not had any headaches, visual changes, dizziness, and falls. No chest pain, heart palpitations, cough and shortness of breath reported. Denies GI problems such as nausea, vomiting, diarrhea, and constipation. He has no reports of blood in stools, dysuria and hematuria. No depression or anxiety reported. He is taking all medications as prescribed. He denies pain today.   Past Medical History:  Diagnosis Date  . Hyperlipidemia   . Hypertension   . Impaired ambulation   . Right sided weakness   . Stroke [facility](HCC)     No past surgical history on file.  Family History  Problem Relation Age of Onset  . Heart attack Father   . Stroke  Father     Social History   Socioeconomic History  . Marital status: Divorced    Spouse name: Not on file  . Number of children: Not on file  . Years of education: Not on file  . Highest education level: Not on file  Occupational History  . Not on file  Tobacco Use  . Smoking status: Never Smoker  . Smokeless tobacco: Never Used  Vaping Use  . Vaping Use: Never used  Substance and Sexual Activity  . Alcohol use: No  . Drug use: No  . Sexual activity: Yes  Other Topics Concern  . Not on file  Social History Narrative  . Not on file   Social Determinants of Health   Financial Resource Strain: Not on file  Food Insecurity: Not on file  Transportation Needs: Not on file  Physical Activity: Not on file  Stress: Not on file  Social Connections: Not on file  Intimate Partner Violence: Not on file    Outpatient Medications Prior to Visit  Medication Sig Dispense Refill  . amLODipine (NORVASC) 5 MG tablet Take 1 tablet (5 mg total) by mouth daily. 90 tablet 3  . atorvastatin (LIPITOR) 40 MG tablet TAKE 2 TABLETS BY MOUTH DAILY AT 6 PM 60 tablet 6  . [facility] ASPIRIN EC 325 MG EC tablet TAKE 1 TABLET BY MOUTH EVERY DAY 100 tablet 0  . metoprolol tartrate (LOPRESSOR) 50 MG tablet TAKE 1 TABLET (50 MG TOTAL) BY  MOUTH 2 (TWO) TIMES DAILY. 60 tablet 3   No facility-administered medications prior to visit.    No Known Allergies  ROS Review of Systems  Constitutional: Negative.   HENT: Negative.   Eyes: Negative.   Respiratory: Negative.   Cardiovascular: Negative.   Gastrointestinal: Negative.   Endocrine: Negative.   Genitourinary: Negative.   Musculoskeletal: Positive for arthralgias (generalized joint pain).  Skin: Negative.   Allergic/Immunologic: Negative.   Neurological: Positive for dizziness (occasional ), weakness (right extremity r/t stroke history) and headaches (occasional ).  Psychiatric/Behavioral: Negative.       Objective:    Physical Exam Vitals and  nursing note reviewed.  Constitutional:      Appearance: Normal appearance.  HENT:     Head: Normocephalic and atraumatic.     Nose: Nose normal.     Mouth/Throat:     Mouth: Mucous membranes are moist.     Pharynx: Oropharynx is clear.  Cardiovascular:     Rate and Rhythm: Normal rate and regular rhythm.     Pulses: Normal pulses.     Heart sounds: Normal heart sounds.  Pulmonary:     Effort: Pulmonary effort is normal.     Breath sounds: Normal breath sounds.  Abdominal:     General: Bowel sounds are normal.     Palpations: Abdomen is soft.  Musculoskeletal:     Cervical back: Normal range of motion and neck supple.     Comments: Limited ROM right extremity  Skin:    General: Skin is warm and dry.  Neurological:     General: No focal deficit present.     Mental Status: He is alert.     Gait: Gait abnormal (right extremity weakness).  Psychiatric:        Mood and Affect: Mood normal.        Behavior: Behavior normal.        Thought Content: Thought content normal.        Judgment: Judgment normal.     BP (!) 147/87   Pulse 83   Ht 5\' 2"  (1.575 m)   Wt 182 lb (82.6 kg)   SpO2 100%   BMI 33.29 kg/m  Wt Readings from Last 3 Encounters:  07/07/20 182 lb (82.6 kg)  03/20/20 180 lb (81.6 kg)  10/23/19 181 lb 12.8 oz (82.5 kg)     Health Maintenance Due  Topic Date Due  . COLONOSCOPY (Pts 45-80yrs Insurance coverage will need to be confirmed)  Never done    There are no preventive care reminders to display for this patient.  Lab Results  Component Value Date   TSH 0.925 10/23/2019   Lab Results  Component Value Date   WBC 4.6 10/23/2019   HGB 15.1 10/23/2019   HCT 46.4 10/23/2019   MCV 89 10/23/2019   PLT 196 10/23/2019   Lab Results  Component Value Date   NA 140 10/23/2019   K 3.9 10/23/2019   CO2 21 10/23/2019   GLUCOSE 139 (H) 10/23/2019   BUN 24 10/23/2019   CREATININE 1.31 (H) 10/23/2019   BILITOT 0.5 10/23/2019   ALKPHOS 91 10/23/2019    AST 31 10/23/2019   ALT 66 (H) 10/23/2019   PROT 6.7 10/23/2019   ALBUMIN 4.8 10/23/2019   CALCIUM 9.3 10/23/2019   ANIONGAP 6 08/27/2016   Lab Results  Component Value Date   CHOL 155 10/23/2019   Lab Results  Component Value Date   HDL 39 (L) 10/23/2019   Lab  Results  Component Value Date   LDLCALC 85 10/23/2019   Lab Results  Component Value Date   TRIG 179 (H) 10/23/2019   Lab Results  Component Value Date   CHOLHDL 4.0 10/23/2019   Lab Results  Component Value Date   HGBA1C 5.8 (A) 09/18/2018   Assessment & Plan:   1. Annual physical exam Physical assessment within normal for age. Basic Neurology assessment normal.  Recommend monthly self testicular exam Recommend daily multivitamin for men Recommend strength training in 150 minutes of cardiovascular exercise per week  2. Hypertension, unspecified type The current medical regimen is effective; blood pressure is stable at 147/87 today; continue present plan and medications as prescribed. He will continue to take medications as prescribed, to decrease high sodium intake, excessive alcohol intake, increase potassium intake, smoking cessation, and increase physical activity of at least 30 minutes of cardio activity daily. He will continue to follow Heart Healthy or DASH diet. - metoprolol tartrate (LOPRESSOR) 50 MG tablet; Take 1 tablet (50 mg total) by mouth 2 (two) times daily.  Dispense: 180 tablet; Refill: 3 - amLODipine (NORVASC) 5 MG tablet; Take 1 tablet (5 mg total) by mouth daily.  Dispense: 90 tablet; Refill: 3  3. Colon cancer screening - Cologuard  4. Hypercholesteremia - aspirin ([facility] ASPIRIN EC) 325 MG EC tablet; Take 1 tablet (325 mg total) by mouth daily.  Dispense: 100 tablet; Refill: 3  5. History of stroke  6. Right sided weakness Stable. Not worsening. Encouraged patient to use cane as needed for fall prevention.   7. Healthcare maintenance - CBC with Differential - Comprehensive metabolic  panel - Lipid Panel - TSH - PSA - Vitamin B12 - Vitamin D, 25-hydroxy  8. Follow up He will follow up in 6 months.    Meds ordered this encounter  Medications  . metoprolol tartrate (LOPRESSOR) 50 MG tablet    Sig: Take 1 tablet (50 mg total) by mouth 2 (two) times daily.    Dispense:  180 tablet    Refill:  3  . atorvastatin (LIPITOR) 80 MG tablet    Sig: Take 1 tablet (80 mg total) by mouth daily.    Dispense:  90 tablet    Refill:  3  . aspirin ([facility] ASPIRIN EC) 325 MG EC tablet    Sig: Take 1 tablet (325 mg total) by mouth daily.    Dispense:  100 tablet    Refill:  3  . amLODipine (NORVASC) 5 MG tablet    Sig: Take 1 tablet (5 mg total) by mouth daily.    Dispense:  90 tablet    Refill:  3   Orders Placed This Encounter  Procedures  . Cologuard  . CBC with Differential  . Comprehensive metabolic panel  . Lipid Panel  . TSH  . PSA  . Vitamin B12  . Vitamin D, 25-hydroxy    Referral Orders  No referral(s) requested today    [name], MSN, ANE, FNP-BC [facility] [address] [place], [place] [address] [phone]   Problem List Items Addressed This Visit      Cardiovascular and Mediastinum   Hypertension   Relevant Medications   metoprolol tartrate (LOPRESSOR) 50 MG tablet   atorvastatin (LIPITOR) 80 MG tablet   aspirin ([facility] ASPIRIN EC) 325 MG EC tablet   amLODipine (NORVASC) 5 MG tablet    Other Visit Diagnoses    Annual physical  exam    -  Primary   Colon cancer screening       Relevant Orders   Cologuard   Hypercholesteremia       Relevant Medications   metoprolol tartrate (LOPRESSOR) 50 MG tablet   atorvastatin (LIPITOR) 80 MG tablet   aspirin ([facility] ASPIRIN EC) 325 MG EC tablet   amLODipine (NORVASC) 5 MG tablet   History of stroke       Right sided weakness       Healthcare maintenance       Relevant Orders   CBC with  Differential   Comprehensive metabolic panel   Lipid Panel   TSH   PSA   Vitamin B12   Vitamin D, 25-hydroxy   Follow up          Meds ordered this encounter  Medications  . metoprolol tartrate (LOPRESSOR) 50 MG tablet    Sig: Take 1 tablet (50 mg total) by mouth 2 (two) times daily.    Dispense:  180 tablet    Refill:  3  . atorvastatin (LIPITOR) 80 MG tablet    Sig: Take 1 tablet (80 mg total) by mouth daily.    Dispense:  90 tablet    Refill:  3  . aspirin ([facility] ASPIRIN EC) 325 MG EC tablet    Sig: Take 1 tablet (325 mg total) by mouth daily.    Dispense:  100 tablet    Refill:  3  . amLODipine (NORVASC) 5 MG tablet    Sig: Take 1 tablet (5 mg total) by mouth daily.    Dispense:  90 tablet    Refill:  3    Follow-up: No follow-ups on file.    Kallie Locks, FNP

## 2020-07-08 ENCOUNTER — Other Ambulatory Visit: Payer: Self-pay | Admitting: Family Medicine

## 2020-07-08 ENCOUNTER — Encounter: Payer: Self-pay | Admitting: Family Medicine

## 2020-07-08 DIAGNOSIS — E559 Vitamin D deficiency, unspecified: Secondary | ICD-10-CM

## 2020-07-08 LAB — COMPREHENSIVE METABOLIC PANEL
ALT: 37 IU/L (ref 0–44)
AST: 27 IU/L (ref 0–40)
Albumin/Globulin Ratio: 2.5 — ABNORMAL HIGH (ref 1.2–2.2)
Albumin: 4.9 g/dL (ref 3.8–4.9)
Alkaline Phosphatase: 75 IU/L (ref 44–121)
BUN/Creatinine Ratio: 16 (ref 9–20)
BUN: 16 mg/dL (ref 6–24)
Bilirubin Total: 0.5 mg/dL (ref 0.0–1.2)
CO2: 21 mmol/L (ref 20–29)
Calcium: 9.5 mg/dL (ref 8.7–10.2)
Chloride: 102 mmol/L (ref 96–106)
Creatinine, Ser: 0.98 mg/dL (ref 0.76–1.27)
Globulin, Total: 2 g/dL (ref 1.5–4.5)
Glucose: 83 mg/dL (ref 65–99)
Potassium: 3.9 mmol/L (ref 3.5–5.2)
Sodium: 139 mmol/L (ref 134–144)
Total Protein: 6.9 g/dL (ref 6.0–8.5)
eGFR: 92 mL/min/{1.73_m2} (ref >59)

## 2020-07-08 LAB — VITAMIN B12: Vitamin B-12: 413 pg/mL (ref 232–1245)

## 2020-07-08 LAB — LIPID PANEL
Chol/HDL Ratio: 3.1 ratio (ref 0.0–5.0)
Cholesterol, Total: 141 mg/dL (ref 100–199)
HDL: 46 mg/dL (ref >39)
LDL Chol Calc (NIH): 78 mg/dL (ref 0–99)
Triglycerides: 92 mg/dL (ref 0–149)
VLDL Cholesterol Cal: 17 mg/dL (ref 5–40)

## 2020-07-08 LAB — CBC WITH DIFFERENTIAL/PLATELET
Basophils Absolute: 0 10*3/uL (ref 0.0–0.2)
Basos: 1 %
EOS (ABSOLUTE): 0.2 10*3/uL (ref 0.0–0.4)
Eos: 5 %
Hematocrit: 44.1 % (ref 37.5–51.0)
Hemoglobin: 14.9 g/dL (ref 13.0–17.7)
Immature Grans (Abs): 0 10*3/uL (ref 0.0–0.1)
Immature Granulocytes: 0 %
Lymphocytes Absolute: 1 10*3/uL (ref 0.7–3.1)
Lymphs: 27 %
MCH: 30.2 pg (ref 26.6–33.0)
MCHC: 33.8 g/dL (ref 31.5–35.7)
MCV: 89 fL (ref 79–97)
Monocytes Absolute: 0.4 10*3/uL (ref 0.1–0.9)
Monocytes: 10 %
Neutrophils Absolute: 2.1 10*3/uL (ref 1.4–7.0)
Neutrophils: 57 %
Platelets: 214 10*3/uL (ref 150–450)
RBC: 4.94 x10E6/uL (ref 4.14–5.80)
RDW: 12.7 % (ref 11.6–15.4)
WBC: 3.7 10*3/uL (ref 3.4–10.8)

## 2020-07-08 LAB — PSA: Prostate Specific Ag, Serum: 0.6 ng/mL (ref 0.0–4.0)

## 2020-07-08 LAB — TSH: TSH: 1.01 u[IU]/mL (ref 0.450–4.500)

## 2020-07-08 LAB — VITAMIN D 25 HYDROXY (VIT D DEFICIENCY, FRACTURES): Vit D, 25-Hydroxy: 15.9 ng/mL — ABNORMAL LOW (ref 30.0–100.0)

## 2020-07-08 MED ORDER — VITAMIN D (ERGOCALCIFEROL) 1.25 MG (50000 UNIT) PO CAPS
50000.0000 [IU] | ORAL_CAPSULE | ORAL | 2 refills | Status: DC
Start: 2020-07-08 — End: 2020-07-08

## 2020-07-09 MED FILL — VIT D2 1.25 MG (50,000 UNIT: 1.25 MG | 35 days supply | Qty: 5 | Fill #0

## 2020-07-13 ENCOUNTER — Telehealth: Payer: Self-pay

## 2020-07-13 NOTE — Telephone Encounter (Signed)
Left message for patient to call back regarding results and recommendations. °

## 2020-07-13 NOTE — Telephone Encounter (Signed)
-----   Message from Brad Locks, FNP sent at 07/08/2020  9:03 PM EDT ----- Vitamin D level is moderately low. Rx for Vitamin D supplement sent to pharmacy today. He will take medication once weekly as prescribed. He should include foods that are high in Vitamin D. These include: Salmon, Cod Liver Oil, Mushrooms, Canned Fish, Milk, and Egg Yolks.   All other labs are stable. Keep follow up appointment.

## 2020-07-14 ENCOUNTER — Telehealth: Payer: Self-pay

## 2020-07-14 NOTE — Telephone Encounter (Signed)
-----   Message from Natalie M Stroud, FNP sent at 07/08/2020  9:03 PM EDT ----- Vitamin D level is moderately low. Rx for Vitamin D supplement sent to pharmacy today. He will take medication once weekly as prescribed. He should include foods that are high in Vitamin D. These include: Salmon, Cod Liver Oil, Mushrooms, Canned Fish, Milk, and Egg Yolks.   All other labs are stable. Keep follow up appointment.  

## 2020-07-14 NOTE — Telephone Encounter (Signed)
Left message informing patient of lab results and recommendations. 

## 2020-07-24 ENCOUNTER — Telehealth: Payer: Self-pay

## 2020-07-24 NOTE — Telephone Encounter (Signed)
Called and lvm for pt regarding his lab results.

## 2020-07-24 NOTE — Telephone Encounter (Signed)
Please call patient with results (Lab)

## 2020-07-27 NOTE — Telephone Encounter (Signed)
Called and spoke w/ patient regarding his lab results,discuss  vit d taking  once a week

## 2020-08-10 ENCOUNTER — Other Ambulatory Visit (HOSPITAL_COMMUNITY): Payer: Self-pay

## 2020-08-10 MED FILL — Amlodipine Besylate Tab 5 MG (Base Equivalent): ORAL | 90 days supply | Qty: 90 | Fill #0 | Status: AC

## 2020-10-08 ENCOUNTER — Other Ambulatory Visit (HOSPITAL_COMMUNITY): Payer: Self-pay

## 2020-10-08 MED FILL — Atorvastatin Calcium Tab 80 MG (Base Equivalent): ORAL | 90 days supply | Qty: 90 | Fill #0 | Status: AC

## 2020-10-08 MED FILL — Metoprolol Tartrate Tab 50 MG: ORAL | 90 days supply | Qty: 180 | Fill #0 | Status: AC

## 2020-10-08 MED FILL — Ergocalciferol Cap 1.25 MG (50000 Unit): ORAL | 35 days supply | Qty: 5 | Fill #0 | Status: AC

## 2020-11-02 ENCOUNTER — Other Ambulatory Visit (HOSPITAL_COMMUNITY): Payer: Self-pay

## 2020-11-02 MED FILL — Amlodipine Besylate Tab 5 MG (Base Equivalent): ORAL | 90 days supply | Qty: 90 | Fill #1 | Status: AC

## 2020-12-07 ENCOUNTER — Ambulatory Visit (INDEPENDENT_AMBULATORY_CARE_PROVIDER_SITE_OTHER): Payer: 59 | Admitting: Nurse Practitioner

## 2020-12-07 ENCOUNTER — Encounter: Payer: Self-pay | Admitting: Nurse Practitioner

## 2020-12-07 ENCOUNTER — Other Ambulatory Visit (HOSPITAL_COMMUNITY): Payer: Self-pay

## 2020-12-07 ENCOUNTER — Other Ambulatory Visit: Payer: Self-pay

## 2020-12-07 VITALS — BP 129/78 | HR 57 | Temp 97.5°F | Ht 62.0 in | Wt 167.1 lb

## 2020-12-07 DIAGNOSIS — R7303 Prediabetes: Secondary | ICD-10-CM

## 2020-12-07 DIAGNOSIS — E7800 Pure hypercholesterolemia, unspecified: Secondary | ICD-10-CM | POA: Diagnosis not present

## 2020-12-07 DIAGNOSIS — Z1211 Encounter for screening for malignant neoplasm of colon: Secondary | ICD-10-CM | POA: Diagnosis not present

## 2020-12-07 DIAGNOSIS — I10 Essential (primary) hypertension: Secondary | ICD-10-CM

## 2020-12-07 LAB — POCT GLYCOSYLATED HEMOGLOBIN (HGB A1C): Hemoglobin A1C: 5.8 % — AB (ref 4.0–5.6)

## 2020-12-07 MED ORDER — METOPROLOL TARTRATE 50 MG PO TABS
50.0000 mg | ORAL_TABLET | Freq: Two times a day (BID) | ORAL | 3 refills | Status: DC
  Filled 2020-12-07: qty 180, fill #0
  Filled 2021-01-13: qty 180, 90d supply, fill #0
  Filled 2021-04-07: qty 180, 90d supply, fill #1
  Filled 2021-08-09: qty 60, 30d supply, fill #2

## 2020-12-07 MED ORDER — AMLODIPINE BESYLATE 5 MG PO TABS
5.0000 mg | ORAL_TABLET | Freq: Every day | ORAL | 3 refills | Status: DC
  Filled 2020-12-07 – 2021-01-13 (×2): qty 90, 90d supply, fill #0
  Filled 2021-04-07: qty 90, 90d supply, fill #1
  Filled 2021-08-09: qty 90, 90d supply, fill #2

## 2020-12-07 MED ORDER — ATORVASTATIN CALCIUM 80 MG PO TABS
80.0000 mg | ORAL_TABLET | Freq: Every day | ORAL | 3 refills | Status: DC
  Filled 2020-12-07: qty 90, fill #0
  Filled 2021-01-13: qty 90, 90d supply, fill #0
  Filled 2021-04-07: qty 90, 90d supply, fill #1
  Filled 2021-08-09: qty 30, 30d supply, fill #2

## 2020-12-07 NOTE — Patient Instructions (Signed)
Prediabetes Eating Plan Prediabetes is a condition that causes blood sugar (glucose) levels to be higher than normal. This increases the risk for developing type 2 diabetes (type 2 diabetes mellitus). Working with a health care provider or nutrition specialist (dietitian) to make diet and lifestyle changes can help prevent the onset of diabetes. These changes may help you: Control your blood glucose levels. Improve your cholesterol levels. Manage your blood pressure. What are tips for following this plan? Reading food labels Read food labels to check the amount of fat, salt (sodium), and sugar in prepackaged foods. Avoid foods that have: Saturated fats. Trans fats. Added sugars. Avoid foods that have more than 300 milligrams (mg) of sodium per serving. Limit your sodium intake to less than 2,300 mg each day. Shopping Avoid buying pre-made and processed foods. Avoid buying drinks with added sugar. Cooking Cook with olive oil. Do not use butter, lard, or ghee. Bake, broil, grill, steam, or boil foods. Avoid frying. Meal planning  Work with your dietitian to create an eating plan that is right for you. This may include tracking how many calories you take in each day. Use a food diary, notebook, or mobile application to track what you eat at each meal. Consider following a Mediterranean diet. This includes: Eating several servings of fresh fruits and vegetables each day. Eating fish at least twice a week. Eating one serving each day of whole grains, beans, nuts, and seeds. Using olive oil instead of other fats. Limiting alcohol. Limiting red meat. Using nonfat or low-fat dairy products. Consider following a plant-based diet. This includes dietary choices that focus on eating mostly vegetables and fruit, grains, beans, nuts, and seeds. If you have high blood pressure, you may need to limit your sodium intake or follow a diet such as the DASH (Dietary Approaches to Stop Hypertension) eating  plan. The DASH diet aims to lower high blood pressure.  Lifestyle Set weight loss goals with help from your health care team. It is recommended that most people with prediabetes lose 7% of their body weight. Exercise for at least 30 minutes 5 or more days a week. Attend a support group or seek support from a mental health counselor. Take over-the-counter and prescription medicines only as told by your health care provider. What foods are recommended? Fruits Berries. Bananas. Apples. Oranges. Grapes. Papaya. Mango. Pomegranate. Kiwi.Grapefruit. Cherries. Vegetables Lettuce. Spinach. Peas. Beets. Cauliflower. Cabbage. Broccoli. Carrots.Tomatoes. Squash. Eggplant. Herbs. Peppers. Onions. Cucumbers. Brussels sprouts. Grains Whole grains, such as whole-wheat or whole-grain breads, crackers, cereals, and pasta. Unsweetened oatmeal. Bulgur. Barley. Quinoa. Brown rice. Corn orwhole-wheat flour tortillas or taco shells. Meats and other proteins Seafood. Poultry without skin. Lean cuts of pork and beef. Tofu. Eggs. Nuts.Beans. Dairy Low-fat or fat-free dairy products, such as yogurt, cottage cheese, and cheese. Beverages Water. Tea. Coffee. Sugar-free or diet soda. Seltzer water. Low-fat or nonfatmilk. Milk alternatives, such as soy or almond milk. Fats and oils Olive oil. Canola oil. Sunflower oil. Grapeseed oil. Avocado. Walnuts. Sweets and desserts Sugar-free or low-fat pudding. Sugar-free or low-fat ice cream and other frozentreats. Seasonings and condiments Herbs. Sodium-free spices. Mustard. Relish. Low-salt, low-sugar ketchup.Low-salt, low-sugar barbecue sauce. Low-fat or fat-free mayonnaise. The items listed above may not be a complete list of recommended foods and beverages. Contact a dietitian for more information. What foods are not recommended? Fruits Fruits canned with syrup. Vegetables Canned vegetables. Frozen vegetables with butter or cream sauce. Grains Refined white flour and  flour products, such as bread, pasta, snack  foods, andcereals. Meats and other proteins Fatty cuts of meat. Poultry with skin. Breaded or fried meat. Processed meats. Dairy Full-fat yogurt, cheese, or milk. Beverages Sweetened drinks, such as iced tea and soda. Fats and oils Butter. Lard. Ghee. Sweets and desserts Baked goods, such as cake, cupcakes, pastries, cookies, and cheesecake. Seasonings and condiments Spice mixes with added salt. Ketchup. Barbecue sauce. Mayonnaise. The items listed above may not be a complete list of foods and beverages that are not recommended. Contact a dietitian for more information. Where to find more information American Diabetes Association: www.diabetes.org Summary You may need to make diet and lifestyle changes to help prevent the onset of diabetes. These changes can help you control blood sugar, improve cholesterol levels, and manage blood pressure. Set weight loss goals with help from your health care team. It is recommended that most people with prediabetes lose 7% of their body weight. Consider following a Mediterranean diet. This includes eating plenty of fresh fruits and vegetables, whole grains, beans, nuts, seeds, fish, and low-fat dairy, and using olive oil instead of other fats. This information is not intended to replace advice given to you by your health care provider. Make sure you discuss any questions you have with your healthcare provider. Document Revised: 07/04/2019 Document Reviewed: 07/04/2019 Elsevier Patient Education  2022 Else[employer] Inc. Managing Your Hypertension Hypertension, also called high blood pressure, is when the force of the blood pressing against the walls of the arteries is too strong. Arteries are blood vessels that carry blood from your heart throughout your body. Hypertension forces the heart to work harder to pump blood and may cause the arteries tobecome narrow or stiff. Understanding blood pressure readings Your  personal target blood pressure may vary depending on your medical conditions, your age, and other factors. A blood pressure reading includes a higher number over a lower number. Ideally, your blood pressure should be below 120/80. You should know that: The first, or top, number is called the systolic pressure. It is a measure of the pressure in your arteries as your heart beats. The second, or bottom number, is called the diastolic pressure. It is a measure of the pressure in your arteries as the heart relaxes. Blood pressure is classified into four stages. Based on your blood pressure reading, your health care provider may use the following stages to determine what type of treatment you need, if any. Systolic pressure and diastolicpressure are measured in a unit called mmHg. Normal Systolic pressure: below 120. Diastolic pressure: below 80. Elevated Systolic pressure: 120-129. Diastolic pressure: below 80. Hypertension stage 1 Systolic pressure: 130-139. Diastolic pressure: 80-89. Hypertension stage 2 Systolic pressure: 140 or above. Diastolic pressure: 90 or above. How can this condition affect me? Managing your hypertension is an important responsibility. Over time, hypertension can damage the arteries and decrease blood flow to important parts of the body, including the brain, heart, and kidneys. Having untreated or uncontrolled hypertension can lead to: A heart attack. A stroke. A weakened blood vessel (aneurysm). Heart failure. Kidney damage. Eye damage. Metabolic syndrome. Memory and concentration problems. Vascular dementia. What actions can I take to manage this condition? Hypertension can be managed by making lifestyle changes and possibly by taking medicines. Your health care provider will help you make a plan to bring yourblood pressure within a normal range. Nutrition  Eat a diet that is high in fiber and potassium, and low in salt (sodium), added sugar, and fat. An  example eating plan is called the Dietary Approaches  to Stop Hypertension (DASH) diet. To eat this way: Eat plenty of fresh fruits and vegetables. Try to fill one-half of your plate at each meal with fruits and vegetables. Eat whole grains, such as whole-wheat pasta, brown rice, or whole-grain bread. Fill about one-fourth of your plate with whole grains. Eat low-fat dairy products. Avoid fatty cuts of meat, processed or cured meats, and poultry with skin. Fill about one-fourth of your plate with lean proteins such as fish, chicken without skin, beans, eggs, and tofu. Avoid pre-made and processed foods. These tend to be higher in sodium, added sugar, and fat. Reduce your daily sodium intake. Most people with hypertension should eat less than 1,500 mg of sodium a day.  Lifestyle  Work with your health care provider to maintain a healthy body weight or to lose weight. Ask what an ideal weight is for you. Get at least 30 minutes of exercise that causes your heart to beat faster (aerobic exercise) most days of the week. Activities may include walking, swimming, or biking. Include exercise to strengthen your muscles (resistance exercise), such as weight lifting, as part of your weekly exercise routine. Try to do these types of exercises for 30 minutes at least 3 days a week. Do not use any products that contain nicotine or tobacco, such as cigarettes, e-cigarettes, and chewing tobacco. If you need help quitting, ask your health care provider. Control any long-term (chronic) conditions you have, such as high cholesterol or diabetes. Identify your sources of stress and find ways to manage stress. This may include meditation, deep breathing, or making time for fun activities.  Alcohol use Do not drink alcohol if: Your health care provider tells you not to drink. You are pregnant, may be pregnant, or are planning to become pregnant. If you drink alcohol: Limit how much you use to: 0-1 drink a day for  women. 0-2 drinks a day for men. Be aware of how much alcohol is in your drink. In the [place] one drink equals one 12 oz bottle of beer (355 mL), one 5 oz glass of wine (148 mL), or one 1 oz glass of hard liquor (44 mL). Medicines Your health care provider may prescribe medicine if lifestyle changes are not enough to get your blood pressure under control and if: Your systolic blood pressure is 130 or higher. Your diastolic blood pressure is 80 or higher. Take medicines only as told by your health care provider. Follow the directions carefully. Blood pressure medicines must be taken as told by your health care provider. The medicine does not work as well when you skip doses. Skippingdoses also puts you at risk for problems. Monitoring Before you monitor your blood pressure: Do not smoke, drink caffeinated beverages, or exercise within 30 minutes before taking a measurement. Use the bathroom and empty your bladder (urinate). Sit quietly for at least 5 minutes before taking measurements. Monitor your blood pressure at home as told by your health care provider. To do this: Sit with your back straight and supported. Place your feet flat on the floor. Do not cross your legs. Support your arm on a flat surface, such as a table. Make sure your upper arm is at heart level. Each time you measure, take two or three readings one minute apart and record the results. You may also need to have your blood pressure checked regularly by your healthcare provider. General information Talk with your health care provider about your diet, exercise habits, and other lifestyle factors that may be  contributing to hypertension. Review all the medicines you take with your health care provider because there may be side effects or interactions. Keep all visits as told by your health care provider. Your health care provider can help you create and adjust your plan for managing your high blood pressure. Where to find more  information National Heart, Lung, and Blood Institute: PopSteam.is American Heart Association: www.heart.org Contact a health care provider if: You think you are having a reaction to medicines you have taken. You have repeated (recurrent) headaches. You feel dizzy. You have swelling in your ankles. You have trouble with your vision. Get help right away if: You develop a severe headache or confusion. You have unusual weakness or numbness, or you feel faint. You have severe pain in your chest or abdomen. You vomit repeatedly. You have trouble breathing. These symptoms may represent a serious problem that is an emergency. Do not wait to see if the symptoms will go away. Get medical help right away. Call your local emergency services (911 in the [place] Do not drive yourself to the hospital. Summary Hypertension is when the force of blood pumping through your arteries is too strong. If this condition is not controlled, it may put you at risk for serious complications. Your personal target blood pressure may vary depending on your medical conditions, your age, and other factors. For most people, a normal blood pressure is less than 120/80. Hypertension is managed by lifestyle changes, medicines, or both. Lifestyle changes to help manage hypertension include losing weight, eating a healthy, low-sodium diet, exercising more, stopping smoking, and limiting alcohol. This information is not intended to replace advice given to you by your health care provider. Make sure you discuss any questions you have with your healthcare provider. Document Revised: 05/10/2019 Document Reviewed: 03/05/2019 Elsevier Patient Education  2022 [employer].

## 2020-12-07 NOTE — Progress Notes (Signed)
[facility] Patient [facility] [address] [place], [place]  [address] Phone:  [phone]   Fax:  [phone]   Established Patient Office Visit  Subjective:  Patient ID: Brad Tran, male    DOB: 1966/01/06  Age: 55 y.o. MRN: [id number]  CC:  Chief Complaint  Patient presents with   Follow-up    6 month follow up    HPI Mariel Widger presents for follow up. A former patient of NP Stroud. He  has a past medical history of Hyperlipidemia, Hypertension, Impaired ambulation, Right sided weakness, Stroke (HCC), and Vitamin D deficiency (06/2020).   Hypertension Patient is here for follow-up of elevated blood pressure. He is exercising and is adherent to a low-salt diet. Blood pressure is well controlled at home. Cardiac symptoms: none. Patient denies chest pain, chest pressure/discomfort, claudication, dyspnea, exertional chest pressure/discomfort, fatigue, irregular heart beat, lower extremity edema, near-syncope, orthopnea, palpitations, paroxysmal nocturnal dyspnea, syncope, and tachypnea. Cardiovascular risk factors: advanced age (older than 87 for men, 13 for women), family history of premature cardiovascular disease, hypertension, male gender, and obesity (BMI >= 30 kg/m2). Use of agents associated with hypertension: none. History of target organ damage: stroke.  He reports that he was born [birth year]. His actual age is 39.    Past Medical History:  Diagnosis Date   Hyperlipidemia    Hypertension    Impaired ambulation    Right sided weakness    Stroke [facility])    Vitamin D deficiency 06/2020    History reviewed. No pertinent surgical history.  Family History  Problem Relation Age of Onset   Heart attack Father    Stroke Father     Social History   Socioeconomic History   Marital status: Divorced    Spouse name: Not on file   Number of children: Not on file   Years of education: Not on file   Highest education level: Not on file  Occupational History    Not on file  Tobacco Use   Smoking status: Never   Smokeless tobacco: Never  Vaping Use   Vaping Use: Never used  Substance and Sexual Activity   Alcohol use: No   Drug use: No   Sexual activity: Yes  Other Topics Concern   Not on file  Social History Narrative   Not on file   Social Determinants of Health   Financial Resource Strain: Not on file  Food Insecurity: Not on file  Transportation Needs: Not on file  Physical Activity: Not on file  Stress: Not on file  Social Connections: Not on file  Intimate Partner Violence: Not on file    Outpatient Medications Prior to Visit  Medication Sig Dispense Refill   aspirin EC 325 MG tablet TAKE 1 TABLET (325 MG TOTAL) BY MOUTH DAILY. 100 tablet 3   amLODipine (NORVASC) 5 MG tablet Take 1 tablet (5 mg total) by mouth daily. 90 tablet 3   aspirin ([facility] ASPIRIN EC) 325 MG EC tablet Take 1 tablet (325 mg total) by mouth daily. 100 tablet 3   atorvastatin (LIPITOR) 80 MG tablet TAKE 1 TABLET (80 MG TOTAL) BY MOUTH DAILY. 90 tablet 3   metoprolol tartrate (LOPRESSOR) 50 MG tablet TAKE 1 TABLET (50 MG TOTAL) BY MOUTH 2 (TWO) TIMES DAILY. 180 tablet 3   Vitamin D, Ergocalciferol, (DRISDOL) 1.25 MG (50000 UNIT) CAPS capsule TAKE 1 CAPSULE (50,000 UNITS TOTAL) BY MOUTH EVERY 7 (SEVEN) DAYS. 5 capsule 2   No facility-administered medications  prior to visit.    No Known Allergies  ROS Review of Systems    Objective:    Physical Exam HENT:     Head: Normocephalic and atraumatic.     Nose: Nose normal.  Cardiovascular:     Rate and Rhythm: Normal rate and regular rhythm.     Pulses: Normal pulses.     Heart sounds: Normal heart sounds.  Pulmonary:     Effort: Pulmonary effort is normal.     Breath sounds: Normal breath sounds.  Abdominal:     General: Bowel sounds are normal.     Palpations: Abdomen is soft.  Musculoskeletal:     Cervical back: Normal range of motion.  Skin:    General: Skin is warm.     Capillary Refill:  Capillary refill takes less than 2 seconds.  Neurological:     General: No focal deficit present.     Mental Status: He is alert and oriented to person, place, and time.  Psychiatric:        Mood and Affect: Mood normal.        Behavior: Behavior normal.        Thought Content: Thought content normal.        Judgment: Judgment normal.    BP 129/78 (BP Location: Left Arm, Patient Position: Sitting)   Pulse (!) 57   Temp (!) 97.5 F (36.4 C)   Ht 5\' 2"  (1.575 m)   Wt 167 lb 0.8 oz (75.8 kg)   SpO2 99%   BMI 30.55 kg/m  Wt Readings from Last 3 Encounters:  12/07/20 167 lb 0.8 oz (75.8 kg)  07/07/20 182 lb (82.6 kg)  03/20/20 180 lb (81.6 kg)     Health Maintenance Due  Topic Date Due   COVID-19 Vaccine (1) Never done   INFLUENZA VACCINE  11/16/2020    There are no preventive care reminders to display for this patient.  Lab Results  Component Value Date   TSH 1.010 07/07/2020   Lab Results  Component Value Date   WBC 3.7 07/07/2020   HGB 14.9 07/07/2020   HCT 44.1 07/07/2020   MCV 89 07/07/2020   PLT 214 07/07/2020   Lab Results  Component Value Date   NA 139 07/07/2020   K 3.9 07/07/2020   CO2 21 07/07/2020   GLUCOSE 83 07/07/2020   BUN 16 07/07/2020   CREATININE 0.98 07/07/2020   BILITOT 0.5 07/07/2020   ALKPHOS 75 07/07/2020   AST 27 07/07/2020   ALT 37 07/07/2020   PROT 6.9 07/07/2020   ALBUMIN 4.9 07/07/2020   CALCIUM 9.5 07/07/2020   ANIONGAP 6 08/27/2016   EGFR 92 07/07/2020   Lab Results  Component Value Date   CHOL 141 07/07/2020   Lab Results  Component Value Date   HDL 46 07/07/2020   Lab Results  Component Value Date   LDLCALC 78 07/07/2020   Lab Results  Component Value Date   TRIG 92 07/07/2020   Lab Results  Component Value Date   CHOLHDL 3.1 07/07/2020   Lab Results  Component Value Date   HGBA1C 5.8 (A) 12/07/2020      Assessment & Plan:   Problem List Items Addressed This Visit       Cardiovascular and  Mediastinum   Hypertension - Primary Encouraged on going compliance with current medication regimen Encouraged home monitoring and recording BP <130/80 Eating a heart-healthy diet with less salt Encouraged regular physical activity  Recommend Weight loss  Relevant Medications   amLODipine (NORVASC) 5 MG tablet   atorvastatin (LIPITOR) 80 MG tablet   metoprolol tartrate (LOPRESSOR) 50 MG tablet     Other   Prediabetes Consider home glucose monitoring Weight loss at least 5% of current body weight is can be achieved with lifestyle modification dietary changes and regular daily exercise Encourage blood pressure control goal <120/80 and maintaining total cholesterol <200 Follow-up every 3 to 6 months for reevaluation Education material provided    Relevant Orders   HgB A1c (Completed)   Other Visit Diagnoses     Hypercholesteremia     Heart healthy diet   Relevant Medications   amLODipine (NORVASC) 5 MG tablet   atorvastatin (LIPITOR) 80 MG tablet   metoprolol tartrate (LOPRESSOR) 50 MG tablet   Screening for colon cancer       Relevant Orders   Ambulatory referral to Gastroenterology       Meds ordered this encounter  Medications   amLODipine (NORVASC) 5 MG tablet    Sig: Take 1 tablet (5 mg total) by mouth daily.    Dispense:  90 tablet    Refill:  3    Order Specific Question:   Supervising Provider    Answer:   Tresa Garter [[id number]]   atorvastatin (LIPITOR) 80 MG tablet    Sig: TAKE 1 TABLET (80 MG TOTAL) BY MOUTH DAILY.    Dispense:  90 tablet    Refill:  3    Order Specific Question:   Supervising Provider    Answer:   Tresa Garter [[id number]]   metoprolol tartrate (LOPRESSOR) 50 MG tablet    Sig: TAKE 1 TABLET (50 MG TOTAL) BY MOUTH 2 (TWO) TIMES DAILY.    Dispense:  180 tablet    Refill:  3    Order Specific Question:   Supervising Provider    Answer:   Tresa Garter [id number]    Follow-up: No follow-ups on file.    Vevelyn Francois, NP

## 2020-12-08 LAB — LIPID PANEL
Chol/HDL Ratio: 3.2 ratio (ref 0.0–5.0)
Cholesterol, Total: 167 mg/dL (ref 100–199)
HDL: 52 mg/dL (ref >39)
LDL Chol Calc (NIH): 94 mg/dL (ref 0–99)
Triglycerides: 118 mg/dL (ref 0–149)
VLDL Cholesterol Cal: 21 mg/dL (ref 5–40)

## 2020-12-08 LAB — COMP. METABOLIC PANEL (12)
AST: 23 IU/L (ref 0–40)
Albumin/Globulin Ratio: 2.6 — ABNORMAL HIGH (ref 1.2–2.2)
Albumin: 4.9 g/dL (ref 3.8–4.9)
Alkaline Phosphatase: 60 IU/L (ref 44–121)
BUN/Creatinine Ratio: 17 (ref 9–20)
BUN: 18 mg/dL (ref 6–24)
Bilirubin Total: 0.4 mg/dL (ref 0.0–1.2)
Calcium: 9.7 mg/dL (ref 8.7–10.2)
Chloride: 104 mmol/L (ref 96–106)
Creatinine, Ser: 1.08 mg/dL (ref 0.76–1.27)
Globulin, Total: 1.9 g/dL (ref 1.5–4.5)
Glucose: 81 mg/dL (ref 65–99)
Potassium: 4.4 mmol/L (ref 3.5–5.2)
Sodium: 142 mmol/L (ref 134–144)
Total Protein: 6.8 g/dL (ref 6.0–8.5)
eGFR: 81 mL/min/{1.73_m2} (ref >59)

## 2021-01-13 ENCOUNTER — Other Ambulatory Visit (HOSPITAL_COMMUNITY): Payer: Self-pay

## 2021-04-07 ENCOUNTER — Other Ambulatory Visit (HOSPITAL_COMMUNITY): Payer: Self-pay

## 2021-08-03 ENCOUNTER — Encounter: Payer: Self-pay | Admitting: Gastroenterology

## 2021-08-09 ENCOUNTER — Other Ambulatory Visit (HOSPITAL_COMMUNITY): Payer: Self-pay

## 2021-08-11 ENCOUNTER — Other Ambulatory Visit (HOSPITAL_COMMUNITY): Payer: Self-pay

## 2021-08-11 ENCOUNTER — Ambulatory Visit (AMBULATORY_SURGERY_CENTER): Payer: 59 | Admitting: *Deleted

## 2021-08-11 VITALS — Ht 62.0 in | Wt 166.0 lb

## 2021-08-11 DIAGNOSIS — Z1211 Encounter for screening for malignant neoplasm of colon: Secondary | ICD-10-CM

## 2021-08-11 MED ORDER — PEG 3350-KCL-NA BICARB-NACL 420 G PO SOLR
4000.0000 mL | Freq: Once | ORAL | 0 refills | Status: AC
  Filled 2021-08-11: qty 4000, 1d supply, fill #0

## 2021-08-11 NOTE — Progress Notes (Signed)
Patient's pre-visit was done today over the phone with the patient. Name,DOB and address verified. Patient denies any allergies to Eggs and Soy. Patient denies any past surgeries. Patient is not taking any diet pills or blood thinners. No home Oxygen. Insurance confirmed with patient. ? ?Prep instructions sent to pt's MyChart (if available) & mailed to pt-pt is aware. Patient understands to call us back with any questions or concerns. Patient is aware of our care-partner policy.  ? ?EMMI education assigned to the patient for the procedure, sent to [place].  ? ?The patient is COVID-19 vaccinated.   ?

## 2021-08-19 ENCOUNTER — Other Ambulatory Visit (HOSPITAL_COMMUNITY): Payer: Self-pay

## 2021-09-01 ENCOUNTER — Encounter: Payer: Self-pay | Admitting: Gastroenterology

## 2021-09-03 ENCOUNTER — Other Ambulatory Visit (HOSPITAL_COMMUNITY): Payer: Self-pay

## 2021-09-03 ENCOUNTER — Ambulatory Visit (INDEPENDENT_AMBULATORY_CARE_PROVIDER_SITE_OTHER): Payer: 59 | Admitting: Nurse Practitioner

## 2021-09-03 ENCOUNTER — Encounter: Payer: Self-pay | Admitting: Nurse Practitioner

## 2021-09-03 DIAGNOSIS — I10 Essential (primary) hypertension: Secondary | ICD-10-CM

## 2021-09-03 MED ORDER — ASPIRIN 325 MG PO TABS
325.0000 mg | ORAL_TABLET | Freq: Every day | ORAL | 2 refills | Status: DC
  Filled 2021-09-03: qty 90, 90d supply, fill #0
  Filled 2022-03-02: qty 100, 100d supply, fill #0
  Filled 2022-06-27: qty 30, 30d supply, fill #0

## 2021-09-03 MED ORDER — METOPROLOL TARTRATE 50 MG PO TABS
50.0000 mg | ORAL_TABLET | Freq: Two times a day (BID) | ORAL | 3 refills | Status: DC
  Filled 2021-09-03: qty 180, 90d supply, fill #0
  Filled 2021-12-07: qty 180, 90d supply, fill #1
  Filled 2022-03-02 – 2022-03-14 (×2): qty 180, 90d supply, fill #2
  Filled 2022-06-27: qty 60, 30d supply, fill #3

## 2021-09-03 MED ORDER — ATORVASTATIN CALCIUM 80 MG PO TABS
80.0000 mg | ORAL_TABLET | Freq: Every day | ORAL | 3 refills | Status: DC
  Filled 2021-09-03: qty 90, 90d supply, fill #0
  Filled 2021-12-07: qty 90, 90d supply, fill #1
  Filled 2022-03-02 – 2022-03-14 (×2): qty 90, 90d supply, fill #2
  Filled 2022-06-27: qty 30, 30d supply, fill #3

## 2021-09-03 MED ORDER — AMLODIPINE BESYLATE 5 MG PO TABS
5.0000 mg | ORAL_TABLET | Freq: Every day | ORAL | 3 refills | Status: DC
  Filled 2021-09-03 – 2021-12-07 (×2): qty 90, 90d supply, fill #0
  Filled 2022-03-02 – 2022-03-14 (×2): qty 90, 90d supply, fill #1
  Filled 2022-06-27: qty 30, 30d supply, fill #2

## 2021-09-03 NOTE — Patient Instructions (Signed)
1. Hypertension, unspecified type  - amLODipine (NORVASC) 5 MG tablet; Take 1 tablet (5 mg total) by mouth daily.  Dispense: 90 tablet; Refill: 3 - aspirin 325 MG tablet; Take 1 tablet (325 mg total) by mouth daily.  Dispense: 90 tablet; Refill: 2 - atorvastatin (LIPITOR) 80 MG tablet; Take 1 tablet (80 mg total) by mouth daily.  Dispense: 90 tablet; Refill: 3 - metoprolol tartrate (LOPRESSOR) 50 MG tablet; Take 1 tablet (50 mg total) by mouth 2 (two) times daily.  Dispense: 180 tablet; Refill: 3 - Lipid Panel - CBC - Comprehensive metabolic panel  -low salt diet  -stay active  Follow up:  Follow up in 6 months

## 2021-09-03 NOTE — Progress Notes (Signed)
@Patient  ID: , male    DOB: Apr 30, 1965, 56 y.o.   MRN: [id number]  Chief Complaint  Patient presents with   Hypertension    Patient is here today for her follow up visit with no concerns to address.    Referring provider: [id number], NP   HPI  Brad Tran presents for follow up. A former patient of NP Stroud. He  has a past medical history of Hyperlipidemia, Hypertension, Impaired ambulation, Right sided weakness, Stroke (HCC), and Vitamin D deficiency (06/2020).   Patient is here for follow-up of elevated blood pressure. He states that overall he is doing well. No new issues or concerns today. He is exercising and is adherent to a low-salt diet. Blood pressure is well controlled at home. Cardiac symptoms: none. Patient denies chest pain, chest pressure/discomfort, claudication, dyspnea, exertional chest pressure/discomfort, fatigue, irregular heart beat, lower extremity edema, near-syncope, orthopnea, palpitations, paroxysmal nocturnal dyspnea, syncope, and tachypnea.   Cardiovascular risk factors: advanced age (older than 100 for men, 40 for women), family history of premature cardiovascular disease, hypertension, male gender, and obesity (BMI >= 30 kg/m2). Use of agents associated with hypertension: none. History of target organ damage: stroke.  No Known Allergies  Immunization History  Administered Date(s) Administered   Influenza,inj,Quad PF,6+ Mos 03/17/2016, 03/05/2018   Tdap 03/17/2016    Past Medical History:  Diagnosis Date   Hyperlipidemia    Hypertension    Impaired ambulation    Right sided weakness    Stroke (HCC) 2016   Vitamin D deficiency 06/2020    Tobacco History: Social History   Tobacco Use  Smoking Status Never  Smokeless Tobacco Never   Counseling given: Not Answered   Outpatient Encounter Medications as of 09/03/2021  Medication Sig   [DISCONTINUED] amLODipine (NORVASC) 5 MG tablet Take 1 tablet (5 mg total) by mouth daily.    [DISCONTINUED] aspirin 325 MG tablet Take 325 mg by mouth daily.   [DISCONTINUED] atorvastatin (LIPITOR) 80 MG tablet Take 1 tablet (80 mg total) by mouth daily.   [DISCONTINUED] metoprolol tartrate (LOPRESSOR) 50 MG tablet Take 1 tablet (50 mg total) by mouth 2 (two) times daily.   amLODipine (NORVASC) 5 MG tablet Take 1 tablet (5 mg total) by mouth daily.   aspirin 325 MG tablet Take 1 tablet (325 mg total) by mouth daily.   atorvastatin (LIPITOR) 80 MG tablet Take 1 tablet (80 mg total) by mouth daily.   metoprolol tartrate (LOPRESSOR) 50 MG tablet Take 1 tablet (50 mg total) by mouth 2 (two) times daily.   No facility-administered encounter medications on file as of 09/03/2021.     Review of Systems  Review of Systems  Constitutional: Negative.   HENT: Negative.    Cardiovascular: Negative.   Gastrointestinal: Negative.   Allergic/Immunologic: Negative.   Neurological: Negative.   Psychiatric/Behavioral: Negative.        Physical Exam  BP 135/90   Pulse 64   Temp 98.6 F (37 C)   Ht 5\' 2"  (1.575 m)   Wt 168 lb 12.8 oz (76.6 kg)   SpO2 100%   BMI 30.87 kg/m   Wt Readings from Last 5 Encounters:  09/03/21 168 lb 12.8 oz (76.6 kg)  08/11/21 166 lb (75.3 kg)  12/07/20 167 lb 0.8 oz (75.8 kg)  07/07/20 182 lb (82.6 kg)  03/20/20 180 lb (81.6 kg)     Physical Exam Vitals and nursing note reviewed.  Constitutional:      General: He is not  in acute distress.    Appearance: He is well-developed.  Cardiovascular:     Rate and Rhythm: Normal rate and regular rhythm.  Pulmonary:     Effort: Pulmonary effort is normal.     Breath sounds: Normal breath sounds.  Skin:    General: Skin is warm and dry.  Neurological:     Mental Status: He is alert and oriented to person, place, and time.      Assessment & Plan:   Essential hypertension - amLODipine (NORVASC) 5 MG tablet; Take 1 tablet (5 mg total) by mouth daily.  Dispense: 90 tablet; Refill: 3 - aspirin 325 MG  tablet; Take 1 tablet (325 mg total) by mouth daily.  Dispense: 90 tablet; Refill: 2 - atorvastatin (LIPITOR) 80 MG tablet; Take 1 tablet (80 mg total) by mouth daily.  Dispense: 90 tablet; Refill: 3 - metoprolol tartrate (LOPRESSOR) 50 MG tablet; Take 1 tablet (50 mg total) by mouth 2 (two) times daily.  Dispense: 180 tablet; Refill: 3 - Lipid Panel - CBC - Comprehensive metabolic panel  -low salt diet  -stay active  Follow up:  Follow up in 6 months     Ivonne Andrew, NP 09/06/2021

## 2021-09-04 LAB — LIPID PANEL
Chol/HDL Ratio: 3.4 ratio (ref 0.0–5.0)
Cholesterol, Total: 157 mg/dL (ref 100–199)
HDL: 46 mg/dL (ref >39)
LDL Chol Calc (NIH): 99 mg/dL (ref 0–99)
Triglycerides: 62 mg/dL (ref 0–149)
VLDL Cholesterol Cal: 12 mg/dL (ref 5–40)

## 2021-09-04 LAB — CBC
Hematocrit: 42.4 % (ref 37.5–51.0)
Hemoglobin: 14.3 g/dL (ref 13.0–17.7)
MCH: 29.6 pg (ref 26.6–33.0)
MCHC: 33.7 g/dL (ref 31.5–35.7)
MCV: 88 fL (ref 79–97)
Platelets: 222 10*3/uL (ref 150–450)
RBC: 4.83 x10E6/uL (ref 4.14–5.80)
RDW: 13 % (ref 11.6–15.4)
WBC: 3.8 10*3/uL (ref 3.4–10.8)

## 2021-09-04 LAB — COMPREHENSIVE METABOLIC PANEL
ALT: 26 IU/L (ref 0–44)
AST: 17 IU/L (ref 0–40)
Albumin/Globulin Ratio: 2.4 — ABNORMAL HIGH (ref 1.2–2.2)
Albumin: 4.8 g/dL (ref 3.8–4.9)
Alkaline Phosphatase: 57 IU/L (ref 44–121)
BUN/Creatinine Ratio: 20 (ref 9–20)
BUN: 19 mg/dL (ref 6–24)
Bilirubin Total: 0.6 mg/dL (ref 0.0–1.2)
CO2: 24 mmol/L (ref 20–29)
Calcium: 9.3 mg/dL (ref 8.7–10.2)
Chloride: 104 mmol/L (ref 96–106)
Creatinine, Ser: 0.93 mg/dL (ref 0.76–1.27)
Globulin, Total: 2 g/dL (ref 1.5–4.5)
Glucose: 95 mg/dL (ref 70–99)
Potassium: 4.2 mmol/L (ref 3.5–5.2)
Sodium: 140 mmol/L (ref 134–144)
Total Protein: 6.8 g/dL (ref 6.0–8.5)
eGFR: 96 mL/min/{1.73_m2} (ref >59)

## 2021-09-06 ENCOUNTER — Encounter: Payer: Self-pay | Admitting: Nurse Practitioner

## 2021-09-06 NOTE — Assessment & Plan Note (Signed)
-   amLODipine (NORVASC) 5 MG tablet; Take 1 tablet (5 mg total) by mouth daily.  Dispense: 90 tablet; Refill: 3 - aspirin 325 MG tablet; Take 1 tablet (325 mg total) by mouth daily.  Dispense: 90 tablet; Refill: 2 - atorvastatin (LIPITOR) 80 MG tablet; Take 1 tablet (80 mg total) by mouth daily.  Dispense: 90 tablet; Refill: 3 - metoprolol tartrate (LOPRESSOR) 50 MG tablet; Take 1 tablet (50 mg total) by mouth 2 (two) times daily.  Dispense: 180 tablet; Refill: 3 - Lipid Panel - CBC - Comprehensive metabolic panel  -low salt diet  -stay active  Follow up:  Follow up in 6 months

## 2021-12-07 ENCOUNTER — Other Ambulatory Visit (HOSPITAL_COMMUNITY): Payer: Self-pay

## 2022-03-02 ENCOUNTER — Other Ambulatory Visit (HOSPITAL_COMMUNITY): Payer: Self-pay

## 2022-03-04 ENCOUNTER — Other Ambulatory Visit (HOSPITAL_COMMUNITY): Payer: Self-pay

## 2022-03-07 ENCOUNTER — Other Ambulatory Visit (HOSPITAL_COMMUNITY): Payer: Self-pay

## 2022-03-07 ENCOUNTER — Ambulatory Visit (INDEPENDENT_AMBULATORY_CARE_PROVIDER_SITE_OTHER): Payer: Commercial Managed Care - HMO | Admitting: Nurse Practitioner

## 2022-03-07 ENCOUNTER — Encounter: Payer: Self-pay | Admitting: Nurse Practitioner

## 2022-03-07 VITALS — BP 140/98 | HR 86 | Ht 62.0 in | Wt 172.0 lb

## 2022-03-07 DIAGNOSIS — Z23 Encounter for immunization: Secondary | ICD-10-CM | POA: Diagnosis not present

## 2022-03-07 DIAGNOSIS — I10 Essential (primary) hypertension: Secondary | ICD-10-CM | POA: Diagnosis not present

## 2022-03-07 NOTE — Progress Notes (Signed)
@Patient  ID: , male    DOB: [birth year], 56 y.o.   MRN: [id number]  Chief Complaint  Patient presents with   Follow-up    Referring provider: [id number], NP   HPI  Brad Tran presents for follow up. A former patient of NP Stroud. He  has a past medical history of Hyperlipidemia, Hypertension, Impaired ambulation, Right sided weakness, Stroke (HCC), and Vitamin D deficiency (06/2020).    Hypertension  Patient is here for follow-up of elevated blood pressure. He is exercising and is adherent to a low-salt diet. Blood pressure is well controlled at home. Cardiac symptoms: none. Patient denies chest pain, chest pressure/discomfort, claudication, dyspnea, exertional chest pressure/discomfort, fatigue, irregular heart beat, lower extremity edema, near-syncope, orthopnea, palpitations, paroxysmal nocturnal dyspnea, syncope, and tachypnea. Cardiovascular risk factors: advanced age (older than 33 for men, 45 for women), family history of premature cardiovascular disease, hypertension, male gender, and obesity (BMI >= 30 kg/m2). Use of agents associated with hypertension: none. History of target organ damage: stroke.   Note: Patient's blood pressure tends to be elevated on office visits.  We will have the pharmacist follow-up with the patient to see if this is may be due to whitecoat syndrome.  No Known Allergies  Immunization History  Administered Date(s) Administered   Influenza,inj,Quad PF,6+ Mos 03/17/2016, 03/05/2018, 03/07/2022   Tdap 03/17/2016    Past Medical History:  Diagnosis Date   Hyperlipidemia    Hypertension    Impaired ambulation    Right sided weakness    Stroke (HCC) 2016   Vitamin D deficiency 06/2020    Tobacco History: Social History   Tobacco Use  Smoking Status Never  Smokeless Tobacco Never   Counseling given: Not Answered   Outpatient Encounter Medications as of 03/07/2022  Medication Sig   amLODipine (NORVASC) 5 MG tablet Take 1  tablet (5 mg total) by mouth daily.   aspirin 325 MG tablet Take 1 tablet (325 mg total) by mouth daily.   atorvastatin (LIPITOR) 80 MG tablet Take 1 tablet (80 mg total) by mouth daily.   metoprolol tartrate (LOPRESSOR) 50 MG tablet Take 1 tablet (50 mg total) by mouth 2 (two) times daily.   No facility-administered encounter medications on file as of 03/07/2022.     Review of Systems  Review of Systems  Constitutional: Negative.   HENT: Negative.    Cardiovascular: Negative.   Gastrointestinal: Negative.   Allergic/Immunologic: Negative.   Neurological: Negative.   Psychiatric/Behavioral: Negative.         Physical Exam  BP (!) 140/98   Pulse 86   Ht 5\' 2"  (1.575 m)   Wt 172 lb (78 kg)   SpO2 97%   BMI 31.46 kg/m   Wt Readings from Last 5 Encounters:  03/07/22 172 lb (78 kg)  09/03/21 168 lb 12.8 oz (76.6 kg)  08/11/21 166 lb (75.3 kg)  12/07/20 167 lb 0.8 oz (75.8 kg)  07/07/20 182 lb (82.6 kg)     Physical Exam Vitals and nursing note reviewed.  Constitutional:      General: He is not in acute distress.    Appearance: He is well-developed.  Cardiovascular:     Rate and Rhythm: Normal rate and regular rhythm.  Pulmonary:     Effort: Pulmonary effort is normal.     Breath sounds: Normal breath sounds.  Skin:    General: Skin is warm and dry.  Neurological:     Mental Status: He is alert and oriented to  person, place, and time.      Lab Results:  CBC    Component Value Date/Time   WBC 3.8 09/03/2021 1217   WBC 6.9 08/27/2016 0310   RBC 4.83 09/03/2021 1217   RBC 5.02 08/27/2016 0310   HGB 14.3 09/03/2021 1217   HCT 42.4 09/03/2021 1217   PLT 222 09/03/2021 1217   MCV 88 09/03/2021 1217   MCH 29.6 09/03/2021 1217   MCH 29.9 08/27/2016 0310   MCHC 33.7 09/03/2021 1217   MCHC 34.1 08/27/2016 0310   RDW 13.0 09/03/2021 1217   LYMPHSABS 1.0 07/07/2020 0928   MONOABS 0.4 10/28/2015 0718   EOSABS 0.2 07/07/2020 0928   BASOSABS 0.0 07/07/2020  0928    BMET    Component Value Date/Time   NA 140 09/03/2021 1217   K 4.2 09/03/2021 1217   CL 104 09/03/2021 1217   CO2 24 09/03/2021 1217   GLUCOSE 95 09/03/2021 1217   GLUCOSE 140 (H) 08/27/2016 0310   BUN 19 09/03/2021 1217   CREATININE 0.93 09/03/2021 1217   CREATININE 1.03 07/29/2016 0836   CALCIUM 9.3 09/03/2021 1217   GFRNONAA 61 10/23/2019 1216   GFRAA 71 10/23/2019 1216      Assessment & Plan:   Essential hypertension - CBC - Comprehensive metabolic panel - AMB Referral to Pharmacy Medication Management  Follow up:  Follow up in 6 months     Brad Andrew, NP 03/07/2022

## 2022-03-07 NOTE — Patient Instructions (Addendum)
1. Essential hypertension  - CBC - Comprehensive metabolic panel - AMB Referral to Pharmacy Medication Management  Follow up:  Follow up in 6 months

## 2022-03-07 NOTE — Assessment & Plan Note (Signed)
-   CBC - Comprehensive metabolic panel - AMB Referral to Pharmacy Medication Management  Follow up:  Follow up in 6 months

## 2022-03-08 LAB — COMPREHENSIVE METABOLIC PANEL
ALT: 24 IU/L (ref 0–44)
AST: 18 IU/L (ref 0–40)
Albumin/Globulin Ratio: 2.4 — ABNORMAL HIGH (ref 1.2–2.2)
Albumin: 4.4 g/dL (ref 3.8–4.9)
Alkaline Phosphatase: 66 IU/L (ref 44–121)
BUN/Creatinine Ratio: 20 (ref 9–20)
BUN: 19 mg/dL (ref 6–24)
Bilirubin Total: 0.3 mg/dL (ref 0.0–1.2)
CO2: 23 mmol/L (ref 20–29)
Calcium: 9 mg/dL (ref 8.7–10.2)
Chloride: 104 mmol/L (ref 96–106)
Creatinine, Ser: 0.93 mg/dL (ref 0.76–1.27)
Globulin, Total: 1.8 g/dL (ref 1.5–4.5)
Glucose: 155 mg/dL — ABNORMAL HIGH (ref 70–99)
Potassium: 3.6 mmol/L (ref 3.5–5.2)
Sodium: 140 mmol/L (ref 134–144)
Total Protein: 6.2 g/dL (ref 6.0–8.5)
eGFR: 96 mL/min/{1.73_m2} (ref >59)

## 2022-03-08 LAB — CBC
Hematocrit: 39.5 % (ref 37.5–51.0)
Hemoglobin: 13.3 g/dL (ref 13.0–17.7)
MCH: 29.8 pg (ref 26.6–33.0)
MCHC: 33.7 g/dL (ref 31.5–35.7)
MCV: 89 fL (ref 79–97)
Platelets: 220 10*3/uL (ref 150–450)
RBC: 4.46 x10E6/uL (ref 4.14–5.80)
RDW: 13 % (ref 11.6–15.4)
WBC: 4.4 10*3/uL (ref 3.4–10.8)

## 2022-03-11 ENCOUNTER — Other Ambulatory Visit (HOSPITAL_COMMUNITY): Payer: Self-pay

## 2022-03-14 ENCOUNTER — Other Ambulatory Visit (HOSPITAL_COMMUNITY): Payer: Self-pay

## 2022-03-17 ENCOUNTER — Telehealth: Payer: Self-pay

## 2022-03-17 NOTE — Progress Notes (Signed)
   Care Guide Note  03/17/2022 Name: Brad Tran MRN: [id number] DOB: 09-25-1965  Referred by: Ivonne Andrew, NP Reason for referral : Care Coordination (Outreach to schedule with Pharm D )   Brad Tran is a 56 y.o. year old male who is a primary care patient of Ivonne Andrew, NP. Brad Tran was referred to the pharmacist for assistance related to DM.    An unsuccessful telephone outreach was attempted today to contact the patient who was referred to the pharmacy team for assistance with medication management. Additional attempts will be made to contact the patient.   Penne Lash, RMA Care Guide [facility]  [place], [place] [address] Direct Dial: [phone] [email or web address]

## 2022-03-23 NOTE — Progress Notes (Signed)
   Care Guide Note  03/23/2022 Name: Ramiel Forti MRN: [id number] DOB: 08/05/1965  Referred by: Ivonne Andrew, NP Reason for referral : Care Coordination (Outreach to schedule with Pharm D )   Grayling Bumgarner is a 56 y.o. year old male who is a primary care patient of Ivonne Andrew, NP. Barnaby Murray was referred to the pharmacist for assistance related to DM.    A second unsuccessful telephone outreach was attempted today to contact the patient who was referred to the pharmacy team for assistance with medication management. Additional attempts will be made to contact the patient.  Penne Lash, RMA Care Guide [facility]  [place], [place] [address] Direct Dial: [phone] [email or web address]

## 2022-04-06 NOTE — Progress Notes (Signed)
   Care Guide Note  04/06/2022 Name: Brad Tran MRN: [id number] DOB: January 15, 1966  Referred by: Ivonne Andrew, NP Reason for referral : Care Coordination (Outreach to schedule with Pharm D )   Brad Tran is a 56 y.o. year old male who is a primary care patient of Ivonne Andrew, NP. Brad Tran was referred to the pharmacist for assistance related to DM.    A third unsuccessful telephone outreach was attempted today to contact the patient who was referred to the pharmacy team for assistance with medication assistance. The Population Health team is pleased to engage with this patient at any time in the future upon receipt of referral and should he/she be interested in assistance from the [facility] team.   Penne Lash, RMA Care Guide [facility]  [place], [place] [address] Direct Dial: [phone] [email or web address]

## 2022-06-27 ENCOUNTER — Other Ambulatory Visit (HOSPITAL_COMMUNITY): Payer: Self-pay

## 2022-07-04 ENCOUNTER — Other Ambulatory Visit (HOSPITAL_COMMUNITY): Payer: Self-pay

## 2022-09-05 ENCOUNTER — Other Ambulatory Visit (HOSPITAL_COMMUNITY): Payer: Self-pay

## 2022-09-05 ENCOUNTER — Ambulatory Visit (INDEPENDENT_AMBULATORY_CARE_PROVIDER_SITE_OTHER): Payer: 59 | Admitting: Nurse Practitioner

## 2022-09-05 ENCOUNTER — Encounter: Payer: Self-pay | Admitting: Nurse Practitioner

## 2022-09-05 VITALS — BP 131/78 | HR 62 | Temp 97.2°F | Wt 171.4 lb

## 2022-09-05 DIAGNOSIS — Z131 Encounter for screening for diabetes mellitus: Secondary | ICD-10-CM | POA: Diagnosis not present

## 2022-09-05 DIAGNOSIS — Z1211 Encounter for screening for malignant neoplasm of colon: Secondary | ICD-10-CM

## 2022-09-05 DIAGNOSIS — I10 Essential (primary) hypertension: Secondary | ICD-10-CM | POA: Diagnosis not present

## 2022-09-05 DIAGNOSIS — Z13220 Encounter for screening for lipoid disorders: Secondary | ICD-10-CM

## 2022-09-05 MED ORDER — AMLODIPINE BESYLATE 5 MG PO TABS
5.0000 mg | ORAL_TABLET | Freq: Every day | ORAL | 3 refills | Status: DC
  Filled 2022-09-05: qty 30, 30d supply, fill #0
  Filled 2022-10-12: qty 30, 30d supply, fill #1
  Filled 2022-11-14: qty 30, 30d supply, fill #2
  Filled 2022-12-21: qty 30, 30d supply, fill #3
  Filled 2023-01-27: qty 30, 30d supply, fill #4

## 2022-09-05 MED ORDER — METOPROLOL TARTRATE 50 MG PO TABS
50.0000 mg | ORAL_TABLET | Freq: Two times a day (BID) | ORAL | 3 refills | Status: DC
  Filled 2022-09-05: qty 60, 30d supply, fill #0
  Filled 2022-10-12: qty 60, 30d supply, fill #1
  Filled 2022-11-14: qty 60, 30d supply, fill #2
  Filled 2022-12-21: qty 60, 30d supply, fill #3
  Filled 2023-01-27: qty 60, 30d supply, fill #4

## 2022-09-05 NOTE — Assessment & Plan Note (Signed)
-   metoprolol tartrate (LOPRESSOR) 50 MG tablet; Take 1 tablet (50 mg total) by mouth 2 (two) times daily.  Dispense: 180 tablet; Refill: 3 - amLODipine (NORVASC) 5 MG tablet; Take 1 tablet (5 mg total) by mouth daily.  Dispense: 90 tablet; Refill: 3 - CBC - Comprehensive metabolic panel  2. Diabetes mellitus screening  - Hemoglobin A1c  3. Colon cancer screening  - Cologuard  4. Lipid screening  - Lipid Panel  Follow up:  Follow up in 6 months

## 2022-09-05 NOTE — Progress Notes (Signed)
@Patient  ID: Brad Tran, male    DOB: Oct 31, 1965, 57 y.o.   MRN: [id number]  Chief Complaint  Patient presents with   Hypertension    Follow up    Referring provider: Ivonne Andrew, NP   HPI  Brad Tran presents for follow up. A former patient of NP Stroud. He  has a past medical history of Hyperlipidemia, Hypertension, Impaired ambulation, Right sided weakness, Stroke (HCC), and Vitamin D deficiency (06/2020).     Hypertension   Patient is here for follow-up of elevated blood pressure. He is exercising and is adherent to a low-salt diet. Blood pressure is well controlled at home. Cardiac symptoms: none. Patient denies chest pain, chest pressure/discomfort, claudication, dyspnea, exertional chest pressure/discomfort, fatigue, irregular heart beat, lower extremity edema, near-syncope, orthopnea, palpitations, paroxysmal nocturnal dyspnea, syncope, and tachypnea. Cardiovascular risk factors: advanced age (older than 7 for men, 17 for women), family history of premature cardiovascular disease, hypertension, male gender, and obesity (BMI >= 30 kg/m2). Use of agents associated with hypertension: none. History of target organ damage: stroke.  Patient's blood sugar was noted to be elevated with last labs.  We will check A1c today.  Patient is due for lipid screening today.  Patient is due for colon cancer screening.   Note: Patient's blood pressure tends to be elevated on office visits.  We will have the pharmacist follow-up with the patient to see if this is may be due to whitecoat syndrome.      No Known Allergies  Immunization History  Administered Date(s) Administered   Influenza,inj,Quad PF,6+ Mos 03/17/2016, 03/05/2018, 03/07/2022   Tdap 03/17/2016    Past Medical History:  Diagnosis Date   Hyperlipidemia    Hypertension    Impaired ambulation    Right sided weakness    Stroke (HCC) 2016   Vitamin D deficiency 06/2020    Tobacco History: Social History   Tobacco  Use  Smoking Status Never  Smokeless Tobacco Never   Counseling given: Not Answered   Outpatient Encounter Medications as of 09/05/2022  Medication Sig   aspirin 325 MG tablet Take 1 tablet (325 mg total) by mouth daily.   atorvastatin (LIPITOR) 80 MG tablet Take 1 tablet (80 mg total) by mouth daily.   [DISCONTINUED] amLODipine (NORVASC) 5 MG tablet Take 1 tablet (5 mg total) by mouth daily.   [DISCONTINUED] metoprolol tartrate (LOPRESSOR) 50 MG tablet Take 1 tablet (50 mg total) by mouth 2 (two) times daily.   amLODipine (NORVASC) 5 MG tablet Take 1 tablet (5 mg total) by mouth daily.   metoprolol tartrate (LOPRESSOR) 50 MG tablet Take 1 tablet (50 mg total) by mouth 2 (two) times daily.   No facility-administered encounter medications on file as of 09/05/2022.     Review of Systems  Review of Systems  Constitutional: Negative.   HENT: Negative.    Cardiovascular: Negative.   Gastrointestinal: Negative.   Allergic/Immunologic: Negative.   Neurological: Negative.   Psychiatric/Behavioral: Negative.         Physical Exam  BP 131/78   Pulse 62   Temp (!) 97.2 F (36.2 C)   Wt 171 lb 6.4 oz (77.7 kg)   SpO2 98%   BMI 31.35 kg/m   Wt Readings from Last 5 Encounters:  09/05/22 171 lb 6.4 oz (77.7 kg)  03/07/22 172 lb (78 kg)  09/03/21 168 lb 12.8 oz (76.6 kg)  08/11/21 166 lb (75.3 kg)  12/07/20 167 lb 0.8 oz (75.8 kg)  Physical Exam Vitals and nursing note reviewed.  Constitutional:      General: He is not in acute distress.    Appearance: He is well-developed.  Cardiovascular:     Rate and Rhythm: Normal rate and regular rhythm.  Pulmonary:     Effort: Pulmonary effort is normal.     Breath sounds: Normal breath sounds.  Skin:    General: Skin is warm and dry.  Neurological:     Mental Status: He is alert and oriented to person, place, and time.      Lab Results:  CBC    Component Value Date/Time   WBC 4.4 03/07/2022 1217   WBC 6.9  08/27/2016 0310   RBC 4.46 03/07/2022 1217   RBC 5.02 08/27/2016 0310   HGB 13.3 03/07/2022 1217   HCT 39.5 03/07/2022 1217   PLT 220 03/07/2022 1217   MCV 89 03/07/2022 1217   MCH 29.8 03/07/2022 1217   MCH 29.9 08/27/2016 0310   MCHC 33.7 03/07/2022 1217   MCHC 34.1 08/27/2016 0310   RDW 13.0 03/07/2022 1217   LYMPHSABS 1.0 07/07/2020 0928   MONOABS 0.4 10/28/2015 0718   EOSABS 0.2 07/07/2020 0928   BASOSABS 0.0 07/07/2020 0928    BMET    Component Value Date/Time   NA 140 03/07/2022 1217   K 3.6 03/07/2022 1217   CL 104 03/07/2022 1217   CO2 23 03/07/2022 1217   GLUCOSE 155 (H) 03/07/2022 1217   GLUCOSE 140 (H) 08/27/2016 0310   BUN 19 03/07/2022 1217   CREATININE 0.93 03/07/2022 1217   CREATININE 1.03 07/29/2016 0836   CALCIUM 9.0 03/07/2022 1217   GFRNONAA 61 10/23/2019 1216   GFRAA 71 10/23/2019 1216     Assessment & Plan:   Hypertension - metoprolol tartrate (LOPRESSOR) 50 MG tablet; Take 1 tablet (50 mg total) by mouth 2 (two) times daily.  Dispense: 180 tablet; Refill: 3 - amLODipine (NORVASC) 5 MG tablet; Take 1 tablet (5 mg total) by mouth daily.  Dispense: 90 tablet; Refill: 3 - CBC - Comprehensive metabolic panel  2. Diabetes mellitus screening  - Hemoglobin A1c  3. Colon cancer screening  - Cologuard  4. Lipid screening  - Lipid Panel  Follow up:  Follow up in 6 months     Ivonne Andrew, NP 09/05/2022

## 2022-09-05 NOTE — Patient Instructions (Signed)
1. Hypertension, unspecified type  - metoprolol tartrate (LOPRESSOR) 50 MG tablet; Take 1 tablet (50 mg total) by mouth 2 (two) times daily.  Dispense: 180 tablet; Refill: 3 - amLODipine (NORVASC) 5 MG tablet; Take 1 tablet (5 mg total) by mouth daily.  Dispense: 90 tablet; Refill: 3 - CBC - Comprehensive metabolic panel  2. Diabetes mellitus screening  - Hemoglobin A1c  3. Colon cancer screening  - Cologuard  4. Lipid screening  - Lipid Panel  Follow up:  Follow up in 6 months

## 2022-09-06 LAB — COMPREHENSIVE METABOLIC PANEL
ALT: 20 IU/L (ref 0–44)
AST: 18 IU/L (ref 0–40)
Albumin/Globulin Ratio: 2.6 — ABNORMAL HIGH (ref 1.2–2.2)
Albumin: 4.6 g/dL (ref 3.8–4.9)
Alkaline Phosphatase: 63 IU/L (ref 44–121)
BUN/Creatinine Ratio: 17 (ref 9–20)
BUN: 17 mg/dL (ref 6–24)
Bilirubin Total: 0.6 mg/dL (ref 0.0–1.2)
CO2: 21 mmol/L (ref 20–29)
Calcium: 9.2 mg/dL (ref 8.7–10.2)
Chloride: 106 mmol/L (ref 96–106)
Creatinine, Ser: 1 mg/dL (ref 0.76–1.27)
Globulin, Total: 1.8 g/dL (ref 1.5–4.5)
Glucose: 91 mg/dL (ref 70–99)
Potassium: 3.9 mmol/L (ref 3.5–5.2)
Sodium: 141 mmol/L (ref 134–144)
Total Protein: 6.4 g/dL (ref 6.0–8.5)
eGFR: 88 mL/min/{1.73_m2} (ref >59)

## 2022-09-06 LAB — HEMOGLOBIN A1C
Est. average glucose Bld gHb Est-mCnc: 120 mg/dL
Hgb A1c MFr Bld: 5.8 % — ABNORMAL HIGH (ref 4.8–5.6)

## 2022-09-06 LAB — LIPID PANEL
Chol/HDL Ratio: 2.8 ratio (ref 0.0–5.0)
Cholesterol, Total: 144 mg/dL (ref 100–199)
HDL: 51 mg/dL (ref >39)
LDL Chol Calc (NIH): 80 mg/dL (ref 0–99)
Triglycerides: 62 mg/dL (ref 0–149)
VLDL Cholesterol Cal: 13 mg/dL (ref 5–40)

## 2022-09-06 LAB — CBC
Hematocrit: 41.3 % (ref 37.5–51.0)
Hemoglobin: 13.9 g/dL (ref 13.0–17.7)
MCH: 29.1 pg (ref 26.6–33.0)
MCHC: 33.7 g/dL (ref 31.5–35.7)
MCV: 87 fL (ref 79–97)
Platelets: 205 10*3/uL (ref 150–450)
RBC: 4.77 x10E6/uL (ref 4.14–5.80)
RDW: 13.4 % (ref 11.6–15.4)
WBC: 3.8 10*3/uL (ref 3.4–10.8)

## 2022-09-08 ENCOUNTER — Other Ambulatory Visit (HOSPITAL_COMMUNITY): Payer: Self-pay

## 2022-10-12 ENCOUNTER — Other Ambulatory Visit: Payer: Self-pay | Admitting: Nurse Practitioner

## 2022-10-12 DIAGNOSIS — I10 Essential (primary) hypertension: Secondary | ICD-10-CM

## 2022-10-13 ENCOUNTER — Other Ambulatory Visit: Payer: Self-pay

## 2022-10-13 ENCOUNTER — Other Ambulatory Visit (HOSPITAL_COMMUNITY): Payer: Self-pay

## 2022-10-13 MED ORDER — ATORVASTATIN CALCIUM 80 MG PO TABS
80.0000 mg | ORAL_TABLET | Freq: Every day | ORAL | 3 refills | Status: DC
  Filled 2022-10-13: qty 30, 30d supply, fill #0
  Filled 2022-11-14: qty 30, 30d supply, fill #1
  Filled 2022-12-21: qty 30, 30d supply, fill #2
  Filled 2023-01-27: qty 30, 30d supply, fill #3

## 2022-12-22 ENCOUNTER — Other Ambulatory Visit (HOSPITAL_COMMUNITY): Payer: Self-pay

## 2023-01-27 ENCOUNTER — Other Ambulatory Visit: Payer: Self-pay

## 2023-01-27 ENCOUNTER — Other Ambulatory Visit (HOSPITAL_COMMUNITY): Payer: Self-pay

## 2023-02-01 ENCOUNTER — Other Ambulatory Visit (HOSPITAL_COMMUNITY): Payer: Self-pay

## 2023-03-08 ENCOUNTER — Ambulatory Visit (INDEPENDENT_AMBULATORY_CARE_PROVIDER_SITE_OTHER): Payer: Self-pay | Admitting: Nurse Practitioner

## 2023-03-08 ENCOUNTER — Encounter: Payer: Self-pay | Admitting: Nurse Practitioner

## 2023-03-08 ENCOUNTER — Other Ambulatory Visit (HOSPITAL_COMMUNITY): Payer: Self-pay

## 2023-03-08 VITALS — BP 126/75 | HR 73 | Temp 98.7°F | Resp 12 | Ht 65.0 in | Wt 176.0 lb

## 2023-03-08 DIAGNOSIS — I10 Essential (primary) hypertension: Secondary | ICD-10-CM | POA: Diagnosis not present

## 2023-03-08 DIAGNOSIS — Z131 Encounter for screening for diabetes mellitus: Secondary | ICD-10-CM | POA: Diagnosis not present

## 2023-03-08 LAB — POCT GLYCOSYLATED HEMOGLOBIN (HGB A1C): Hemoglobin A1C: 5.5 % (ref 4.0–5.6)

## 2023-03-08 MED ORDER — METOPROLOL TARTRATE 50 MG PO TABS
50.0000 mg | ORAL_TABLET | Freq: Two times a day (BID) | ORAL | 3 refills | Status: DC
  Filled 2023-03-08: qty 180, 90d supply, fill #0
  Filled 2023-06-05: qty 180, 90d supply, fill #1

## 2023-03-08 MED ORDER — AMLODIPINE BESYLATE 5 MG PO TABS
5.0000 mg | ORAL_TABLET | Freq: Every day | ORAL | 3 refills | Status: DC
  Filled 2023-03-08: qty 90, 90d supply, fill #0
  Filled 2023-06-05: qty 90, 90d supply, fill #1

## 2023-03-08 MED ORDER — ASPIRIN 325 MG PO TABS
325.0000 mg | ORAL_TABLET | Freq: Every day | ORAL | 2 refills | Status: AC
  Filled 2023-03-08: qty 100, 100d supply, fill #0

## 2023-03-08 MED ORDER — ATORVASTATIN CALCIUM 80 MG PO TABS
80.0000 mg | ORAL_TABLET | Freq: Every day | ORAL | 3 refills | Status: DC
Start: 2023-03-08 — End: 2023-09-08
  Filled 2023-03-08: qty 90, 90d supply, fill #0
  Filled 2023-06-05: qty 90, 90d supply, fill #1

## 2023-03-08 NOTE — Progress Notes (Signed)
Subjective   Patient ID: Brad Tran, male    DOB: Oct 18, 1965, 57 y.o.   MRN: [id number]  Chief Complaint  Patient presents with   Follow-up    Referring provider: Ivonne Andrew, NP  Brad Tran is a 57 y.o. male with Past Medical History: No date: Hyperlipidemia No date: Hypertension No date: Impaired ambulation No date: Right sided weakness 2016: Stroke (HCC) 06/2020: Vitamin D deficiency  HPI  Hypertension   Patient is here for follow-up of elevated blood pressure. He is exercising and is adherent to a low-salt diet. Blood pressure is well controlled at home. Cardiac symptoms: none. Patient denies chest pain, chest pressure/discomfort, claudication, dyspnea, exertional chest pressure/discomfort, fatigue, irregular heart beat, lower extremity edema, near-syncope, orthopnea, palpitations, paroxysmal nocturnal dyspnea, syncope, and tachypnea. Cardiovascular risk factors: advanced age (older than 85 for men, 72 for women), family history of premature cardiovascular disease, hypertension, male gender, and obesity (BMI >= 30 kg/m2). Use of agents associated with hypertension: none. History of target organ damage: stroke.  Patient's blood sugar was noted to be elevated with last labs.  A1C is 5.5 today. Denies f/c/s, n/v/d, hemoptysis, PND, leg swelling Denies chest pain or edema     No Known Allergies  Immunization History  Administered Date(s) Administered   Influenza,inj,Quad PF,6+ Mos 03/17/2016, 03/05/2018, 03/07/2022   Tdap 03/17/2016    Tobacco History: Social History   Tobacco Use  Smoking Status Never  Smokeless Tobacco Never   Counseling given: Not Answered   Outpatient Encounter Medications as of 03/08/2023  Medication Sig   [DISCONTINUED] amLODipine (NORVASC) 5 MG tablet Take 1 tablet (5 mg total) by mouth daily.   [DISCONTINUED] aspirin 325 MG tablet Take 1 tablet (325 mg total) by mouth daily.   [DISCONTINUED] atorvastatin (LIPITOR) 80 MG tablet Take 1  tablet (80 mg total) by mouth daily.   [DISCONTINUED] metoprolol tartrate (LOPRESSOR) 50 MG tablet Take 1 tablet (50 mg total) by mouth 2 (two) times daily.   amLODipine (NORVASC) 5 MG tablet Take 1 tablet (5 mg total) by mouth daily.   aspirin 325 MG tablet Take 1 tablet (325 mg total) by mouth daily.   atorvastatin (LIPITOR) 80 MG tablet Take 1 tablet (80 mg total) by mouth daily.   metoprolol tartrate (LOPRESSOR) 50 MG tablet Take 1 tablet (50 mg total) by mouth 2 (two) times daily.   No facility-administered encounter medications on file as of 03/08/2023.    Review of Systems  Review of Systems  Constitutional: Negative.   HENT: Negative.    Cardiovascular: Negative.   Gastrointestinal: Negative.   Allergic/Immunologic: Negative.   Neurological: Negative.   Psychiatric/Behavioral: Negative.       Objective:   BP 126/75   Pulse 73   Temp 98.7 F (37.1 C)   Resp 12   Ht 5\' 5"  (1.651 m)   Wt 176 lb (79.8 kg)   SpO2 99%   BMI 29.29 kg/m   Wt Readings from Last 5 Encounters:  03/08/23 176 lb (79.8 kg)  09/05/22 171 lb 6.4 oz (77.7 kg)  03/07/22 172 lb (78 kg)  09/03/21 168 lb 12.8 oz (76.6 kg)  08/11/21 166 lb (75.3 kg)     Physical Exam Vitals and nursing note reviewed.  Constitutional:      General: He is not in acute distress.    Appearance: He is well-developed.  Cardiovascular:     Rate and Rhythm: Normal rate and regular rhythm.  Pulmonary:     Effort: Pulmonary  effort is normal.     Breath sounds: Normal breath sounds.  Skin:    General: Skin is warm and dry.  Neurological:     Mental Status: He is alert and oriented to person, place, and time.       Assessment & Plan:   Diabetes mellitus screening -     POCT glycosylated hemoglobin (Hb A1C)  Hypertension, unspecified type -     amLODIPine Besylate; Take 1 tablet (5 mg total) by mouth daily.  Dispense: 90 tablet; Refill: 3 -     Aspirin; Take 1 tablet (325 mg total) by mouth daily.   Dispense: 90 tablet; Refill: 2 -     Atorvastatin Calcium; Take 1 tablet (80 mg total) by mouth daily.  Dispense: 90 tablet; Refill: 3 -     Metoprolol Tartrate; Take 1 tablet (50 mg total) by mouth 2 (two) times daily.  Dispense: 180 tablet; Refill: 3     Return in about 6 months (around 09/05/2023).   Ivonne Andrew, NP 03/08/2023

## 2023-03-08 NOTE — Patient Instructions (Addendum)
1. Diabetes mellitus screening  - POCT glycosylated hemoglobin (Hb A1C)  2. Hypertension, unspecified type  - amLODipine (NORVASC) 5 MG tablet; Take 1 tablet (5 mg total) by mouth daily.  Dispense: 90 tablet; Refill: 3 - aspirin 325 MG tablet; Take 1 tablet (325 mg total) by mouth daily.  Dispense: 90 tablet; Refill: 2 - atorvastatin (LIPITOR) 80 MG tablet; Take 1 tablet (80 mg total) by mouth daily.  Dispense: 90 tablet; Refill: 3 - metoprolol tartrate (LOPRESSOR) 50 MG tablet; Take 1 tablet (50 mg total) by mouth 2 (two) times daily.  Dispense: 180 tablet; Refill: 3   Follow up:  Follow up in 6 months

## 2023-03-09 ENCOUNTER — Other Ambulatory Visit (HOSPITAL_COMMUNITY): Payer: Self-pay

## 2023-03-21 ENCOUNTER — Other Ambulatory Visit (HOSPITAL_COMMUNITY): Payer: Self-pay

## 2023-06-05 ENCOUNTER — Other Ambulatory Visit (HOSPITAL_COMMUNITY): Payer: Self-pay

## 2023-08-06 ENCOUNTER — Emergency Department (HOSPITAL_COMMUNITY)
Admission: EM | Admit: 2023-08-06 | Discharge: 2023-08-06 | Disposition: A | Discharge status: Home / Self Care | Payer: Self-pay | Attending: Emergency Medicine | Admitting: Emergency Medicine

## 2023-08-06 ENCOUNTER — Encounter (HOSPITAL_COMMUNITY): Payer: Self-pay

## 2023-08-06 ENCOUNTER — Other Ambulatory Visit: Payer: Self-pay

## 2023-08-06 DIAGNOSIS — I10 Essential (primary) hypertension: Secondary | ICD-10-CM | POA: Insufficient documentation

## 2023-08-06 DIAGNOSIS — Z7982 Long term (current) use of aspirin: Secondary | ICD-10-CM | POA: Insufficient documentation

## 2023-08-06 DIAGNOSIS — Z79899 Other long term (current) drug therapy: Secondary | ICD-10-CM | POA: Insufficient documentation

## 2023-08-06 DIAGNOSIS — K4090 Unilateral inguinal hernia, without obstruction or gangrene, not specified as recurrent: Secondary | ICD-10-CM | POA: Insufficient documentation

## 2023-08-06 LAB — COMPREHENSIVE METABOLIC PANEL WITH GFR
ALT: 33 U/L (ref 0–44)
AST: 22 U/L (ref 15–41)
Albumin: 4.5 g/dL (ref 3.5–5.0)
Alkaline Phosphatase: 55 U/L (ref 38–126)
Anion gap: 8 (ref 5–15)
BUN: 28 mg/dL — ABNORMAL HIGH (ref 6–20)
CO2: 23 mmol/L (ref 22–32)
Calcium: 8.8 mg/dL — ABNORMAL LOW (ref 8.9–10.3)
Chloride: 106 mmol/L (ref 98–111)
Creatinine, Ser: 1.04 mg/dL (ref 0.61–1.24)
GFR, Estimated: >60 mL/min (ref >60)
Glucose, Bld: 101 mg/dL — ABNORMAL HIGH (ref 70–99)
Potassium: 3.9 mmol/L (ref 3.5–5.1)
Sodium: 137 mmol/L (ref 135–145)
Total Bilirubin: 0.8 mg/dL (ref 0.0–1.2)
Total Protein: 7.5 g/dL (ref 6.5–8.1)

## 2023-08-06 LAB — CBC WITH DIFFERENTIAL/PLATELET
Abs Immature Granulocytes: 0.01 10*3/uL (ref 0.00–0.07)
Basophils Absolute: 0 10*3/uL (ref 0.0–0.1)
Basophils Relative: 1 %
Eosinophils Absolute: 0.2 10*3/uL (ref 0.0–0.5)
Eosinophils Relative: 5 %
HCT: 46.3 % (ref 39.0–52.0)
Hemoglobin: 15.1 g/dL (ref 13.0–17.0)
Immature Granulocytes: 0 %
Lymphocytes Relative: 30 %
Lymphs Abs: 1.5 10*3/uL (ref 0.7–4.0)
MCH: 29.6 pg (ref 26.0–34.0)
MCHC: 32.6 g/dL (ref 30.0–36.0)
MCV: 90.8 fL (ref 80.0–100.0)
Monocytes Absolute: 0.5 10*3/uL (ref 0.1–1.0)
Monocytes Relative: 10 %
Neutro Abs: 2.8 10*3/uL (ref 1.7–7.7)
Neutrophils Relative %: 54 %
Platelets: 204 10*3/uL (ref 150–400)
RBC: 5.1 MIL/uL (ref 4.22–5.81)
RDW: 13 % (ref 11.5–15.5)
WBC: 5 10*3/uL (ref 4.0–10.5)
nRBC: 0 % (ref 0.0–0.2)

## 2023-08-06 NOTE — ED Triage Notes (Signed)
 Pt. Arrives c/o groin pain. States that he was lifting something heavy and feels like his intestine fell out. States that he pushed it back in, but it is hurting. He looked his symptoms up online and states that he thinks he has a hernia.

## 2023-08-06 NOTE — Discharge Instructions (Signed)
 Wear hernia belt. If hernia is painful, lie down. Apply ice pack to area. Apply gentle pressure to area. If pain does not ease and is severe, return to the ER. Otherwise, follow up with general surgery, call to schedule an appointment in the morning.

## 2023-08-06 NOTE — ED Provider Notes (Signed)
  EMERGENCY DEPARTMENT AT [facility] Provider Note   CSN: [id number] Arrival date & time: 08/06/23  1806     History  Chief Complaint  Patient presents with   Groin Pain    Erico Gilliand is a 58 y.o. male.  58 year old male with complaint of left side groin pain. Patient states he lifted something heavy and noted pain in his left groin with bulging concerning for hernia. No changes in bowel or bladder habits, no vomiting. Is wearing a hernia belt. No other complaints or concerns.        Home Medications Prior to Admission medications   Medication Sig Start Date End Date Taking? Authorizing Provider  amLODipine  (NORVASC ) 5 MG tablet Take 1 tablet (5 mg total) by mouth daily. 03/08/23   Jerrlyn Morel, NP  aspirin  325 MG tablet Take 1 tablet (325 mg total) by mouth daily. 03/08/23   Jerrlyn Morel, NP  atorvastatin  (LIPITOR ) 80 MG tablet Take 1 tablet (80 mg total) by mouth daily. 03/08/23   Jerrlyn Morel, NP  metoprolol  tartrate (LOPRESSOR ) 50 MG tablet Take 1 tablet (50 mg total) by mouth 2 (two) times daily. 03/08/23   Jerrlyn Morel, NP      Allergies    Patient has no known allergies.    Review of Systems   Review of Systems Negative except as per HPI Physical Exam Updated Vital Signs BP 138/88 (BP Location: Left Arm)   Pulse 77   Temp 98.4 F (36.9 C) (Oral)   Resp 18   SpO2 95%  Physical Exam Vitals and nursing note reviewed. Exam conducted with a chaperone present.  Constitutional:      General: He is not in acute distress.    Appearance: He is well-developed. He is not diaphoretic.  HENT:     Head: Normocephalic and atraumatic.  Pulmonary:     Effort: Pulmonary effort is normal.  Abdominal:     Palpations: Abdomen is soft.     Tenderness: There is no abdominal tenderness.     Hernia: A hernia is present. Hernia is present in the left inguinal area. There is no hernia in the right inguinal area.  Genitourinary:    Penis:  Uncircumcised.      Testes:        Right: Tenderness not present.        Left: Tenderness not present.     Comments: Left inguinal hernia present with standing. Patient placed in supine position with gentle pressure, hernia reduced without difficulty.  Skin:    General: Skin is warm and dry.     Findings: No erythema or rash.  Neurological:     Mental Status: He is alert and oriented to person, place, and time.  Psychiatric:        Behavior: Behavior normal.     ED Results / Procedures / Treatments   Labs (all labs ordered are listed, but only abnormal results are displayed) Labs Reviewed  COMPREHENSIVE METABOLIC PANEL WITH GFR - Abnormal; Notable for the following components:      Result Value   Glucose, Bld 101 (*)    BUN 28 (*)    Calcium  8.8 (*)    All other components within normal limits  CBC WITH DIFFERENTIAL/PLATELET    EKG None  Radiology No results found.  Procedures Hernia reduction  Date/Time: 08/06/2023 11:00 PM  Performed by: Darlis Eisenmenger, PA-C Authorized by: Darlis Eisenmenger, PA-C  Consent: Verbal consent  obtained. Risks and benefits: risks, benefits and alternatives were discussed Consent given by: patient Patient identity confirmed: verbally with patient Local anesthesia used: no  Anesthesia: Local anesthesia used: no  Sedation: Patient sedated: no  Patient tolerance: patient tolerated the procedure well with no immediate complications       Medications Ordered in ED Medications - No data to display  ED Course/ Medical Decision Making/ A&P                                 Medical Decision Making  This patient presents to the ED for concern of left groin pain, this involves an extensive number of treatment options, and is a complaint that carries with it a high risk of complications and morbidity.  The differential diagnosis includes hernia, testicular torsion, kidney stone    Co morbidities that complicate the patient  evaluation  HTN   Additional history obtained:  Additional history obtained from recent visit to PCP dated 03/08/23 External records from outside source obtained and reviewed including prior labs on file for comparison    Lab Tests:  I Ordered, and personally interpreted labs.  The pertinent results include:  CBC WNL. CMP without significant findings.    Problem List / ED Course / Critical interventions / Medication management  58 year old male with left groin pain after heavy lifting. History consistent with hernia identified on exam. No significant tenderness, abdomen soft and non tender. Hernia reduced, patient has a hernia belt with him. Referred to general surgery for follow up with return to ER precautions discussed with patient.  I have reviewed the patients home medicines and have made adjustments as needed   Social Determinants of Health:  Has PCP   Test / Admission - Considered:  Stable for dc with plan to follow up with general surgery, return to ER precautions          Final Clinical Impression(s) / ED Diagnoses Final diagnoses:  Unilateral inguinal hernia without obstruction or gangrene, recurrence not specified    Rx / DC Orders ED Discharge Orders     None         Darlis Eisenmenger, PA-C 08/06/23 2305    Jerilynn Montenegro, MD 08/08/23 1605

## 2023-08-08 ENCOUNTER — Telehealth: Payer: Self-pay

## 2023-08-08 NOTE — Transitions of Care (Post Inpatient/ED Visit) (Signed)
   08/08/2023  Name: Torie Priebe MRN: [id number] DOB: 01-Feb-1966  Today's TOC FU Call Status:   Patient's Name and Date of Birth confirmed.  Transition Care Management Follow-up Telephone Call Date of Discharge: 08/06/23 Discharge Facility: [facility]) Type of Discharge: Emergency Department How have you been since you were released from the hospital?: Better Any questions or concerns?: No  Items Reviewed: Did you receive and understand the discharge instructions provided?: Yes Medications obtained,verified, and reconciled?: Yes (Medications Reviewed) Any new allergies since your discharge?: No Dietary orders reviewed?: No Do you have support at home?: No  Medications Reviewed Today: Medications Reviewed Today     Reviewed by Angelita Bares, CMA (Certified Medical Assistant) on 08/08/23 at [phone]  Med List Status: <None>   Medication Order Taking? Sig Documenting Provider Last Dose Status Informant  amLODipine  (NORVASC ) 5 MG tablet [id number] Yes Take 1 tablet (5 mg total) by mouth daily. Jerrlyn Morel, NP Taking Active   aspirin  325 MG tablet [id number] Yes Take 1 tablet (325 mg total) by mouth daily. Jerrlyn Morel, NP Taking Active   atorvastatin  (LIPITOR ) 80 MG tablet [id number] Yes Take 1 tablet (80 mg total) by mouth daily. Jerrlyn Morel, NP Taking Active   metoprolol  tartrate (LOPRESSOR ) 50 MG tablet [id number] Yes Take 1 tablet (50 mg total) by mouth 2 (two) times daily. Jerrlyn Morel, NP Taking Active             Home Care and Equipment/Supplies: Were Home Health Services Ordered?: No Any new equipment or medical supplies ordered?: No  Functional Questionnaire: Do you need assistance with bathing/showering or dressing?: No Do you need assistance with meal preparation?: No Do you need assistance with eating?: No Do you have difficulty maintaining continence: No Do you need assistance with getting out of bed/getting out of a chair/moving?: No Do you  have difficulty managing or taking your medications?: No  Follow up appointments reviewed: PCP Follow-up appointment confirmed?: Yes Date of PCP follow-up appointment?: 08/17/23 Specialist Hospital Follow-up appointment confirmed?: Yes Date of Specialist follow-up appointment?: 08/11/23 Do you need transportation to your follow-up appointment?: No Do you understand care options if your condition(s) worsen?: Yes-patient verbalized understanding  SDOH Interventions Today    Flowsheet Row Most Recent Value  SDOH Interventions   Food Insecurity Interventions Intervention Not Indicated  Housing Interventions Intervention Not Indicated  Transportation Interventions Intervention Not Indicated       SIGNATURE Meryn Sarracino, RMA

## 2023-08-11 ENCOUNTER — Ambulatory Visit: Payer: Self-pay | Admitting: General Surgery

## 2023-08-17 ENCOUNTER — Inpatient Hospital Stay: Payer: Self-pay | Admitting: Nurse Practitioner

## 2023-09-06 ENCOUNTER — Ambulatory Visit: Payer: Self-pay | Admitting: Nurse Practitioner

## 2023-09-07 ENCOUNTER — Ambulatory Visit: Payer: Self-pay | Admitting: Nurse Practitioner

## 2023-09-08 ENCOUNTER — Ambulatory Visit (INDEPENDENT_AMBULATORY_CARE_PROVIDER_SITE_OTHER): Payer: Self-pay | Admitting: Nurse Practitioner

## 2023-09-08 ENCOUNTER — Encounter: Payer: Self-pay | Admitting: Nurse Practitioner

## 2023-09-08 ENCOUNTER — Other Ambulatory Visit (HOSPITAL_COMMUNITY): Payer: Self-pay

## 2023-09-08 VITALS — BP 139/76 | HR 89 | Temp 97.7°F | Wt 180.0 lb

## 2023-09-08 DIAGNOSIS — Z1211 Encounter for screening for malignant neoplasm of colon: Secondary | ICD-10-CM

## 2023-09-08 DIAGNOSIS — I10 Essential (primary) hypertension: Secondary | ICD-10-CM

## 2023-09-08 DIAGNOSIS — Z13220 Encounter for screening for lipoid disorders: Secondary | ICD-10-CM

## 2023-09-08 MED ORDER — AMLODIPINE BESYLATE 5 MG PO TABS
5.0000 mg | ORAL_TABLET | Freq: Every day | ORAL | 3 refills | Status: AC
  Filled 2023-09-08: qty 90, 90d supply, fill #0
  Filled 2024-03-15: qty 90, 90d supply, fill #1

## 2023-09-08 MED ORDER — ATORVASTATIN CALCIUM 80 MG PO TABS
80.0000 mg | ORAL_TABLET | Freq: Every day | ORAL | 3 refills | Status: AC
  Filled 2023-09-08: qty 90, 90d supply, fill #0
  Filled 2024-03-15: qty 90, 90d supply, fill #1

## 2023-09-08 MED ORDER — METOPROLOL TARTRATE 50 MG PO TABS
50.0000 mg | ORAL_TABLET | Freq: Two times a day (BID) | ORAL | 3 refills | Status: AC
  Filled 2023-09-08: qty 180, 90d supply, fill #0
  Filled 2024-03-15: qty 180, 90d supply, fill #1

## 2023-09-08 NOTE — Progress Notes (Signed)
 Subjective   Patient ID: Brad Tran, male    DOB: [birth year], 58 y.o.   MRN: [id number]  Chief Complaint  Patient presents with   Hospitalization Follow-up    Referring provider: Jerrlyn Morel, NP  Brad Tran is a 58 y.o. male with Past Medical History: No date: Hyperlipidemia No date: Hypertension No date: Impaired ambulation No date: Right sided weakness 2016: Stroke (HCC) 06/2020: Vitamin D  deficiency   HPI  Patient presents today for follow-up visit.  He was seen in the ED in April for hernia.  He does have his surgery scheduled for June 27.  Overall other than that he has been doing well.  He does need blood work today.  He is due for colon cancer screening we will order Cologuard today. Denies f/c/s, n/v/d, hemoptysis, PND, leg swelling Denies chest pain or edema     No Known Allergies  Immunization History  Administered Date(s) Administered   Influenza,inj,Quad PF,6+ Mos 03/17/2016, 03/05/2018, 03/07/2022   Tdap 03/17/2016    Tobacco History: Social History   Tobacco Use  Smoking Status Never  Smokeless Tobacco Never   Counseling given: Not Answered   Outpatient Encounter Medications as of 09/08/2023  Medication Sig   aspirin  325 MG tablet Take 1 tablet (325 mg total) by mouth daily.   [DISCONTINUED] amLODipine  (NORVASC ) 5 MG tablet Take 1 tablet (5 mg total) by mouth daily.   [DISCONTINUED] atorvastatin  (LIPITOR ) 80 MG tablet Take 1 tablet (80 mg total) by mouth daily.   [DISCONTINUED] metoprolol  tartrate (LOPRESSOR ) 50 MG tablet Take 1 tablet (50 mg total) by mouth 2 (two) times daily.   amLODipine  (NORVASC ) 5 MG tablet Take 1 tablet (5 mg total) by mouth daily.   atorvastatin  (LIPITOR ) 80 MG tablet Take 1 tablet (80 mg total) by mouth daily.   metoprolol  tartrate (LOPRESSOR ) 50 MG tablet Take 1 tablet (50 mg total) by mouth 2 (two) times daily.   No facility-administered encounter medications on file as of 09/08/2023.    Review of  Systems  Review of Systems  Constitutional: Negative.   HENT: Negative.    Cardiovascular: Negative.   Gastrointestinal: Negative.   Allergic/Immunologic: Negative.   Neurological: Negative.   Psychiatric/Behavioral: Negative.       Objective:   BP 139/76   Pulse 89   Temp 97.7 F (36.5 C) (Oral)   Wt 180 lb (81.6 kg)   SpO2 97%   BMI 29.95 kg/m   Wt Readings from Last 5 Encounters:  09/08/23 180 lb (81.6 kg)  03/08/23 176 lb (79.8 kg)  09/05/22 171 lb 6.4 oz (77.7 kg)  03/07/22 172 lb (78 kg)  09/03/21 168 lb 12.8 oz (76.6 kg)     Physical Exam Vitals and nursing note reviewed.  Constitutional:      General: He is not in acute distress.    Appearance: He is well-developed.  Cardiovascular:     Rate and Rhythm: Normal rate and regular rhythm.  Pulmonary:     Effort: Pulmonary effort is normal.     Breath sounds: Normal breath sounds.  Skin:    General: Skin is warm and dry.  Neurological:     Mental Status: He is alert and oriented to person, place, and time.       Assessment & Plan:   Screen for colon cancer -     Cologuard  Hypertension, unspecified type -     CBC -     Comprehensive metabolic panel with GFR -  Lipid panel -     Hemoglobin A1c -     amLODIPine  Besylate; Take 1 tablet (5 mg total) by mouth daily.  Dispense: 90 tablet; Refill: 3 -     Atorvastatin  Calcium ; Take 1 tablet (80 mg total) by mouth daily.  Dispense: 90 tablet; Refill: 3 -     Metoprolol  Tartrate; Take 1 tablet (50 mg total) by mouth 2 (two) times daily.  Dispense: 180 tablet; Refill: 3  Lipid screening -     Lipid panel     Return if symptoms worsen or fail to improve.   Jerrlyn Morel, NP 09/08/2023

## 2023-09-08 NOTE — Patient Instructions (Signed)
 1. Screen for colon cancer (Primary)  - Cologuard  2. Hypertension, unspecified type  - CBC - Comprehensive metabolic panel with GFR - Lipid Panel - Hemoglobin A1c - amLODipine  (NORVASC ) 5 MG tablet; Take 1 tablet (5 mg total) by mouth daily.  Dispense: 90 tablet; Refill: 3 - atorvastatin  (LIPITOR ) 80 MG tablet; Take 1 tablet (80 mg total) by mouth daily.  Dispense: 90 tablet; Refill: 3 - metoprolol  tartrate (LOPRESSOR ) 50 MG tablet; Take 1 tablet (50 mg total) by mouth 2 (two) times daily.  Dispense: 180 tablet; Refill: 3  3. Lipid screening  - Lipid Panel

## 2023-09-09 LAB — CBC
Hematocrit: 42.3 % (ref 37.5–51.0)
Hemoglobin: 13.9 g/dL (ref 13.0–17.7)
MCH: 29.6 pg (ref 26.6–33.0)
MCHC: 32.9 g/dL (ref 31.5–35.7)
MCV: 90 fL (ref 79–97)
Platelets: 202 10*3/uL (ref 150–450)
RBC: 4.69 x10E6/uL (ref 4.14–5.80)
RDW: 13.3 % (ref 11.6–15.4)
WBC: 4 10*3/uL (ref 3.4–10.8)

## 2023-09-09 LAB — COMPREHENSIVE METABOLIC PANEL WITH GFR
ALT: 48 IU/L — ABNORMAL HIGH (ref 0–44)
AST: 24 IU/L (ref 0–40)
Albumin: 4.2 g/dL (ref 3.8–4.9)
Alkaline Phosphatase: 79 IU/L (ref 44–121)
BUN/Creatinine Ratio: 16 (ref 9–20)
BUN: 18 mg/dL (ref 6–24)
Bilirubin Total: 0.4 mg/dL (ref 0.0–1.2)
CO2: 18 mmol/L — ABNORMAL LOW (ref 20–29)
Calcium: 8.9 mg/dL (ref 8.7–10.2)
Chloride: 107 mmol/L — ABNORMAL HIGH (ref 96–106)
Creatinine, Ser: 1.12 mg/dL (ref 0.76–1.27)
Globulin, Total: 2.1 g/dL (ref 1.5–4.5)
Glucose: 142 mg/dL — ABNORMAL HIGH (ref 70–99)
Potassium: 3.5 mmol/L (ref 3.5–5.2)
Sodium: 142 mmol/L (ref 134–144)
Total Protein: 6.3 g/dL (ref 6.0–8.5)
eGFR: 76 mL/min/{1.73_m2} (ref >59)

## 2023-09-09 LAB — LIPID PANEL
Chol/HDL Ratio: 5.1 ratio — ABNORMAL HIGH (ref 0.0–5.0)
Cholesterol, Total: 199 mg/dL (ref 100–199)
HDL: 39 mg/dL — ABNORMAL LOW (ref >39)
LDL Chol Calc (NIH): 106 mg/dL — ABNORMAL HIGH (ref 0–99)
Triglycerides: 314 mg/dL — ABNORMAL HIGH (ref 0–149)
VLDL Cholesterol Cal: 54 mg/dL — ABNORMAL HIGH (ref 5–40)

## 2023-09-09 LAB — HEMOGLOBIN A1C
Est. average glucose Bld gHb Est-mCnc: 126 mg/dL
Hgb A1c MFr Bld: 6 % — ABNORMAL HIGH (ref 4.8–5.6)

## 2023-09-12 ENCOUNTER — Ambulatory Visit: Payer: Self-pay | Admitting: Nurse Practitioner

## 2023-09-29 NOTE — Patient Instructions (Signed)
 SURGICAL WAITING ROOM VISITATION  Patients having surgery or a procedure may have no more than 2 support people in the waiting area - these visitors may rotate.    Children under the age of 83 must have an adult with them who is not the patient.  Visitors with respiratory illnesses are discouraged from visiting and should remain at home.  If the patient needs to stay at the hospital during part of their recovery, the visitor guidelines for inpatient rooms apply. Pre-op nurse will coordinate an appropriate time for 1 support person to accompany patient in pre-op.  This support person may not rotate.    Please refer to the [facility] website for the visitor guidelines for Inpatients (after your surgery is over and you are in a regular room).       Your procedure is scheduled on: 10/13/23   Report to [facility] Main Entrance    Report to admitting at : 5:15 AM   Call this number if you have problems the morning of surgery [phone]   Eat a light diet the day before surgery.  Examples including soups, broths, toast, yogurt, mashed potatoes.  Things to avoid include carbonated beverages (fizzy beverages), raw fruits and raw vegetables, or beans.   If your bowels are filled with gas, your surgeon will have difficulty visualizing your pelvic organs which increases your surgical risks.  Do not eat food or drink fluid :After Midnight.  FOLLOW ANY ADDITIONAL PRE OP INSTRUCTIONS YOU RECEIVED FROM YOUR SURGEON'S OFFICE!!!   Oral Hygiene is also important to reduce your risk of infection.                                    Remember - BRUSH YOUR TEETH THE MORNING OF SURGERY WITH YOUR REGULAR TOOTHPASTE  DENTURES WILL BE REMOVED PRIOR TO SURGERY PLEASE DO NOT APPLY Poly grip OR ADHESIVES!!!   Do NOT smoke after Midnight   Stop all vitamins and herbal supplements 7 days before surgery.   Take these medicines the morning of surgery with A SIP OF WATER: metoprolol ,amlodipine .                    You may not have any metal on your body including hair pins, jewelry, and body piercing             Do not wear lotions, powders, perfumes/cologne, or deodorant              Men may shave face and neck.   Do not bring valuables to the hospital. [facility] IS NOT             RESPONSIBLE   FOR VALUABLES.   Contacts, glasses, dentures or bridgework may not be worn into surgery.   Bring small overnight bag day of surgery.   DO NOT BRING YOUR HOME MEDICATIONS TO THE HOSPITAL. PHARMACY WILL DISPENSE MEDICATIONS LISTED ON YOUR MEDICATION LIST TO YOU DURING YOUR ADMISSION IN THE HOSPITAL!    Patients discharged on the day of surgery will not be allowed to drive home.  Someone NEEDS to stay with you for the first 24 hours after anesthesia.   Special Instructions: Bring a copy of your healthcare power of attorney and living will documents the day of surgery if you haven't scanned them before.              Please read over the  following fact sheets you were given: IF YOU HAVE QUESTIONS ABOUT YOUR PRE-OP INSTRUCTIONS PLEASE CALL [phone]   If you received a COVID test during your pre-op visit  it is requested that you wear a mask when out in public, stay away from anyone that may not be feeling well and notify your surgeon if you develop symptoms. If you test positive for Covid or have been in contact with anyone that has tested positive in the last 10 days please notify you surgeon.    [facility] - Preparing for Surgery Before surgery, you can play an important role.  Because skin is not sterile, your skin needs to be as free of germs as possible.  You can reduce the number of germs on your skin by washing with CHG (chlorahexidine gluconate) soap before surgery.  CHG is an antiseptic cleaner which kills germs and bonds with the skin to continue killing germs even after washing. Please DO NOT use if you have an allergy to CHG or antibacterial soaps.  If your skin becomes  reddened/irritated stop using the CHG and inform your nurse when you arrive at Short Stay. Do not shave (including legs and underarms) for at least 48 hours prior to the first CHG shower.  You may shave your face/neck. Please follow these instructions carefully:  1.  Shower with CHG Soap the night before surgery and the  morning of Surgery.  2.  If you choose to wash your hair, wash your hair first as usual with your  normal  shampoo.  3.  After you shampoo, rinse your hair and body thoroughly to remove the  shampoo.                           4.  Use CHG as you would any other liquid soap.  You can apply chg directly  to the skin and wash                       Gently with a scrungie or clean washcloth.  5.  Apply the CHG Soap to your body ONLY FROM THE NECK DOWN.   Do not use on face/ open                           Wound or open sores. Avoid contact with eyes, ears mouth and genitals (private parts).                       Wash face,  Genitals (private parts) with your normal soap.             6.  Wash thoroughly, paying special attention to the area where your surgery  will be performed.  7.  Thoroughly rinse your body with warm water from the neck down.  8.  DO NOT shower/wash with your normal soap after using and rinsing off  the CHG Soap.                9.  Pat yourself dry with a clean towel.            10.  Wear clean pajamas.            11.  Place clean sheets on your bed the night of your first shower and do not  sleep with pets. Day of Surgery : Do not apply any lotions/deodorants the morning of  surgery.  Please wear clean clothes to the hospital/surgery center.  FAILURE TO FOLLOW THESE INSTRUCTIONS MAY RESULT IN THE CANCELLATION OF YOUR SURGERY PATIENT SIGNATURE_________________________________  NURSE SIGNATURE__________________________________  ________________________________________________________________________

## 2023-10-02 ENCOUNTER — Other Ambulatory Visit: Payer: Self-pay

## 2023-10-02 ENCOUNTER — Encounter (HOSPITAL_COMMUNITY)
Admission: RE | Admit: 2023-10-02 | Discharge: 2023-10-02 | Disposition: A | Discharge status: Home / Self Care | Payer: Self-pay | Source: Ambulatory Visit | Attending: General Surgery | Admitting: General Surgery

## 2023-10-02 ENCOUNTER — Encounter (HOSPITAL_COMMUNITY): Payer: Self-pay

## 2023-10-02 VITALS — BP 151/96 | HR 60 | Temp 98.3°F | Ht 63.0 in | Wt 177.0 lb

## 2023-10-02 DIAGNOSIS — Z01818 Encounter for other preprocedural examination: Secondary | ICD-10-CM | POA: Insufficient documentation

## 2023-10-02 DIAGNOSIS — I10 Essential (primary) hypertension: Secondary | ICD-10-CM | POA: Insufficient documentation

## 2023-10-02 DIAGNOSIS — R9431 Abnormal electrocardiogram [ECG] [EKG]: Secondary | ICD-10-CM | POA: Insufficient documentation

## 2023-10-02 HISTORY — DX: Prediabetes: R73.03

## 2023-10-02 LAB — BASIC METABOLIC PANEL WITH GFR
Anion gap: 10 (ref 5–15)
BUN: 17 mg/dL (ref 6–20)
CO2: 22 mmol/L (ref 22–32)
Calcium: 8.9 mg/dL (ref 8.9–10.3)
Chloride: 108 mmol/L (ref 98–111)
Creatinine, Ser: 0.94 mg/dL (ref 0.61–1.24)
GFR, Estimated: >60 mL/min (ref >60)
Glucose, Bld: 97 mg/dL (ref 70–99)
Potassium: 3.6 mmol/L (ref 3.5–5.1)
Sodium: 140 mmol/L (ref 135–145)

## 2023-10-02 LAB — CBC
HCT: 42.6 % (ref 39.0–52.0)
Hemoglobin: 14.2 g/dL (ref 13.0–17.0)
MCH: 30 pg (ref 26.0–34.0)
MCHC: 33.3 g/dL (ref 30.0–36.0)
MCV: 89.9 fL (ref 80.0–100.0)
Platelets: 198 10*3/uL (ref 150–400)
RBC: 4.74 MIL/uL (ref 4.22–5.81)
RDW: 12.6 % (ref 11.5–15.5)
WBC: 3.9 10*3/uL — ABNORMAL LOW (ref 4.0–10.5)
nRBC: 0 % (ref 0.0–0.2)

## 2023-10-02 NOTE — Progress Notes (Addendum)
 For Anesthesia: PCP - Jerrlyn Morel, NP. LOV: 09/08/23  Cardiologist - N/A  Bowel Prep reminder:N/A  Chest x-ray -  EKG - 10/02/23 Stress Test -  ECHO - 10/26/15 Cardiac Cath -  Pacemaker/ICD device last checked: Pacemaker orders received: Device Rep notified:  Spinal Cord Stimulator:N/A  Sleep Study - N/A CPAP -   Fasting Blood Sugar - N/A Checks Blood Sugar _____ times a day Date and result of last Hgb A1c-  Last dose of GLP1 agonist- N/A GLP1 instructions:   Last dose of SGLT-2 inhibitors- N/A SGLT-2 instructions:   Blood Thinner Instructions: Aspirin  Instructions:Pt. Was advised to call MD. About aspirin  325 mg. Instructions. Last Dose:  Activity level: Can go up a flight of stairs and activities of daily living without stopping and without chest pain and/or shortness of breath   Able to exercise without chest pain and/or shortness of breath  Anesthesia review: Hx: HTN,Stroke,Pre-DIA.  Patient denies shortness of breath, fever, cough and chest pain at PAT appointment   Patient verbalized understanding of instructions that were reviewed over the telephone.

## 2023-10-12 NOTE — Anesthesia Preprocedure Evaluation (Addendum)
 Anesthesia Evaluation  Patient identified by MRN, date of birth, ID band Patient awake    Reviewed: Allergy & Precautions, NPO status , Patient's Chart, lab work & pertinent test results  History of Anesthesia Complications Negative for: history of anesthetic complications  Airway Mallampati: III  TM Distance: >3 FB Neck ROM: Full    Dental  (+) Dental Advisory Given   Pulmonary neg shortness of breath, neg sleep apnea, neg COPD, neg recent URI, former smoker   Pulmonary exam normal breath sounds clear to auscultation       Cardiovascular hypertension (amlodipine , metoprolol ), Pt. on medications and Pt. on home beta blockers (-) angina (-) Past MI, (-) Cardiac Stents and (-) CABG (-) dysrhythmias  Rhythm:Regular Rate:Normal  HLD  TTE 10/26/2015: Study Conclusions   - Left ventricle: The cavity size was normal. There was mild    concentric hypertrophy. Systolic function was normal. The    estimated ejection fraction was in the range of 60% to 65%. Wall    motion was normal; there were no regional wall motion    abnormalities. Doppler parameters are consistent with abnormal    left ventricular relaxation (grade 1 diastolic dysfunction).  - Mitral valve: Calcified annulus     Neuro/Psych neg Seizures CVA (2016, right-sided weakness), Residual Symptoms    GI/Hepatic negative GI ROS, Neg liver ROS,,,  Endo/Other  Pre-diabetes  Renal/GU negative Renal ROS     Musculoskeletal   Abdominal  (+) + obese  Peds  Hematology negative hematology ROS (+) Lab Results      Component                Value               Date                      WBC                      3.9 (L)             10/02/2023                HGB                      14.2                10/02/2023                HCT                      42.6                10/02/2023                MCV                      89.9                10/02/2023                PLT                       198                 10/02/2023              Anesthesia Other Findings   Reproductive/Obstetrics  Anesthesia Physical Anesthesia Plan  ASA: 3  Anesthesia Plan: General   Post-op Pain Management: Regional block* and Tylenol PO (pre-op)*   Induction: Intravenous  PONV Risk Score and Plan: 2 and Ondansetron , Dexamethasone and Treatment may vary due to age or medical condition  Airway Management Planned: Oral ETT  Additional Equipment:   Intra-op Plan:   Post-operative Plan: Extubation in OR  Informed Consent: I have reviewed the patients History and Physical, chart, labs and discussed the procedure including the risks, benefits and alternatives for the proposed anesthesia with the patient or authorized representative who has indicated his/her understanding and acceptance.     Dental advisory given  Plan Discussed with: CRNA and Anesthesiologist  Anesthesia Plan Comments: (Risks of general anesthesia discussed including, but not limited to, sore throat, hoarse voice, chipped/damaged teeth, injury to vocal cords, nausea and vomiting, allergic reactions, lung infection, heart attack, stroke, and death. All questions answered. )        Anesthesia Quick Evaluation

## 2023-10-13 ENCOUNTER — Other Ambulatory Visit (HOSPITAL_COMMUNITY): Payer: Self-pay

## 2023-10-13 ENCOUNTER — Other Ambulatory Visit: Payer: Self-pay

## 2023-10-13 ENCOUNTER — Ambulatory Visit (HOSPITAL_COMMUNITY): Payer: Self-pay | Admitting: Anesthesiology

## 2023-10-13 ENCOUNTER — Encounter (HOSPITAL_COMMUNITY)
Admission: RE | Disposition: A | Discharge status: Home / Self Care | Payer: Self-pay | Source: Ambulatory Visit | Attending: General Surgery

## 2023-10-13 ENCOUNTER — Ambulatory Visit (HOSPITAL_COMMUNITY)
Admission: RE | Admit: 2023-10-13 | Discharge: 2023-10-13 | Disposition: A | Discharge status: Home / Self Care | Payer: Self-pay | Source: Ambulatory Visit | Attending: General Surgery | Admitting: General Surgery

## 2023-10-13 ENCOUNTER — Encounter (HOSPITAL_COMMUNITY): Payer: Self-pay | Admitting: General Surgery

## 2023-10-13 DIAGNOSIS — I679 Cerebrovascular disease, unspecified: Secondary | ICD-10-CM

## 2023-10-13 DIAGNOSIS — Z87891 Personal history of nicotine dependence: Secondary | ICD-10-CM | POA: Insufficient documentation

## 2023-10-13 DIAGNOSIS — R7303 Prediabetes: Secondary | ICD-10-CM | POA: Insufficient documentation

## 2023-10-13 DIAGNOSIS — I69351 Hemiplegia and hemiparesis following cerebral infarction affecting right dominant side: Secondary | ICD-10-CM | POA: Insufficient documentation

## 2023-10-13 DIAGNOSIS — Z79899 Other long term (current) drug therapy: Secondary | ICD-10-CM | POA: Insufficient documentation

## 2023-10-13 DIAGNOSIS — K4090 Unilateral inguinal hernia, without obstruction or gangrene, not specified as recurrent: Secondary | ICD-10-CM | POA: Insufficient documentation

## 2023-10-13 DIAGNOSIS — I10 Essential (primary) hypertension: Secondary | ICD-10-CM

## 2023-10-13 DIAGNOSIS — E785 Hyperlipidemia, unspecified: Secondary | ICD-10-CM | POA: Insufficient documentation

## 2023-10-13 HISTORY — PX: XI ROBOTIC ASSISTED INGUINAL HERNIA REPAIR WITH MESH: SHX6706

## 2023-10-13 SURGERY — REPAIR, HERNIA, INGUINAL, ROBOT-ASSISTED, LAPAROSCOPIC, USING MESH
Anesthesia: General | Laterality: Left

## 2023-10-13 MED ORDER — LACTATED RINGERS IV SOLN: INTRAVENOUS | Status: DC

## 2023-10-13 MED ORDER — PHENYLEPHRINE 80 MCG/ML (10ML) SYRINGE FOR IV PUSH (FOR BLOOD PRESSURE SUPPORT)
PREFILLED_SYRINGE | INTRAVENOUS | Status: DC | PRN
Start: 2023-10-13 — End: 2023-10-13
  Administered 2023-10-13 (×3): 80 ug via INTRAVENOUS

## 2023-10-13 MED ORDER — BUPIVACAINE LIPOSOME 1.3 % IJ SUSP
INTRAMUSCULAR | Status: AC
  Filled 2023-10-13: qty 20

## 2023-10-13 MED ORDER — PHENYLEPHRINE HCL (PRESSORS) 10 MG/ML IV SOLN
INTRAVENOUS | Status: AC
  Filled 2023-10-13: qty 1

## 2023-10-13 MED ORDER — DEXAMETHASONE SODIUM PHOSPHATE 10 MG/ML IJ SOLN
INTRAMUSCULAR | Status: DC | PRN
  Administered 2023-10-13: 8 mg via INTRAVENOUS

## 2023-10-13 MED ORDER — FENTANYL CITRATE (PF) 100 MCG/2ML IJ SOLN
INTRAMUSCULAR | Status: AC
  Filled 2023-10-13: qty 2

## 2023-10-13 MED ORDER — CHLORHEXIDINE GLUCONATE CLOTH 2 % EX PADS: 6.0000 | MEDICATED_PAD | Freq: Once | CUTANEOUS | Status: DC

## 2023-10-13 MED ORDER — LIDOCAINE HCL (PF) 2 % IJ SOLN
INTRAMUSCULAR | Status: DC | PRN
  Administered 2023-10-13: 100 mg via INTRADERMAL

## 2023-10-13 MED ORDER — BUPIVACAINE LIPOSOME 1.3 % IJ SUSP
INTRAMUSCULAR | Status: DC | PRN
  Administered 2023-10-13 (×2): 10 mL via PERINEURAL

## 2023-10-13 MED ORDER — PHENYLEPHRINE HCL (PRESSORS) 10 MG/ML IV SOLN
INTRAVENOUS | Status: AC
Start: 2023-10-13 — End: 2023-10-13
  Filled 2023-10-13: qty 1

## 2023-10-13 MED ORDER — BUPIVACAINE-EPINEPHRINE (PF) 0.25% -1:200000 IJ SOLN
INTRAMUSCULAR | Status: AC
  Filled 2023-10-13: qty 30

## 2023-10-13 MED ORDER — PHENYLEPHRINE HCL-NACL 20-0.9 MG/250ML-% IV SOLN
INTRAVENOUS | Status: DC | PRN
  Administered 2023-10-13: 40 ug/min via INTRAVENOUS

## 2023-10-13 MED ORDER — FENTANYL CITRATE (PF) 100 MCG/2ML IJ SOLN
INTRAMUSCULAR | Status: AC
Start: 2023-10-13 — End: 2023-10-13
  Filled 2023-10-13: qty 2

## 2023-10-13 MED ORDER — AMISULPRIDE (ANTIEMETIC) 5 MG/2ML IV SOLN: 10.0000 mg | Freq: Once | INTRAVENOUS | Status: DC | PRN

## 2023-10-13 MED ORDER — BUPIVACAINE HCL (PF) 0.25 % IJ SOLN
INTRAMUSCULAR | Status: DC | PRN
Start: 2023-10-13 — End: 2023-10-13
  Administered 2023-10-13 (×2): 20 mL via PERINEURAL

## 2023-10-13 MED ORDER — OXYCODONE HCL 5 MG/5ML PO SOLN: 5.0000 mg | Freq: Once | ORAL | Status: DC | PRN

## 2023-10-13 MED ORDER — PROPOFOL 10 MG/ML IV BOLUS
INTRAVENOUS | Status: DC | PRN
Start: 2023-10-13 — End: 2023-10-13
  Administered 2023-10-13: 150 mg via INTRAVENOUS
  Administered 2023-10-13: 50 mg via INTRAVENOUS

## 2023-10-13 MED ORDER — ROCURONIUM BROMIDE 10 MG/ML (PF) SYRINGE
PREFILLED_SYRINGE | INTRAVENOUS | Status: AC
  Filled 2023-10-13: qty 10

## 2023-10-13 MED ORDER — CEFAZOLIN SODIUM-DEXTROSE 2-4 GM/100ML-% IV SOLN
2.0000 g | INTRAVENOUS | Status: AC
  Administered 2023-10-13: 2 g via INTRAVENOUS
  Filled 2023-10-13: qty 100

## 2023-10-13 MED ORDER — ONDANSETRON HCL 4 MG/2ML IJ SOLN
INTRAMUSCULAR | Status: AC
  Filled 2023-10-13: qty 2

## 2023-10-13 MED ORDER — GABAPENTIN 300 MG PO CAPS
300.0000 mg | ORAL_CAPSULE | ORAL | Status: AC
  Administered 2023-10-13: 300 mg via ORAL
  Filled 2023-10-13: qty 1

## 2023-10-13 MED ORDER — FENTANYL CITRATE (PF) 100 MCG/2ML IJ SOLN
INTRAMUSCULAR | Status: DC | PRN
  Administered 2023-10-13 (×3): 50 ug via INTRAVENOUS

## 2023-10-13 MED ORDER — CHLORHEXIDINE GLUCONATE 0.12 % MT SOLN
15.0000 mL | Freq: Once | OROMUCOSAL | Status: AC
  Administered 2023-10-13: 15 mL via OROMUCOSAL

## 2023-10-13 MED ORDER — ALBUMIN HUMAN 5 % IV SOLN: INTRAVENOUS | Status: DC | PRN

## 2023-10-13 MED ORDER — DEXMEDETOMIDINE HCL IN NACL 80 MCG/20ML IV SOLN
INTRAVENOUS | Status: DC | PRN
  Administered 2023-10-13 (×3): 4 ug via INTRAVENOUS

## 2023-10-13 MED ORDER — OXYCODONE HCL 5 MG PO TABS
5.0000 mg | ORAL_TABLET | Freq: Three times a day (TID) | ORAL | 0 refills | Status: AC | PRN
  Filled 2023-10-13: qty 12, 4d supply, fill #0

## 2023-10-13 MED ORDER — MIDAZOLAM HCL 2 MG/2ML IJ SOLN
INTRAMUSCULAR | Status: DC | PRN
  Administered 2023-10-13: 2 mg via INTRAVENOUS

## 2023-10-13 MED ORDER — SODIUM CHLORIDE 0.9 % IR SOLN
Status: DC | PRN
  Administered 2023-10-13: 1000 mL

## 2023-10-13 MED ORDER — MIDAZOLAM HCL 2 MG/2ML IJ SOLN
INTRAMUSCULAR | Status: AC
Start: 2023-10-13 — End: 2023-10-13
  Filled 2023-10-13: qty 2

## 2023-10-13 MED ORDER — FENTANYL CITRATE PF 50 MCG/ML IJ SOSY: 25.0000 ug | PREFILLED_SYRINGE | INTRAMUSCULAR | Status: DC | PRN

## 2023-10-13 MED ORDER — ACETAMINOPHEN 500 MG PO TABS
1000.0000 mg | ORAL_TABLET | ORAL | Status: AC
  Administered 2023-10-13: 1000 mg via ORAL
  Filled 2023-10-13: qty 2

## 2023-10-13 MED ORDER — OXYCODONE HCL 5 MG PO TABS: 5.0000 mg | ORAL_TABLET | Freq: Once | ORAL | Status: DC | PRN

## 2023-10-13 MED ORDER — PHENYLEPHRINE 80 MCG/ML (10ML) SYRINGE FOR IV PUSH (FOR BLOOD PRESSURE SUPPORT)
PREFILLED_SYRINGE | INTRAVENOUS | Status: AC
Start: 2023-10-13 — End: 2023-10-13
  Filled 2023-10-13: qty 10

## 2023-10-13 MED ORDER — BUPIVACAINE LIPOSOME 1.3 % IJ SUSP: 20.0000 mL | Freq: Once | INTRAMUSCULAR | Status: DC

## 2023-10-13 MED ORDER — CELECOXIB 200 MG PO CAPS
200.0000 mg | ORAL_CAPSULE | ORAL | Status: AC
  Administered 2023-10-13: 200 mg via ORAL
  Filled 2023-10-13: qty 1

## 2023-10-13 MED ORDER — PROPOFOL 10 MG/ML IV BOLUS
INTRAVENOUS | Status: AC
  Filled 2023-10-13: qty 20

## 2023-10-13 MED ORDER — ORAL CARE MOUTH RINSE: 15.0000 mL | Freq: Once | OROMUCOSAL | Status: AC

## 2023-10-13 MED ORDER — BUPIVACAINE-EPINEPHRINE 0.25% -1:200000 IJ SOLN
INTRAMUSCULAR | Status: DC | PRN
  Administered 2023-10-13: 12 mL

## 2023-10-13 MED ORDER — ACETAMINOPHEN 325 MG PO TABS
650.0000 mg | ORAL_TABLET | Freq: Four times a day (QID) | ORAL | 0 refills | Status: AC
  Filled 2023-10-13: qty 50, 7d supply, fill #0

## 2023-10-13 MED ORDER — ONDANSETRON HCL 4 MG/2ML IJ SOLN
INTRAMUSCULAR | Status: DC | PRN
  Administered 2023-10-13: 4 mg via INTRAVENOUS

## 2023-10-13 MED ORDER — LACTATED RINGERS IV SOLN: INTRAVENOUS | Status: DC | PRN

## 2023-10-13 MED ORDER — ROCURONIUM BROMIDE 10 MG/ML (PF) SYRINGE
PREFILLED_SYRINGE | INTRAVENOUS | Status: DC | PRN
Start: 2023-10-13 — End: 2023-10-13
  Administered 2023-10-13: 20 mg via INTRAVENOUS
  Administered 2023-10-13: 60 mg via INTRAVENOUS

## 2023-10-13 MED ORDER — DEXAMETHASONE SODIUM PHOSPHATE 10 MG/ML IJ SOLN
INTRAMUSCULAR | Status: AC
  Filled 2023-10-13: qty 1

## 2023-10-13 MED ORDER — LIDOCAINE HCL (PF) 2 % IJ SOLN
INTRAMUSCULAR | Status: AC
Start: 2023-10-13 — End: 2023-10-13
  Filled 2023-10-13: qty 5

## 2023-10-13 MED ORDER — IBUPROFEN 200 MG PO TABS
600.0000 mg | ORAL_TABLET | Freq: Four times a day (QID) | ORAL | 0 refills | Status: AC
  Filled 2023-10-13 (×2): qty 75, 7d supply, fill #0
  Filled 2023-10-13: qty 60, 5d supply, fill #0

## 2023-10-13 MED ORDER — SUGAMMADEX SODIUM 200 MG/2ML IV SOLN
INTRAVENOUS | Status: DC | PRN
  Administered 2023-10-13: 200 mg via INTRAVENOUS

## 2023-10-13 SURGICAL SUPPLY — 45 items
APPLICATOR COTTON TIP 6 STRL (MISCELLANEOUS) IMPLANT
BAG COUNTER SPONGE SURGICOUNT (BAG) IMPLANT
BLADE SURG SZ11 CARB STEEL (BLADE) ×1 IMPLANT
CHLORAPREP W/TINT 26 (MISCELLANEOUS) ×1 IMPLANT
COVER MAYO STAND STRL (DRAPES) ×1 IMPLANT
COVER SURGICAL LIGHT HANDLE (MISCELLANEOUS) ×1 IMPLANT
COVER TIP SHEARS 8 DVNC (MISCELLANEOUS) ×1 IMPLANT
DERMABOND ADVANCED .7 DNX12 (GAUZE/BANDAGES/DRESSINGS) ×1 IMPLANT
DRAPE ARM DVNC X/XI (DISPOSABLE) ×3 IMPLANT
DRAPE COLUMN DVNC XI (DISPOSABLE) ×1 IMPLANT
DRIVER NDL MEGA SUTCUT DVNCXI (INSTRUMENTS) ×1 IMPLANT
DRIVER NDLE MEGA SUTCUT DVNCXI (INSTRUMENTS) ×1 IMPLANT
ELECT REM PT RETURN 15FT ADLT (MISCELLANEOUS) ×1 IMPLANT
FORCEPS BPLR 8 MD DVNC XI (FORCEP) ×1 IMPLANT
GAUZE 4X4 16PLY ~~LOC~~+RFID DBL (SPONGE) ×1 IMPLANT
GLOVE BIO SURGEON STRL SZ7 (GLOVE) ×2 IMPLANT
GOWN STRL REUS W/ TWL XL LVL3 (GOWN DISPOSABLE) ×2 IMPLANT
IRRIGATION SUCT STRKRFLW 2 WTP (MISCELLANEOUS) IMPLANT
KIT BASIN OR (CUSTOM PROCEDURE TRAY) ×1 IMPLANT
KIT TURNOVER KIT A (KITS) ×1 IMPLANT
MARKER SKIN DUAL TIP RULER LAB (MISCELLANEOUS) ×1 IMPLANT
MESH 3DMAX MID 5X7 LT XLRG (Mesh General) IMPLANT
NDL HYPO 22X1.5 SAFETY MO (MISCELLANEOUS) ×1 IMPLANT
NDL INSUFFLATION 14GA 120MM (NEEDLE) ×1 IMPLANT
NEEDLE HYPO 22X1.5 SAFETY MO (MISCELLANEOUS) ×1 IMPLANT
NEEDLE INSUFFLATION 14GA 120MM (NEEDLE) ×1 IMPLANT
OBTURATOR OPTICALSTD 8 DVNC (TROCAR) ×1 IMPLANT
PACK CARDIOVASCULAR III (CUSTOM PROCEDURE TRAY) ×1 IMPLANT
SCISSORS MNPLR CVD DVNC XI (INSTRUMENTS) ×1 IMPLANT
SEAL UNIV 5-12 XI (MISCELLANEOUS) ×3 IMPLANT
SOLUTION ANTFG W/FOAM PAD STRL (MISCELLANEOUS) ×1 IMPLANT
SOLUTION ELECTROSURG ANTI STCK (MISCELLANEOUS) ×1 IMPLANT
SPIKE FLUID TRANSFER (MISCELLANEOUS) ×1 IMPLANT
SUT MNCRL AB 4-0 PS2 18 (SUTURE) ×1 IMPLANT
SUT STRATA PDS 2-0 23 CT-1 (SUTURE) IMPLANT
SUT STRATAFIX SPIRAL PDS3-0 (SUTURE) ×1 IMPLANT
SUT VIC AB 2-0 SH 27X BRD (SUTURE) IMPLANT
SUT VIC AB 3-0 SH 27X BRD (SUTURE) ×1 IMPLANT
SYR 10ML LL (SYRINGE) ×1 IMPLANT
SYR 20ML LL LF (SYRINGE) ×1 IMPLANT
TAPE STRIPS DRAPE STRL (GAUZE/BANDAGES/DRESSINGS) ×1 IMPLANT
TOWEL GREEN STERILE FF (TOWEL DISPOSABLE) ×1 IMPLANT
TOWEL OR 17X26 10 PK STRL BLUE (TOWEL DISPOSABLE) ×1 IMPLANT
TROCAR Z-THREAD OPTICAL 5X100M (TROCAR) IMPLANT
TUBING INSUFFLATION 10FT LAP (TUBING) ×1 IMPLANT

## 2023-10-13 NOTE — Discharge Instructions (Signed)

## 2023-10-13 NOTE — Anesthesia Postprocedure Evaluation (Signed)
 Anesthesia Post Note  Patient: Brad Tran  Procedure(s) Performed: REPAIR, HERNIA, INGUINAL, ROBOT-ASSISTED, LAPAROSCOPIC, USING MESH (Left)     Patient location during evaluation: PACU Anesthesia Type: General Level of consciousness: awake Pain management: pain level controlled Vital Signs Assessment: post-procedure vital signs reviewed and stable Respiratory status: spontaneous breathing, nonlabored ventilation and respiratory function stable Cardiovascular status: blood pressure returned to baseline and stable Postop Assessment: no apparent nausea or vomiting Anesthetic complications: no   No notable events documented.  Last Vitals:  Vitals:   10/13/23 1045 10/13/23 1100  BP: (!) 142/91 (!) 146/89  Pulse: 70 66  Resp:    Temp:    SpO2: 96% 98%    Last Pain:  Vitals:   10/13/23 1041  TempSrc: Oral  PainSc: 0-No pain                 Delon Aisha Arch

## 2023-10-13 NOTE — Anesthesia Procedure Notes (Signed)
 Procedure Name: Intubation Date/Time: 10/13/2023 7:35 AM  Performed by: Obadiah Reyes BROCKS, CRNAPre-anesthesia Checklist: Patient identified, Emergency Drugs available, Suction available and Patient being monitored Patient Re-evaluated:Patient Re-evaluated prior to induction Oxygen Delivery Method: Circle System Utilized Preoxygenation: Pre-oxygenation with 100% oxygen Induction Type: IV induction Ventilation: Mask ventilation without difficulty Laryngoscope Size: Miller and 2 Grade View: Grade I Tube type: Oral Tube size: 7.5 mm Number of attempts: 1 Airway Equipment and Method: Stylet and Oral airway Placement Confirmation: ETT inserted through vocal cords under direct vision, positive ETCO2 and breath sounds checked- equal and bilateral Secured at: 21 cm Tube secured with: Tape Dental Injury: Teeth and Oropharynx as per pre-operative assessment

## 2023-10-13 NOTE — Op Note (Signed)
 Brad Tran ([id number])  Operative Report   Date 10/13/2023  PREOPERATIVE DIAGNOSIS: Left direct Inguinal hernia  POSTOPERATIVE DIAGNOSIS: Same  PROCEDURE:  Robotic Left Inguinal Hernia Repair with Mesh (Bard 3D Midweight XL Left Polypropylene Mesh)    SURGEON: Cordella Idler, MD  ANESTHESIA: General Anesthesia    INTRAOPERATIVE FINDINGS: Large direct left inguinal hernia containing fat  IMPLANTS: Implant Name Type Inv. Item Serial No. Manufacturer Lot No. LRB No. Used Action  MESH 3DMAX MID 5X7 LT MARSHA - [id number] Mesh General MESH 3DMAX MID 5X7 LT XLRG  [employer] BARD ACCESS [id number] Left 1 Implanted    ESTIMATED BLOOD LOSS: Minimal  COMPLICATIONS: None  SPECIMENS: None  OPERATIVE INDICATIONS: Pt is a 58 y.o. male who presents with a left inguinal hernia.  The patient desires definitive repair.  The procedure's risks, benefits, and alternatives were explained to the patient.  Risks, including the risks of bleeding, infection, need for mesh removal, and potential for hernia recurrence, were discussed.  The patient agreed to proceed and signed informed consent in front of a witness.   DESCRIPTION OF PROCEDURE: After preoperatively identifying the patient in holding, the patient was brought to the operating room and placed supine on the operating room table.  Both arms were tucked and padded to avoid potential nerve injury.  Sequential Compression Devices were placed bilaterally.  After induction of general anesthesia, the patient was given the appropriate perioperative antibiotics.  The abdomen was prepped and draped in a typical sterile fashion.  A JACHO approved time out, where the name of the patient, the operation, and the intended site were confirmed. The abdomen was accessed with a Veress needle via the standard drop technique in the LUQ at Palmer's point.  Insufflation was established.  An 8 mm optical port was placed under direct visualization in the RLQ.  A camera was  introduced into the abdomen, and a thorough inspection of the abdomen was performed to confirm there was no additional pathology.  Two additional 8 mm robotic ports were placed laterally under direct visualization, one superior to the umbilicus and another in the RLQ. The patient was then placed into 15-degree Trendelenburg position to facilitate examination of the bilateral inguinal spaces.  A thorough inspection of the abdomen was undertaken.  A left inguinal hernia was identified and amenable to robotic repair.       The [employer] robot was brought into the field and docked.  An 8 mm 30-degree scope was placed in the mid-abdominal port.  The peritoneum was taken down 6 cm superior to the hernia from the anterior abdominal wall, and dissection was taken inferiorly towards the hernia.  Medial to the epigastric vessels, the parietal compartment was dissected and completed to visualize the rectus muscle.  This dissection was carried down to the symphysis pubis and obturator foramen.  The retropubic space was dissected to expose at least 2 cm contralateral to the midline.  Cooper's ligament was exposed and cleared at least 2 cm below the ligament to ensure adequate space for the inferior border of the mesh.  Hesselbach's triangle was cleared, assessing for a direct hernia. There was a large, fat-containing direct hernia. The direct hernia was reduced, dissecting the contents away from the border of the transversalis fascia.  Any herniated femoral contents were reduced as well.  Lateral to the epigastric vessels, the dissection was carried out into the visceral compartment, continuing in the true preperitoneal plane.  The cord structures were parietalized completely, allowing for continuous visualization  of the reflected peritoneum with the line originating 2 cm below Cooper's medially and across the psoas muscle in the lateral compartment.  The internal ring was interrogated for a cord lipoma.  A small cord lipoma  was reduced to the retroperitoneum and seated dorsal to the preperitoneal mesh.  Having achieved a complete dissection with a critical view of the entire myopectineal orifice, a piece of Bard 3D Midweight XL Left Polypropylene Mesh was introduced into the field.  It was centered at the iliopubic tract, with the medial side crossing the midline and the inferior edge positioned 2 cm below Cooper's ligament.  With complete coverage of the myopectineal orifice, the inferior edge of the peritoneum was posterior and inferior to the mesh.  The lateral aspect of the mesh extended 3-5 cm beyond the lateral edge of the psoas.  Cephalad retraction of the peritoneal flap did not cause lifting of the inferior mesh edge or cord structures.  The mesh was fixated using an interrupted 3-0 Vicryl suture placed to the ipsilateral Coopers ligament.  The peritoneal flap was closed with a running 3-0 Stratafix barbed suture. Hemostasis was assured in the entirety of the abdomen.  The two lateral ports were removed under direct visualization.  The abdomen was desufflated under direct visualization.  The port sites were closed, local anesthetic was infiltrated, and Dermabond was applied.  After confirming twice that the sponge, needle, and instrument counts were correct, the procedure was terminated, the patient was extubated and transferred to the recovery room in stable condition.     DISPOSITION: Stable to PACU.   Electronically Signed By: Cordella DELENA Idler 10/13/2023 9:05 AM

## 2023-10-13 NOTE — H&P (Signed)
     Brad Tran 12/11/1965  [id number].    HPI:  58 y/o M w/ a left inguinal hernia who presents for elective repair. He reports that he is in his usual state of health and denies any recent changes in medication.   ROS: Review of Systems  Constitutional: Negative.   HENT: Negative.    Eyes: Negative.   Respiratory: Negative.    Cardiovascular: Negative.   Gastrointestinal: Negative.   Genitourinary: Negative.   Musculoskeletal: Negative.   Skin: Negative.   Neurological: Negative.   Endo/Heme/Allergies: Negative.   Psychiatric/Behavioral: Negative.      Family History  Problem Relation Age of Onset   Heart attack Father    Stroke Father    Colon cancer Neg Hx    Esophageal cancer Neg Hx    Stomach cancer Neg Hx     Past Medical History:  Diagnosis Date   Hyperlipidemia    Hypertension    Impaired ambulation    Pre-diabetes    Right sided weakness    Stroke (HCC) 2016   Vitamin D  deficiency 06/2020    Past Surgical History:  Procedure Laterality Date   NO PAST SURGERIES      Social History:  reports that he has quit smoking. His smoking use included cigarettes. He has never used smokeless tobacco. He reports that he does not currently use alcohol. He reports that he does not use drugs.  Allergies: No Known Allergies  Medications Prior to Admission  Medication Sig Dispense Refill   amLODipine  (NORVASC ) 5 MG tablet Take 1 tablet (5 mg total) by mouth daily. 90 tablet 3   aspirin  325 MG tablet Take 1 tablet (325 mg total) by mouth daily. 100 tablet 2   atorvastatin  (LIPITOR ) 80 MG tablet Take 1 tablet (80 mg total) by mouth daily. (Patient taking differently: Take 80 mg by mouth every evening.) 90 tablet 3   metoprolol  tartrate (LOPRESSOR ) 50 MG tablet Take 1 tablet (50 mg total) by mouth 2 (two) times daily. 180 tablet 3    Physical Exam: Blood pressure (!) 155/92, pulse 71, temperature 98.2 F (36.8 C), temperature source Oral, resp. rate 15, height 5' 3  (1.6 m), weight 80.3 kg, SpO2 98%. Gen: male, NAD Abd: soft, non-distended, left side marked  No results found for this or any previous visit (from the past 48 hours). No results found.  Assessment/Plan 58 y/o M w/ a left inguinal hernia.  - Will proceed to the OR. We discussed the alternatives and potential risks of surgery, including but not limited to: bleeding, infection, damage to bowel or surrounding structures, mesh complications, chronic pain, hernia recurrence, and need for additional procedures. All questions were addressed and consent was obtained.    Cordella DELENA Polly Marlis [facility] 10/13/2023, 7:03 AM Please see Amion for pager number during day hours 7:00am-4:30pm or 7:00am -11:30am on weekends

## 2023-10-13 NOTE — Transfer of Care (Signed)
 Immediate Anesthesia Transfer of Care Note  Patient: Brad Tran  Procedure(s) Performed: REPAIR, HERNIA, INGUINAL, ROBOT-ASSISTED, LAPAROSCOPIC, USING MESH (Left)  Patient Location: PACU  Anesthesia Type:General  Level of Consciousness: sedated and responds to stimulation  Airway & Oxygen Therapy: Patient Spontanous Breathing and Patient connected to nasal cannula oxygen  Post-op Assessment: Report given to RN and Post -op Vital signs reviewed and stable  Post vital signs: Reviewed  Last Vitals:  Vitals Value Taken Time  BP 133/83 10/13/23 09:18  Temp 35.6 C 10/13/23 09:18  Pulse 58 10/13/23 09:23  Resp 18 10/13/23 09:23  SpO2 100 % 10/13/23 09:23  Vitals shown include unfiled device data.  Last Pain:  Vitals:   10/13/23 0918  TempSrc:   PainSc: Asleep         Complications: No notable events documented.

## 2023-10-13 NOTE — Anesthesia Procedure Notes (Addendum)
 Anesthesia Regional Block: TAP block   Pre-Anesthetic Checklist: , timeout performed,  Correct Patient, Correct Site, Correct Laterality,  Correct Procedure, Correct Position, site marked,  Risks and benefits discussed,  Surgical consent,  Pre-op evaluation,  At surgeon's request and post-op pain management  Laterality: Left and Right  Prep: chloraprep       Needles:  Injection technique: Single-shot  Needle Type: Echogenic Stimulator Needle     Needle Length: 9cm  Needle Gauge: 21     Additional Needles:   Procedures:,,,, ultrasound used (permanent image in chart),,    Narrative:  Start time: 10/13/2023 7:07 AM End time: 10/13/2023 7:16 AM Injection made incrementally with aspirations every 5 mL.  Performed by: Personally  Anesthesiologist: Peggye Delon Brunswick, MD  Additional Notes: Discussed risks and benefits of nerve block including, but not limited to, prolonged and/or permanent nerve injury involving sensory and/or motor function. Monitors were applied and a time-out was performed. The nerve and associated structures were visualized under ultrasound guidance. After negative aspiration, local anesthetic was slowly injected around the nerve. There was no evidence of high pressure during the procedure. There were no paresthesias. VSS remained stable and the patient tolerated the procedure well.

## 2023-10-16 ENCOUNTER — Encounter (HOSPITAL_COMMUNITY): Payer: Self-pay | Admitting: General Surgery

## 2024-03-11 ENCOUNTER — Ambulatory Visit: Payer: Self-pay | Admitting: Nurse Practitioner

## 2024-03-15 ENCOUNTER — Other Ambulatory Visit (HOSPITAL_COMMUNITY): Payer: Self-pay

## 2024-04-04 ENCOUNTER — Ambulatory Visit: Payer: Self-pay | Admitting: Nurse Practitioner

## 2024-04-04 VITALS — BP 135/80 | HR 75 | Wt 177.0 lb

## 2024-04-04 DIAGNOSIS — R7303 Prediabetes: Secondary | ICD-10-CM

## 2024-04-04 DIAGNOSIS — I10 Essential (primary) hypertension: Secondary | ICD-10-CM

## 2024-04-04 DIAGNOSIS — Z1329 Encounter for screening for other suspected endocrine disorder: Secondary | ICD-10-CM

## 2024-04-04 DIAGNOSIS — Z13220 Encounter for screening for lipoid disorders: Secondary | ICD-10-CM

## 2024-04-04 DIAGNOSIS — Z125 Encounter for screening for malignant neoplasm of prostate: Secondary | ICD-10-CM

## 2024-04-04 LAB — POCT GLYCOSYLATED HEMOGLOBIN (HGB A1C)
HbA1c POC (<> result, manual entry): 5.9 % (ref 4.0–5.6)
HbA1c, POC (controlled diabetic range): 5.9 % (ref 0.0–7.0)
HbA1c, POC (prediabetic range): 5.9 % (ref 5.7–6.4)
Hemoglobin A1C: 5.9 % — AB (ref 4.0–5.6)

## 2024-04-04 NOTE — Progress Notes (Signed)
 Subjective   Patient ID: Brad Tran, male    DOB: [birth year], 58 y.o.   MRN: [id number]  Chief Complaint  Patient presents with   Follow-up    Patient stated he's doing better, a little more mobile     Referring provider: Oley Bascom RAMAN, NP  Dodd Schmid is a 58 y.o. male with Past Medical History: No date: Hyperlipidemia No date: Hypertension No date: Impaired ambulation No date: Pre-diabetes No date: Right sided weakness 2016: Stroke (HCC) 06/2020: Vitamin D  deficiency   HPI  Patient presents today for follow-up visit.  Overall he has been doing well.  He does not need refills today.  Vital signs in the office today are stable.  Patient does not have any new issues or concerns today.  A1c in office today is 5.9. Denies f/c/s, n/v/d, hemoptysis, PND, leg swelling Denies chest pain or edema     Allergies[1]  Immunization History  Administered Date(s) Administered   Influenza,inj,Quad PF,6+ Mos 03/17/2016, 03/05/2018, 03/07/2022   Tdap 03/17/2016    Tobacco History: Tobacco Use History[2] Counseling given: Not Answered   Outpatient Encounter Medications as of 04/04/2024  Medication Sig   amLODipine  (NORVASC ) 5 MG tablet Take 1 tablet (5 mg total) by mouth daily.   aspirin  325 MG tablet Take 1 tablet (325 mg total) by mouth daily.   metoprolol  tartrate (LOPRESSOR ) 50 MG tablet Take 1 tablet (50 mg total) by mouth 2 (two) times daily.   atorvastatin  (LIPITOR ) 80 MG tablet Take 1 tablet (80 mg total) by mouth daily. (Patient not taking: Reported on 04/04/2024)   No facility-administered encounter medications on file as of 04/04/2024.    Review of Systems  Review of Systems  Constitutional: Negative.   HENT: Negative.    Cardiovascular: Negative.   Gastrointestinal: Negative.   Allergic/Immunologic: Negative.   Neurological: Negative.   Psychiatric/Behavioral: Negative.       Objective:   BP 135/80 (BP Location: Left Arm, Patient Position: Sitting,  Cuff Size: Large)   Pulse 75   Wt 177 lb (80.3 kg)   SpO2 96%   BMI 31.35 kg/m   Wt Readings from Last 5 Encounters:  04/04/24 177 lb (80.3 kg)  10/13/23 177 lb (80.3 kg)  10/02/23 177 lb (80.3 kg)  09/08/23 180 lb (81.6 kg)  03/08/23 176 lb (79.8 kg)     Physical Exam Vitals and nursing note reviewed.  Constitutional:      General: He is not in acute distress.    Appearance: He is well-developed.  Cardiovascular:     Rate and Rhythm: Normal rate and regular rhythm.  Pulmonary:     Effort: Pulmonary effort is normal.     Breath sounds: Normal breath sounds.  Skin:    General: Skin is warm and dry.  Neurological:     Mental Status: He is alert and oriented to person, place, and time.       Assessment & Plan:   Prostate cancer screening -     PSA  Thyroid  disorder screen -     TSH  Lipid screening -     Lipid panel  Hypertension, unspecified type -     CBC -     Comprehensive metabolic panel with GFR  Prediabetes     Return in about 6 months (around 10/03/2024).   Bascom RAMAN Oley, NP 04/04/2024     [1] No Known Allergies [2]  Social History Tobacco Use  Smoking Status Former   Types: Cigarettes  Smokeless  Tobacco Never

## 2024-04-05 ENCOUNTER — Ambulatory Visit: Payer: Self-pay | Admitting: Nurse Practitioner

## 2024-04-05 LAB — COMPREHENSIVE METABOLIC PANEL WITH GFR
ALT: 37 IU/L (ref 0–44)
AST: 27 IU/L (ref 0–40)
Albumin: 4.6 g/dL (ref 3.8–4.9)
Alkaline Phosphatase: 64 IU/L (ref 47–123)
BUN/Creatinine Ratio: 15 (ref 9–20)
BUN: 19 mg/dL (ref 6–24)
Bilirubin Total: 0.5 mg/dL (ref 0.0–1.2)
CO2: 22 mmol/L (ref 20–29)
Calcium: 9.4 mg/dL (ref 8.7–10.2)
Chloride: 105 mmol/L (ref 96–106)
Creatinine, Ser: 1.27 mg/dL (ref 0.76–1.27)
Globulin, Total: 2.4 g/dL (ref 1.5–4.5)
Glucose: 92 mg/dL (ref 70–99)
Potassium: 4.3 mmol/L (ref 3.5–5.2)
Sodium: 140 mmol/L (ref 134–144)
Total Protein: 7 g/dL (ref 6.0–8.5)
eGFR: 65 mL/min/1.73

## 2024-04-05 LAB — CBC
Hematocrit: 45.1 % (ref 37.5–51.0)
Hemoglobin: 14.8 g/dL (ref 13.0–17.7)
MCH: 29.7 pg (ref 26.6–33.0)
MCHC: 32.8 g/dL (ref 31.5–35.7)
MCV: 90 fL (ref 79–97)
Platelets: 201 x10E3/uL (ref 150–450)
RBC: 4.99 x10E6/uL (ref 4.14–5.80)
RDW: 13.6 % (ref 11.6–15.4)
WBC: 5.1 x10E3/uL (ref 3.4–10.8)

## 2024-04-05 LAB — TSH: TSH: 1.03 u[IU]/mL (ref 0.450–4.500)

## 2024-04-05 LAB — LIPID PANEL
Chol/HDL Ratio: 3.5 ratio (ref 0.0–5.0)
Cholesterol, Total: 163 mg/dL (ref 100–199)
HDL: 47 mg/dL
LDL Chol Calc (NIH): 99 mg/dL (ref 0–99)
Triglycerides: 94 mg/dL (ref 0–149)
VLDL Cholesterol Cal: 17 mg/dL (ref 5–40)

## 2024-04-05 LAB — PSA: Prostate Specific Ag, Serum: 0.5 ng/mL (ref 0.0–4.0)

## 2024-09-30 ENCOUNTER — Ambulatory Visit: Payer: Self-pay | Admitting: Nurse Practitioner
